# Patient Record
Sex: Female | Born: 1950 | Race: Black or African American | Hispanic: No | Marital: Married | Smoking: Former smoker
Health system: Southern US, Community
[De-identification: ages and names within clinical notes are randomized; demographics above are authoritative.]

## PROBLEM LIST (undated history)

## (undated) DIAGNOSIS — I10 Essential (primary) hypertension: Secondary | ICD-10-CM

## (undated) DIAGNOSIS — A879 Viral meningitis, unspecified: Secondary | ICD-10-CM

## (undated) DIAGNOSIS — I671 Cerebral aneurysm, nonruptured: Secondary | ICD-10-CM

## (undated) DIAGNOSIS — T884XXA Failed or difficult intubation, initial encounter: Secondary | ICD-10-CM

## (undated) DIAGNOSIS — C3491 Malignant neoplasm of unspecified part of right bronchus or lung: Secondary | ICD-10-CM

## (undated) HISTORY — DX: Malignant neoplasm of unspecified part of right bronchus or lung: C34.91

## (undated) HISTORY — PX: WISDOM TOOTH EXTRACTION: SHX21

## (undated) HISTORY — PX: OOPHORECTOMY: SHX86

---
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
## 2008-11-06 ENCOUNTER — Ambulatory Visit: Payer: Self-pay | Admitting: Family Medicine

## 2010-06-30 ENCOUNTER — Ambulatory Visit: Payer: Self-pay | Admitting: Family Medicine

## 2010-11-28 ENCOUNTER — Ambulatory Visit: Payer: Self-pay | Admitting: Family Medicine

## 2012-04-05 ENCOUNTER — Ambulatory Visit: Payer: Self-pay | Admitting: Family Medicine

## 2012-09-15 DIAGNOSIS — L84 Corns and callosities: Secondary | ICD-10-CM | POA: Insufficient documentation

## 2012-09-15 DIAGNOSIS — I10 Essential (primary) hypertension: Secondary | ICD-10-CM | POA: Insufficient documentation

## 2013-02-04 ENCOUNTER — Emergency Department: Payer: Self-pay | Admitting: Emergency Medicine

## 2013-02-04 LAB — CBC
HCT: 35.9 % (ref 35.0–47.0)
HGB: 12.3 g/dL (ref 12.0–16.0)
MCH: 34.4 pg — ABNORMAL HIGH (ref 26.0–34.0)
MCV: 100 fL (ref 80–100)
Platelet: 276 10*3/uL (ref 150–440)
RDW: 12.6 % (ref 11.5–14.5)

## 2013-02-04 LAB — COMPREHENSIVE METABOLIC PANEL
Albumin: 3.8 g/dL (ref 3.4–5.0)
BUN: 12 mg/dL (ref 7–18)
Bilirubin,Total: 0.3 mg/dL (ref 0.2–1.0)
Calcium, Total: 8.9 mg/dL (ref 8.5–10.1)
Chloride: 107 mmol/L (ref 98–107)
Creatinine: 0.82 mg/dL (ref 0.60–1.30)
EGFR (African American): >60
EGFR (Non-African Amer.): >60
Osmolality: 276 (ref 275–301)
SGOT(AST): 28 U/L (ref 15–37)
SGPT (ALT): 36 U/L (ref 12–78)
Total Protein: 7.3 g/dL (ref 6.4–8.2)

## 2013-04-24 DIAGNOSIS — E7800 Pure hypercholesterolemia, unspecified: Secondary | ICD-10-CM | POA: Insufficient documentation

## 2013-08-01 DIAGNOSIS — E785 Hyperlipidemia, unspecified: Secondary | ICD-10-CM | POA: Insufficient documentation

## 2016-05-05 ENCOUNTER — Other Ambulatory Visit: Payer: Self-pay | Admitting: Family Medicine

## 2016-05-05 DIAGNOSIS — Z780 Asymptomatic menopausal state: Secondary | ICD-10-CM

## 2016-07-19 ENCOUNTER — Emergency Department: Payer: Medicare HMO

## 2016-07-19 ENCOUNTER — Emergency Department
Admission: EM | Admit: 2016-07-19 | Discharge: 2016-07-19 | Disposition: A | Discharge status: Home / Self Care | Payer: Medicare HMO | Attending: Emergency Medicine | Admitting: Emergency Medicine

## 2016-07-19 ENCOUNTER — Encounter: Payer: Self-pay | Admitting: Emergency Medicine

## 2016-07-19 DIAGNOSIS — Y9301 Activity, walking, marching and hiking: Secondary | ICD-10-CM | POA: Insufficient documentation

## 2016-07-19 DIAGNOSIS — S0083XA Contusion of other part of head, initial encounter: Secondary | ICD-10-CM | POA: Insufficient documentation

## 2016-07-19 DIAGNOSIS — Y998 Other external cause status: Secondary | ICD-10-CM | POA: Diagnosis not present

## 2016-07-19 DIAGNOSIS — W108XXA Fall (on) (from) other stairs and steps, initial encounter: Secondary | ICD-10-CM | POA: Insufficient documentation

## 2016-07-19 DIAGNOSIS — F17200 Nicotine dependence, unspecified, uncomplicated: Secondary | ICD-10-CM | POA: Insufficient documentation

## 2016-07-19 DIAGNOSIS — S0990XA Unspecified injury of head, initial encounter: Secondary | ICD-10-CM | POA: Diagnosis present

## 2016-07-19 DIAGNOSIS — Y9222 Religious institution as the place of occurrence of the external cause: Secondary | ICD-10-CM | POA: Insufficient documentation

## 2016-07-19 HISTORY — DX: Viral meningitis, unspecified: A87.9

## 2016-07-19 HISTORY — DX: Cerebral aneurysm, nonruptured: I67.1

## 2016-07-19 NOTE — ED Provider Notes (Signed)
[facility] Emergency Department Provider Note   ____________________________________________   First MD Initiated Contact with Patient 07/19/16 1801     (approximate)  I have reviewed the triage vital signs and the nursing notes.   HISTORY  Chief Complaint Fall    HPI Connie Powers is a 66 y.o. female here for evaluation of head injury. Patient was walking up steps holding to food trays, she could not see the steps and tripped on the last one. She fell forward and struck the wall leaving a small indentation in the wall, with swelling and tenderness over her right forehead  Did not lose consciousness. No neck pain. Take aspirin daily. No other blood thinners. No numbness tingling or weakness. No nausea or vomiting. Denies having any headache at this time, reports she just feels sore over the frontal forehead were swollen. No bleeding  No preceding seeding symptoms, no chest pain or palpitations. No trouble breathing  Headache previously diagnosed aneurysm, which was repaired surgically   Past Medical History:  Diagnosis Date  . Brain aneurysm   . Viral meningitis     There are no active problems to display for this patient.   Past Surgical History:  Procedure Laterality Date  . ABDOMINAL HYSTERECTOMY      Prior to Admission medications   Not on File    Allergies Patient has no known allergies.  No family history on file.  Social History Social History  Substance Use Topics  . Smoking status: Current Every Day Smoker  . Smokeless tobacco: Never Used  . Alcohol use Yes     Comment: social    Review of Systems Constitutional: No fever/chills Eyes: No visual changes. ENT: No sore throat. Cardiovascular: Denies chest pain. Respiratory: Denies shortness of breath. Gastrointestinal: No abdominal pain.  No nausea, no vomiting.  No diarrhea.  No constipation. Musculoskeletal: Negative for back pain. No neck pain or other injury to  the arms or legs. Skin: Negative for rash. Neurological: Negative for focal weakness or numbness.  10-point ROS otherwise negative.  ____________________________________________   PHYSICAL EXAM:  VITAL SIGNS: ED Triage Vitals  Enc Vitals Group     BP 07/19/16 1614 (!) 193/70     Pulse Rate 07/19/16 1614 80     Resp 07/19/16 1614 18     Temp 07/19/16 1614 98.6 F (37 C)     Temp Source 07/19/16 1614 Oral     SpO2 07/19/16 1614 96 %     Weight 07/19/16 1615 170 lb (77.1 kg)     Height 07/19/16 1615 '5\' 4"'$  (1.626 m)     Head Circumference --      Peak Flow --      Pain Score 07/19/16 1620 4     Pain Loc --      Pain Edu? --      Excl. in [place]? --    Systolic blood pressure 778E, on recheck prior her to discharge  Constitutional: Alert and oriented. Well appearing and in no acute distress. Eyes: Conjunctivae are normal. PERRL. EOMI. Head: Atraumatic except for a mild edema over the right forehead. Nose: No congestion/rhinnorhea. Mouth/Throat: Mucous membranes are moist.  Oropharynx non-erythematous. Neck: No stridor.  No cervical tenderness Cardiovascular: Normal rate, regular rhythm. Grossly normal heart sounds.  Good peripheral circulation. Respiratory: Normal respiratory effort.  No retractions. Lungs CTAB. Gastrointestinal: Soft and nontender. No distention.  Moves all extremities with 5 over 5 strength without pain. Musculoskeletal: No lower extremity tenderness  nor edema.  No joint effusions. Neurologic:  Normal speech and language. No gross focal neurologic deficits are appreciated. No gait instability. Skin:  Skin is warm, dry and intact. No rash noted. Psychiatric: Mood and affect are normal. Speech and behavior are normal.  ____________________________________________   LABS (all labs ordered are listed, but only abnormal results are displayed)  Labs Reviewed - No data to  display ____________________________________________  EKG   ____________________________________________  RADIOLOGY  Ct Head Wo Contrast  Result Date: 07/19/2016 CLINICAL DATA:  66 year old female status post fall on stairs, struck right forehead. Initial encounter. Personal history of intracranial aneurysm clipping. EXAM: CT HEAD WITHOUT CONTRAST TECHNIQUE: Contiguous axial images were obtained from the base of the skull through the vertex without intravenous contrast. COMPARISON:  Neck CT 02/04/2013. FINDINGS: Brain: No midline shift, mass effect, or evidence of intracranial mass lesion. No acute intracranial hemorrhage identified. No ventriculomegaly. Unusual appearing but stable surgical clip type device at the left ICA terminus. There is a surgical clip or metallic object also just inside the inner table of the skull at the posterior craniotomy site. No cortically based acute infarct identified. Left anterior temporal lobe chronic encephalomalacia. Vascular: Mild Calcified atherosclerosis at the skull base. Skull: Previous left frontotemporal craniotomy. No acute osseous abnormality identified. Sinuses/Orbits: Mild paranasal sinus mucosal thickening. Tympanic cavities and mastoids are clear. Other: Broad-based right forehead scalp hematoma measuring up to 7 mm in thickness. Underlying right frontal bone intact. Negative visible orbits soft tissues. Dense surgical clip or retained metallic foreign body along the anterior left external auditory canal. No other acute scalp soft tissue finding. IMPRESSION: 1. No acute traumatic injury to the brain identified. 2. Sequelae of left ICA terminus region aneurysm surgery with left anterior temporal lobe encephalomalacia. 3. Right scalp hematoma without underlying fracture. Electronically Signed   By: Genevie Ann M.D.   On: 07/19/2016 17:00    ____________________________________________   PROCEDURES  Procedure(s) performed: None  Procedures  Critical  Care performed: No  ____________________________________________   INITIAL IMPRESSION / ASSESSMENT AND PLAN / ED COURSE  Pertinent labs & imaging results that were available during my care of the patient were reviewed by me and considered in my medical decision making (see chart for details).  Fall, mechanical. Struck right forehead. CT scan negative for acute injury, other than small scalp hematoma. Patient awake alert, no signs or symptoms of concussion or major injury.  Return precautions and treatment recommendations and follow-up discussed with the patient who is agreeable with the plan.       ____________________________________________   FINAL CLINICAL IMPRESSION(S) / ED DIAGNOSES  Final diagnoses:  Forehead contusion, initial encounter      NEW MEDICATIONS STARTED DURING THIS VISIT:  New Prescriptions   No medications on file     Note:  This document was prepared using Dragon voice recognition software and may include unintentional dictation errors.     Delman Kitten, MD 07/19/16 [phone]

## 2016-07-19 NOTE — Discharge Instructions (Signed)
You were seen in the Emergency Department (ED) today for a head injury.  Based on your evaluation, you may have sustained a concussion (or bruise) to your brain.  If you had a CT scan done, it did not show any evidence of serious injury or bleeding.    Return to the emergency department or follow-up with your primary care doctor if your symptoms are not improving over this time.  Signs of a more serious head injury include vomiting, severe headache, excessive sleepiness or confusion, fever, and weakness or numbness in your face, arms or legs.  Return immediately to the Emergency Department if you experience any of these more concerning symptoms.

## 2016-07-19 NOTE — ED Triage Notes (Addendum)
Pt states that she was walking [place] the steps today at [employer] and fell  hitting her head on the wall. Patient denies LOC, Nausea or vomiting. Pt has hematoma on the right side of her forehead. Pt denies headache. Pt takes 81 mg ASA.

## 2016-07-28 ENCOUNTER — Other Ambulatory Visit: Payer: Self-pay | Admitting: Family Medicine

## 2016-07-28 DIAGNOSIS — Z1231 Encounter for screening mammogram for malignant neoplasm of breast: Secondary | ICD-10-CM

## 2016-08-11 ENCOUNTER — Ambulatory Visit
Admission: RE | Admit: 2016-08-11 | Discharge: 2016-08-11 | Disposition: A | Discharge status: Home / Self Care | Payer: Medicare HMO | Source: Ambulatory Visit | Attending: Family Medicine | Admitting: Family Medicine

## 2016-08-11 DIAGNOSIS — Z780 Asymptomatic menopausal state: Secondary | ICD-10-CM | POA: Diagnosis present

## 2016-08-11 DIAGNOSIS — Z1231 Encounter for screening mammogram for malignant neoplasm of breast: Secondary | ICD-10-CM | POA: Diagnosis present

## 2017-04-30 ENCOUNTER — Encounter: Payer: Self-pay | Admitting: *Deleted

## 2017-05-03 ENCOUNTER — Ambulatory Visit
Admission: RE | Admit: 2017-05-03 | Discharge: 2017-05-03 | Disposition: A | Discharge status: Home / Self Care | Payer: Medicare HMO | Source: Ambulatory Visit | Attending: Unknown Physician Specialty | Admitting: Unknown Physician Specialty

## 2017-05-03 ENCOUNTER — Encounter
Admission: RE | Disposition: A | Discharge status: Home / Self Care | Payer: Self-pay | Source: Ambulatory Visit | Attending: Unknown Physician Specialty

## 2017-05-03 DIAGNOSIS — Z1211 Encounter for screening for malignant neoplasm of colon: Secondary | ICD-10-CM | POA: Diagnosis not present

## 2017-05-03 DIAGNOSIS — Z538 Procedure and treatment not carried out for other reasons: Secondary | ICD-10-CM | POA: Diagnosis not present

## 2017-05-03 LAB — URINE DRUG SCREEN, QUALITATIVE (ARMC ONLY)
Amphetamines, Ur Screen: NOT DETECTED
BARBITURATES, UR SCREEN: NOT DETECTED
BENZODIAZEPINE, UR SCRN: NOT DETECTED
CANNABINOID 50 NG, UR ~~LOC~~: NOT DETECTED
Cocaine Metabolite,Ur ~~LOC~~: POSITIVE — AB
MDMA (Ecstasy)Ur Screen: NOT DETECTED
METHADONE SCREEN, URINE: NOT DETECTED
Opiate, Ur Screen: NOT DETECTED
Phencyclidine (PCP) Ur S: NOT DETECTED
TRICYCLIC, UR SCREEN: NOT DETECTED

## 2017-05-03 SURGERY — COLONOSCOPY WITH PROPOFOL
Anesthesia: General

## 2017-05-03 MED ORDER — SODIUM CHLORIDE 0.9 % IV SOLN: INTRAVENOUS | Status: DC

## 2017-07-28 ENCOUNTER — Other Ambulatory Visit: Payer: Self-pay | Admitting: Specialist

## 2017-07-28 DIAGNOSIS — R918 Other nonspecific abnormal finding of lung field: Secondary | ICD-10-CM

## 2017-08-09 ENCOUNTER — Encounter
Admission: RE | Admit: 2017-08-09 | Discharge: 2017-08-09 | Disposition: A | Discharge status: Home / Self Care | Payer: Medicare HMO | Source: Ambulatory Visit | Attending: Specialist | Admitting: Specialist

## 2017-08-09 DIAGNOSIS — R918 Other nonspecific abnormal finding of lung field: Secondary | ICD-10-CM | POA: Diagnosis present

## 2017-08-09 LAB — GLUCOSE, CAPILLARY: Glucose-Capillary: 107 mg/dL — ABNORMAL HIGH (ref 65–99)

## 2017-08-09 MED ORDER — FLUDEOXYGLUCOSE F - 18 (FDG) INJECTION
9.1000 | Freq: Once | INTRAVENOUS | Status: AC | PRN
  Administered 2017-08-09: 9.06 via INTRAVENOUS

## 2017-09-02 ENCOUNTER — Other Ambulatory Visit
Admission: RE | Admit: 2017-09-02 | Discharge: 2017-09-02 | Disposition: A | Discharge status: Home / Self Care | Payer: Medicare HMO | Source: Ambulatory Visit | Attending: Internal Medicine | Admitting: Internal Medicine

## 2017-09-02 ENCOUNTER — Other Ambulatory Visit: Payer: Self-pay | Admitting: Internal Medicine

## 2017-09-02 ENCOUNTER — Ambulatory Visit: Payer: Medicare HMO | Admitting: Internal Medicine

## 2017-09-02 ENCOUNTER — Encounter: Payer: Self-pay | Admitting: Internal Medicine

## 2017-09-02 VITALS — BP 126/70 | HR 93 | Resp 16 | Ht 64.0 in | Wt 180.0 lb

## 2017-09-02 DIAGNOSIS — R590 Localized enlarged lymph nodes: Secondary | ICD-10-CM

## 2017-09-02 DIAGNOSIS — F17210 Nicotine dependence, cigarettes, uncomplicated: Secondary | ICD-10-CM

## 2017-09-02 NOTE — Patient Instructions (Addendum)
Will set up a bronchoscopy to perform a lung biopsy.  Your risks with the procedure will be lower if you stop smoking.   --Quitting smoking is the most important thing that you can do for your health.  --Quitting smoking will have greater affect on your health than any medicine that we can give you.

## 2017-09-02 NOTE — Progress Notes (Signed)
[name]      Assessment and Plan:  Mediastinal lymphadenopathy with lung nodules. -Right paratracheal lymphadenopathy, will plan for EBUS bronchoscopy, and lower lobe washings. -We will send for ACE level, Anka, rheumatoid factor. -Follow-up 1 week after procedure for results.  Nicotine abuse. - Discussed importance of smoke cessation, spent greater than 3 minutes in discussion. -Discussed that continued smoking will increase her risk of complications with bronchoscopy.   Date: 09/02/2017  MRN# [id number] Connie Powers November 03, 1950    Connie Powers is a 67 y.o. old female seen in consultation for chief complaint of:    Chief Complaint  Patient presents with  . Consult    Referred by Dr. Raul Del for eval bronchoscopy.  . Cough    white mucus  . Shortness of Breath    with exertion  . Wheezing    HPI:   The patient is a 67 year old female she follows with Dr. Raul Del she has a history of smoking with moderate COPD.  She was found to have a few small nodules seen on CT, as well as a suspicious area of 2 cm mediastinal lymphadenopathy. Her primary doctor noted that she was wheezing and thus referred to pulmonary. She denies weight loss, she has occasional cough, some mucus, no hemoptysis.  She has occasional heartburn, controlled by diet. Denies sinus drainage. Denies rash. No family history of sarcoid.  She has occasional dyspnea, but not often. She can push her husband in a wheelchair at the hospital and be ok. Other times she has dyspnea walking in her home, she does not use inhalers.  She is not diabetic no cardiac problems.  She has had a colonoscopy, and mammograms.   She is smoking less than half a ppd. She has never quit before, but she is now down to 4 cigs per day, and is trying to quit by cutting down. She used to smoke a ppd.   Imaging personally reviewed CT/PET scan 08/09/17; there is a area of right paratracheal  lymphadenopathy elevated SUV of 7.1 lungs show mildly reduced volume, mild bibasilar atelectasis.   PMHX:   Past Medical History:  Diagnosis Date  . Brain aneurysm   . Viral meningitis    Surgical Hx:  Past Surgical History:  Procedure Laterality Date  . OOPHORECTOMY Left   . surgical repair for brain tumor     Family Hx:  Family History  Problem Relation Age of Onset  . Breast cancer Neg Hx    Social Hx:   Social History   Tobacco Use  . Smoking status: Current Every Day Smoker  . Smokeless tobacco: Never Used  Substance Use Topics  . Alcohol use: Yes    Comment: social  . Drug use: Not on file   Medication:    Current Outpatient Medications:  .  aspirin EC 81 MG tablet, Take 81 mg by mouth daily., Disp: , Rfl:  .  umeclidinium-vilanterol (ANORO ELLIPTA) 62.5-25 MCG/INH AEPB, Inhale 1 puff into the lungs daily., Disp: , Rfl:    Allergies:  Patient has no known allergies.  Review of Systems: Gen:  Denies  fever, sweats, chills HEENT: Denies blurred vision, double vision. bleeds, sore throat Cvc:  No dizziness, chest pain. Resp:   Denies cough or sputum production, shortness of breath Gi: Denies swallowing difficulty, stomach pain. Gu:  Denies bladder incontinence, burning urine Ext:   No Joint pain, stiffness. Skin: No skin rash,  hives  Endoc:  No polyuria, polydipsia. Psych:  No depression, insomnia. Other:  All other systems were reviewed with the patient and were negative other that what is mentioned in the HPI.   Physical Examination:   VS: BP 126/70 (BP Location: Left Arm, Cuff Size: Normal)   Pulse 93   Resp 16   Ht 5\' 4"  (1.626 m)   Wt 180 lb (81.6 kg)   SpO2 99%   BMI 30.90 kg/m   General Appearance: No distress  Neuro:without focal findings,  speech normal,  HEENT: PERRLA, EOM intact.   Pulmonary: normal breath sounds, No wheezing.  CardiovascularNormal S1,S2.  No m/r/g.   Abdomen: Benign, Soft, non-tender. Renal:  No costovertebral  tenderness  GU:  No performed at this time. Endoc: No evident thyromegaly, no signs of acromegaly. Skin:   warm, no rashes, no ecchymosis  Extremities: normal, no cyanosis, clubbing.  Other findings:    LABORATORY PANEL:   CBC No results for input(s): WBC, HGB, HCT, PLT in the last 168 hours. ------------------------------------------------------------------------------------------------------------------  Chemistries  No results for input(s): NA, K, CL, CO2, GLUCOSE, BUN, CREATININE, CALCIUM, MG, AST, ALT, ALKPHOS, BILITOT in the last 168 hours.  Invalid input(s): GFRCGP ------------------------------------------------------------------------------------------------------------------  Cardiac Enzymes No results for input(s): TROPONINI in the last 168 hours. ------------------------------------------------------------  RADIOLOGY:  No results found.     Thank  you for the consultation and for allowing [facility] to assist in the care of your patient. Our recommendations are noted above.  Please contact us if we can be of further service.   Marda Stalker, MD.  Board Certified in Internal Medicine, Pulmonary Medicine, [place], and Sleep Medicine.  [facility] Office Number: [phone]  Patricia Pesa, M.D.  Merton Border, M.D  09/02/2017

## 2017-09-02 NOTE — H&P (View-Only) (Signed)
[facility] Consultation      Assessment and Plan:  Mediastinal lymphadenopathy with lung nodules. -Right paratracheal lymphadenopathy, will plan for EBUS bronchoscopy, and lower lobe washings. -We will send for ACE level, Anka, rheumatoid factor. -Follow-up 1 week after procedure for results.  Nicotine abuse. - Discussed importance of smoke cessation, spent greater than 3 minutes in discussion. -Discussed that continued smoking will increase her risk of complications with bronchoscopy.   Date: 09/02/2017  MRN# [id number] Connie Powers [birth year]    Connie Powers is a 67 y.o. old female seen in consultation for chief complaint of:    Chief Complaint  Patient presents with  . Consult    Referred by Dr. Raul Del for eval bronchoscopy.  . Cough    white mucus  . Shortness of Breath    with exertion  . Wheezing    HPI:   The patient is a 67 year old female she follows with Dr. Raul Del she has a history of smoking with moderate COPD.  She was found to have a few small nodules seen on CT, as well as a suspicious area of 2 cm mediastinal lymphadenopathy. Her primary doctor noted that she was wheezing and thus referred to pulmonary. She denies weight loss, she has occasional cough, some mucus, no hemoptysis.  She has occasional heartburn, controlled by diet. Denies sinus drainage. Denies rash. No family history of sarcoid.  She has occasional dyspnea, but not often. She can push her husband in a wheelchair at the hospital and be ok. Other times she has dyspnea walking in her home, she does not use inhalers.  She is not diabetic no cardiac problems.  She has had a colonoscopy, and mammograms.   She is smoking less than half a ppd. She has never quit before, but she is now down to 4 cigs per day, and is trying to quit by cutting down. She used to smoke a ppd.   Imaging personally reviewed CT/PET scan 08/09/17; there is a area of right paratracheal  lymphadenopathy elevated SUV of 7.1 lungs show mildly reduced volume, mild bibasilar atelectasis.   PMHX:   Past Medical History:  Diagnosis Date  . Brain aneurysm   . Viral meningitis    Surgical Hx:  Past Surgical History:  Procedure Laterality Date  . OOPHORECTOMY Left   . surgical repair for brain tumor     Family Hx:  Family History  Problem Relation Age of Onset  . Breast cancer Neg Hx    Social Hx:   Social History   Tobacco Use  . Smoking status: Current Every Day Smoker  . Smokeless tobacco: Never Used  Substance Use Topics  . Alcohol use: Yes    Comment: social  . Drug use: Not on file   Medication:    Current Outpatient Medications:  .  aspirin EC 81 MG tablet, Take 81 mg by mouth daily., Disp: , Rfl:  .  umeclidinium-vilanterol (ANORO ELLIPTA) 62.5-25 MCG/INH AEPB, Inhale 1 puff into the lungs daily., Disp: , Rfl:    Allergies:  Patient has no known allergies.  Review of Systems: Gen:  Denies  fever, sweats, chills HEENT: Denies blurred vision, double vision. bleeds, sore throat Cvc:  No dizziness, chest pain. Resp:   Denies cough or sputum production, shortness of breath Gi: Denies swallowing difficulty, stomach pain. Gu:  Denies bladder incontinence, burning urine Ext:   No Joint pain, stiffness. Skin: No skin rash,  hives  Endoc:  No polyuria, polydipsia. Psych:  No depression, insomnia. Other:  All other systems were reviewed with the patient and were negative other that what is mentioned in the HPI.   Physical Examination:   VS: BP 126/70 (BP Location: Left Arm, Cuff Size: Normal)   Pulse 93   Resp 16   Ht 5\' 4"  (1.626 m)   Wt 180 lb (81.6 kg)   SpO2 99%   BMI 30.90 kg/m   General Appearance: No distress  Neuro:without focal findings,  speech normal,  HEENT: PERRLA, EOM intact.   Pulmonary: normal breath sounds, No wheezing.  CardiovascularNormal S1,S2.  No m/r/g.   Abdomen: Benign, Soft, non-tender. Renal:  No costovertebral  tenderness  GU:  No performed at this time. Endoc: No evident thyromegaly, no signs of acromegaly. Skin:   warm, no rashes, no ecchymosis  Extremities: normal, no cyanosis, clubbing.  Other findings:    LABORATORY PANEL:   CBC No results for input(s): WBC, HGB, HCT, PLT in the last 168 hours. ------------------------------------------------------------------------------------------------------------------  Chemistries  No results for input(s): NA, K, CL, CO2, GLUCOSE, BUN, CREATININE, CALCIUM, MG, AST, ALT, ALKPHOS, BILITOT in the last 168 hours.  Invalid input(s): GFRCGP ------------------------------------------------------------------------------------------------------------------  Cardiac Enzymes No results for input(s): TROPONINI in the last 168 hours. ------------------------------------------------------------  RADIOLOGY:  No results found.     Thank  you for the consultation and for allowing [facility] to assist in the care of your patient. Our recommendations are noted above.  Please contact us if we can be of further service.   Marda Stalker, MD.  Board Certified in Internal Medicine, Pulmonary Medicine, [place], and Sleep Medicine.  [facility] Office Number: [phone]  Patricia Pesa, M.D.  Merton Border, M.D  09/02/2017

## 2017-09-03 ENCOUNTER — Telehealth: Payer: Self-pay | Admitting: *Deleted

## 2017-09-03 LAB — ANGIOTENSIN CONVERTING ENZYME: Angiotensin-Converting Enzyme: 32 U/L (ref 14–82)

## 2017-09-03 NOTE — Telephone Encounter (Signed)
EBUS scheduled for 09/09/2017 DX: mediastinal lymphadenopathy Provider:Ramachandran

## 2017-09-03 NOTE — Telephone Encounter (Signed)
Called and spoke with Quillian Quince at [facility]. Procedure code [phone] and [phone] is a valid and billable code which does not require PA.  Call Ref # [id number]. Rhonda J Cobb

## 2017-09-06 NOTE — Telephone Encounter (Signed)
LMOM for pt to return call to let her know that her PAT appt is 09/07/17 @1pm .

## 2017-09-07 ENCOUNTER — Other Ambulatory Visit: Payer: Self-pay

## 2017-09-07 ENCOUNTER — Encounter
Admission: RE | Admit: 2017-09-07 | Discharge: 2017-09-07 | Disposition: A | Discharge status: Home / Self Care | Payer: Medicare HMO | Source: Ambulatory Visit | Attending: Internal Medicine | Admitting: Internal Medicine

## 2017-09-07 DIAGNOSIS — Z79899 Other long term (current) drug therapy: Secondary | ICD-10-CM | POA: Diagnosis not present

## 2017-09-07 DIAGNOSIS — F17210 Nicotine dependence, cigarettes, uncomplicated: Secondary | ICD-10-CM | POA: Diagnosis not present

## 2017-09-07 DIAGNOSIS — Z8679 Personal history of other diseases of the circulatory system: Secondary | ICD-10-CM | POA: Diagnosis not present

## 2017-09-07 DIAGNOSIS — R590 Localized enlarged lymph nodes: Secondary | ICD-10-CM | POA: Diagnosis present

## 2017-09-07 DIAGNOSIS — C771 Secondary and unspecified malignant neoplasm of intrathoracic lymph nodes: Secondary | ICD-10-CM | POA: Diagnosis not present

## 2017-09-07 DIAGNOSIS — Z7982 Long term (current) use of aspirin: Secondary | ICD-10-CM | POA: Diagnosis not present

## 2017-09-07 DIAGNOSIS — I739 Peripheral vascular disease, unspecified: Secondary | ICD-10-CM | POA: Diagnosis not present

## 2017-09-07 DIAGNOSIS — J449 Chronic obstructive pulmonary disease, unspecified: Secondary | ICD-10-CM | POA: Diagnosis not present

## 2017-09-07 LAB — BASIC METABOLIC PANEL
ANION GAP: 7 (ref 5–15)
BUN: 6 mg/dL (ref 6–20)
CALCIUM: 8.8 mg/dL — AB (ref 8.9–10.3)
CO2: 26 mmol/L (ref 22–32)
Chloride: 107 mmol/L (ref 101–111)
Creatinine, Ser: 0.49 mg/dL (ref 0.44–1.00)
GFR calc Af Amer: >60 mL/min (ref >60)
Glucose, Bld: 123 mg/dL — ABNORMAL HIGH (ref 65–99)
Potassium: 3.5 mmol/L (ref 3.5–5.1)
SODIUM: 140 mmol/L (ref 135–145)

## 2017-09-07 LAB — CBC
HCT: 38.4 % (ref 35.0–47.0)
Hemoglobin: 13 g/dL (ref 12.0–16.0)
MCH: 35.7 pg — ABNORMAL HIGH (ref 26.0–34.0)
MCHC: 34 g/dL (ref 32.0–36.0)
MCV: 105.1 fL — AB (ref 80.0–100.0)
PLATELETS: 247 10*3/uL (ref 150–440)
RBC: 3.65 MIL/uL — AB (ref 3.80–5.20)
RDW: 13.3 % (ref 11.5–14.5)
WBC: 4.6 10*3/uL (ref 3.6–11.0)

## 2017-09-07 NOTE — Patient Instructions (Signed)
Your procedure is scheduled on: Thursday, April 11TH  Report to [facility]  To find out your arrival time please call [phone] between               1PM - 3PM on Wednesday, April 10TH  Remember: Instructions that are not followed completely may result in serious  medical risk, up to and including death, or upon the discretion of your surgeon  and anesthesiologist your surgery may need to be rescheduled.     _X__ 1. Do not eat food after midnight the night before your procedure.                 No gum chewing or hard candies.                  You may drink clear liquids up to 2 hours                 before you are scheduled to arrive for your surgery-                   DO not drink clear liquids within 2 hours of the start of your surgery.                  Clear Liquids include:  water, apple juice without pulp, clear carbohydrate                 drink such as Clearfast of Gartorade, Black Coffee or Tea (Do not add                 anything to coffee or tea).  __X__2.  On the morning of surgery brush your teeth with toothpaste and water,                       yoU may rinse your mouth with mouthwash if you wish.                            Do not swallow any  toothpaste of mouthwash.     _X__ 3.  No Alcohol for 24 hours before or after surgery.   _X__ 4.  Do Not Smoke or use e-cigarettes For 24 Hours Prior to Your Surgery.                 Do not use any chewable tobacco products for at least 6 hours prior to                 surgery.  ____  5.  Bring all medications with you on the day of surgery if instructed.   ____  6.  Notify your doctor if there is any change in your medical condition      (cold, fever, infections).     Do not wear jewelry, make-up, hairpins, clips or nail polish. Do not wear lotions, powders, or perfumes. You may wear deodorant. Do not shave 48 hours prior to surgery. Men may shave face and neck. Do not  bring valuables to the hospital.    [facility] is not responsible for any belongings or valuables.  Contacts, dentures or bridgework may not be worn into surgery. Leave your suitcase in the car. After surgery it may be brought to your room. For patients admitted to the hospital, discharge time is determined by your treatment team.   Patients discharged the day  of surgery will not be allowed to drive home.   Please read over the following fact sheets that you were given:   Preparing for surgery     ____ Take these medicines the morning of surgery with A SIP OF WATER:    1. Anoro inhaler.  Use in the morning and bring with you to the hospital  2.   3.   4.  5.  6.  ____ Fleet Enema (as directed)   _x___ TAKE A SHOWER IN THE MORNING BEFORE COMING TO [place]  _x___ Use inhalers on the day of surgery  ____ Stop ASPIRIN NOW!!  ____ Stop Anti-inflammatories NOW!!   ____ Stop supplements until after surgery.    ____ Bring C-Pap to the hospital.   [place]

## 2017-09-07 NOTE — Telephone Encounter (Signed)
Patient returning call  Confirmed appt for PAT 09/07/17 at 1pm

## 2017-09-07 NOTE — Telephone Encounter (Signed)
Called pt back to make sure she was informed where she needed to go for PAT and to take her meds with her.

## 2017-09-07 NOTE — Telephone Encounter (Signed)
LMTCB in regards to pre-assessment today at 1pm.

## 2017-09-08 NOTE — Care Management (Signed)
EKG reviewed and ok. No further consults needed.

## 2017-09-09 ENCOUNTER — Ambulatory Visit: Payer: Medicare HMO | Admitting: Anesthesiology

## 2017-09-09 ENCOUNTER — Encounter
Admission: RE | Disposition: A | Discharge status: Home / Self Care | Payer: Self-pay | Source: Ambulatory Visit | Attending: Internal Medicine

## 2017-09-09 ENCOUNTER — Other Ambulatory Visit: Payer: Self-pay

## 2017-09-09 ENCOUNTER — Ambulatory Visit
Admission: RE | Admit: 2017-09-09 | Discharge: 2017-09-09 | Disposition: A | Discharge status: Home / Self Care | Payer: Medicare HMO | Source: Ambulatory Visit | Attending: Internal Medicine | Admitting: Internal Medicine

## 2017-09-09 DIAGNOSIS — C771 Secondary and unspecified malignant neoplasm of intrathoracic lymph nodes: Secondary | ICD-10-CM | POA: Diagnosis not present

## 2017-09-09 DIAGNOSIS — F17210 Nicotine dependence, cigarettes, uncomplicated: Secondary | ICD-10-CM | POA: Insufficient documentation

## 2017-09-09 DIAGNOSIS — Z8679 Personal history of other diseases of the circulatory system: Secondary | ICD-10-CM | POA: Insufficient documentation

## 2017-09-09 DIAGNOSIS — R590 Localized enlarged lymph nodes: Secondary | ICD-10-CM

## 2017-09-09 DIAGNOSIS — J449 Chronic obstructive pulmonary disease, unspecified: Secondary | ICD-10-CM | POA: Insufficient documentation

## 2017-09-09 DIAGNOSIS — Z7982 Long term (current) use of aspirin: Secondary | ICD-10-CM | POA: Insufficient documentation

## 2017-09-09 DIAGNOSIS — Z79899 Other long term (current) drug therapy: Secondary | ICD-10-CM | POA: Insufficient documentation

## 2017-09-09 DIAGNOSIS — I739 Peripheral vascular disease, unspecified: Secondary | ICD-10-CM | POA: Insufficient documentation

## 2017-09-09 HISTORY — PX: ENDOBRONCHIAL ULTRASOUND: SHX5096

## 2017-09-09 HISTORY — DX: Failed or difficult intubation, initial encounter: T88.4XXA

## 2017-09-09 LAB — URINE DRUG SCREEN, QUALITATIVE (ARMC ONLY)
Amphetamines, Ur Screen: NOT DETECTED
Barbiturates, Ur Screen: NOT DETECTED
Benzodiazepine, Ur Scrn: NOT DETECTED
CANNABINOID 50 NG, UR ~~LOC~~: NOT DETECTED
COCAINE METABOLITE, UR ~~LOC~~: NOT DETECTED
MDMA (Ecstasy)Ur Screen: NOT DETECTED
Methadone Scn, Ur: NOT DETECTED
OPIATE, UR SCREEN: NOT DETECTED
Phencyclidine (PCP) Ur S: NOT DETECTED
TRICYCLIC, UR SCREEN: NOT DETECTED

## 2017-09-09 SURGERY — ENDOBRONCHIAL ULTRASOUND (EBUS)
Anesthesia: General

## 2017-09-09 MED ORDER — HYDRALAZINE HCL 20 MG/ML IJ SOLN: 10.0000 mg | Freq: Once | INTRAMUSCULAR | Status: DC

## 2017-09-09 MED ORDER — FENTANYL CITRATE (PF) 100 MCG/2ML IJ SOLN
INTRAMUSCULAR | Status: AC
Start: 2017-09-09 — End: 2017-09-09
  Filled 2017-09-09: qty 2

## 2017-09-09 MED ORDER — LACTATED RINGERS IV SOLN
INTRAVENOUS | Status: DC
  Administered 2017-09-09 (×2): via INTRAVENOUS

## 2017-09-09 MED ORDER — ESMOLOL HCL 100 MG/10ML IV SOLN
INTRAVENOUS | Status: AC
  Filled 2017-09-09: qty 10

## 2017-09-09 MED ORDER — MIDAZOLAM HCL 2 MG/2ML IJ SOLN
INTRAMUSCULAR | Status: AC
  Filled 2017-09-09: qty 2

## 2017-09-09 MED ORDER — LIDOCAINE HCL 2 % EX GEL
1.0000 "application " | Freq: Once | CUTANEOUS | Status: DC
  Filled 2017-09-09: qty 5

## 2017-09-09 MED ORDER — PROPOFOL 10 MG/ML IV BOLUS
INTRAVENOUS | Status: DC | PRN
  Administered 2017-09-09: 150 mg via INTRAVENOUS
  Administered 2017-09-09: 50 mg via INTRAVENOUS

## 2017-09-09 MED ORDER — PROPOFOL 10 MG/ML IV BOLUS
INTRAVENOUS | Status: AC
Start: 2017-09-09 — End: 2017-09-09
  Filled 2017-09-09: qty 20

## 2017-09-09 MED ORDER — LIDOCAINE HCL (CARDIAC) 20 MG/ML IV SOLN
INTRAVENOUS | Status: DC | PRN
  Administered 2017-09-09: 100 mg via INTRAVENOUS

## 2017-09-09 MED ORDER — FAMOTIDINE 20 MG PO TABS
20.0000 mg | ORAL_TABLET | Freq: Once | ORAL | Status: AC
  Administered 2017-09-09: 20 mg via ORAL

## 2017-09-09 MED ORDER — FAMOTIDINE 20 MG PO TABS
ORAL_TABLET | ORAL | Status: AC
  Administered 2017-09-09: 20 mg via ORAL
  Filled 2017-09-09: qty 1

## 2017-09-09 MED ORDER — BUTAMBEN-TETRACAINE-BENZOCAINE 2-2-14 % EX AERO
1.0000 | INHALATION_SPRAY | Freq: Once | CUTANEOUS | Status: DC
  Filled 2017-09-09: qty 20

## 2017-09-09 MED ORDER — SEVOFLURANE IN SOLN
RESPIRATORY_TRACT | Status: AC
  Filled 2017-09-09: qty 250

## 2017-09-09 MED ORDER — ROCURONIUM BROMIDE 100 MG/10ML IV SOLN
INTRAVENOUS | Status: DC | PRN
  Administered 2017-09-09: 30 mg via INTRAVENOUS

## 2017-09-09 MED ORDER — DEXAMETHASONE SODIUM PHOSPHATE 10 MG/ML IJ SOLN
INTRAMUSCULAR | Status: DC | PRN
  Administered 2017-09-09: 10 mg via INTRAVENOUS

## 2017-09-09 MED ORDER — SUGAMMADEX SODIUM 200 MG/2ML IV SOLN
INTRAVENOUS | Status: AC
  Filled 2017-09-09: qty 2

## 2017-09-09 MED ORDER — SUCCINYLCHOLINE CHLORIDE 20 MG/ML IJ SOLN
INTRAMUSCULAR | Status: DC | PRN
  Administered 2017-09-09: 120 mg via INTRAVENOUS

## 2017-09-09 MED ORDER — PHENYLEPHRINE HCL 0.25 % NA SOLN
1.0000 | Freq: Four times a day (QID) | NASAL | Status: DC | PRN
  Filled 2017-09-09: qty 15

## 2017-09-09 MED ORDER — LABETALOL HCL 5 MG/ML IV SOLN: 10.0000 mg | Freq: Once | INTRAVENOUS | Status: DC

## 2017-09-09 MED ORDER — FENTANYL CITRATE (PF) 100 MCG/2ML IJ SOLN: 25.0000 ug | INTRAMUSCULAR | Status: DC | PRN

## 2017-09-09 MED ORDER — OXYCODONE HCL 5 MG PO TABS: 5.0000 mg | ORAL_TABLET | Freq: Once | ORAL | Status: DC | PRN

## 2017-09-09 MED ORDER — ONDANSETRON HCL 4 MG/2ML IJ SOLN
INTRAMUSCULAR | Status: DC | PRN
  Administered 2017-09-09: 4 mg via INTRAVENOUS

## 2017-09-09 MED ORDER — FENTANYL CITRATE (PF) 100 MCG/2ML IJ SOLN
INTRAMUSCULAR | Status: DC | PRN
  Administered 2017-09-09 (×2): 50 ug via INTRAVENOUS

## 2017-09-09 MED ORDER — ROCURONIUM BROMIDE 50 MG/5ML IV SOLN
INTRAVENOUS | Status: AC
  Filled 2017-09-09: qty 1

## 2017-09-09 MED ORDER — SUCCINYLCHOLINE CHLORIDE 20 MG/ML IJ SOLN
INTRAMUSCULAR | Status: AC
  Filled 2017-09-09: qty 1

## 2017-09-09 MED ORDER — HYDRALAZINE HCL 20 MG/ML IJ SOLN
INTRAMUSCULAR | Status: AC
  Filled 2017-09-09: qty 1

## 2017-09-09 MED ORDER — SUGAMMADEX SODIUM 200 MG/2ML IV SOLN
INTRAVENOUS | Status: DC | PRN
  Administered 2017-09-09: 170 mg via INTRAVENOUS

## 2017-09-09 MED ORDER — ONDANSETRON HCL 4 MG/2ML IJ SOLN
INTRAMUSCULAR | Status: AC
  Filled 2017-09-09: qty 2

## 2017-09-09 MED ORDER — HYDRALAZINE HCL 20 MG/ML IJ SOLN
10.0000 mg | Freq: Once | INTRAMUSCULAR | Status: AC
  Administered 2017-09-09: 10 mg via INTRAVENOUS

## 2017-09-09 MED ORDER — LIDOCAINE HCL (PF) 1 % IJ SOLN
INTRAMUSCULAR | Status: AC
  Filled 2017-09-09: qty 5

## 2017-09-09 MED ORDER — OXYCODONE HCL 5 MG/5ML PO SOLN: 5.0000 mg | Freq: Once | ORAL | Status: DC | PRN

## 2017-09-09 MED ORDER — DEXAMETHASONE SODIUM PHOSPHATE 10 MG/ML IJ SOLN
INTRAMUSCULAR | Status: AC
  Filled 2017-09-09: qty 1

## 2017-09-09 MED ORDER — BUTAMBEN-TETRACAINE-BENZOCAINE 2-2-14 % EX AERO
INHALATION_SPRAY | CUTANEOUS | Status: AC
  Filled 2017-09-09: qty 5

## 2017-09-09 MED ORDER — ESMOLOL HCL 100 MG/10ML IV SOLN
INTRAVENOUS | Status: DC | PRN
  Administered 2017-09-09 (×3): 10 mg via INTRAVENOUS

## 2017-09-09 MED ORDER — MIDAZOLAM HCL 2 MG/2ML IJ SOLN
INTRAMUSCULAR | Status: DC | PRN
  Administered 2017-09-09: 2 mg via INTRAVENOUS

## 2017-09-09 NOTE — Anesthesia Procedure Notes (Signed)
Procedure Name: Intubation Date/Time: 09/09/2017 1:51 PM Performed by: Aline Brochure, CRNA Pre-anesthesia Checklist: Patient identified, Emergency Drugs available, Suction available and Patient being monitored Patient Re-evaluated:Patient Re-evaluated prior to induction Oxygen Delivery Method: Circle system utilized Preoxygenation: Pre-oxygenation with 100% oxygen Induction Type: IV induction Ventilation: Mask ventilation without difficulty Laryngoscope Size: McGraph and 3 Grade View: Grade I Tube type: Oral Tube size: 8.0 mm Number of attempts: 2 Airway Equipment and Method: Bougie stylet and Video-laryngoscopy Placement Confirmation: ETT inserted through vocal cords under direct vision,  positive ETCO2 and breath sounds checked- equal and bilateral Secured at: 22 cm Tube secured with: Tape Dental Injury: Teeth and Oropharynx as per pre-operative assessment  Difficulty Due To: Difficult Airway- due to large tongue and Difficult Airway- due to anterior larynx

## 2017-09-09 NOTE — Anesthesia Post-op Follow-up Note (Signed)
Anesthesia QCDR form completed.        

## 2017-09-09 NOTE — Procedures (Signed)
  [facility]            Bronchoscopy Note   FINDINGS/SUMMARY:   -Copious mucosal secretions throughout both airways which were suctioned. -EBUS guided needle sampling of right paratracheal lymphadenopathy. -Mild erythema throughout both airways, no endobronchial lesions were noted.  Random forceps biopsies taken of the right mainstem endobronchial mucosa. -Right middle lobe transbronchial brushings performed, followed by right middle lobe bronchoalveolar lavage.  Indication: Right paratracheal lymphadenopathy, suspected malignancy versus granulomatous disease. The patient (or their representative) was informed of the risks (including but not limited to bleeding, infection, respiratory failure, lung injury, tooth/oral injury) and benefits of the procedure and gave consent, see chart.   Pre-op diagnosis: Right paratracheal lymphadenopathy Post-op diagnosis: Same Estimated blood loss: 10 mL  Medications for procedure: Please see anesthesia note  Procedure description:  After obtaining informed consent, timeout was called to confirm the patient the procedure.  Patient was intubated and sedated by anesthesia, please see their notes for further details.  The EBUS bronchoscope was passed by the endotracheal tube to the carina, there was a moderate lymph node seen on the right paratracheal area.  Several passes were obtained with good returns. The EBUS scope was then removed, the regular white light bronchoscope was passed, an anatomical tumor was undertaken.  All airways were visualized, all segments were visualized, there was no obvious abnormalities other than moderate mucosal erythema, and copious mucosal secretions were suctioned. Random endobronchial forceps biopsies were taken x2 at the right mainstem proximally and distally.  Subsequently the bronchoscope was taken to the right middle lobe and wedged.  Transbronchial cytology brushings were taken from the right and left  subsegments of the right middle lobe.  Bronchoalveolar lavage was then performed in the right middle lobe.  The scope was then removed.   Condition post procedure: Stable   Complications: None evident  Deep Ashby Dawes, MD.  Board Certified in Internal Medicine, Pulmonary Medicine, [employer], and Sleep Medicine.  [facility] Office Number: [phone]  Patricia Pesa, M.D.  Cheral Marker, M.D  09/09/2017

## 2017-09-09 NOTE — Anesthesia Preprocedure Evaluation (Signed)
Anesthesia Evaluation  Patient identified by MRN, date of birth, ID band Patient awake    Reviewed: Allergy & Precautions, H&P , NPO status , Patient's Chart, lab work & pertinent test results  History of Anesthesia Complications Negative for: history of anesthetic complications  Airway Mallampati: III  TM Distance: >3 FB Neck ROM: full    Dental  (+) Chipped, Poor Dentition, Missing   Pulmonary neg shortness of breath, COPD, Current Smoker,           Cardiovascular Exercise Tolerance: Good (-) angina+ Peripheral Vascular Disease  (-) Past MI and (-) PND      Neuro/Psych negative neurological ROS  negative psych ROS   GI/Hepatic negative GI ROS, Neg liver ROS,   Endo/Other  negative endocrine ROS  Renal/GU      Musculoskeletal   Abdominal   Peds  Hematology negative hematology ROS (+)   Anesthesia Other Findings Past Medical History: No date: Brain aneurysm No date: Viral meningitis  Past Surgical History: No date: OOPHORECTOMY; Left 1969: surgical repair for brain tumor     Comment:  clips in head No date: WISDOM TOOTH EXTRACTION  BMI    Body Mass Index:  30.73 kg/m      Reproductive/Obstetrics negative OB ROS                             Anesthesia Physical Anesthesia Plan  ASA: III  Anesthesia Plan: General ETT   Post-op Pain Management:    Induction: Intravenous  PONV Risk Score and Plan: Ondansetron, Dexamethasone and Midazolam  Airway Management Planned: Oral ETT  Additional Equipment:   Intra-op Plan:   Post-operative Plan: Extubation in OR  Informed Consent: I have reviewed the patients History and Physical, chart, labs and discussed the procedure including the risks, benefits and alternatives for the proposed anesthesia with the patient or authorized representative who has indicated his/her understanding and acceptance.   Dental Advisory Given  Plan  Discussed with: Anesthesiologist, CRNA and Surgeon  Anesthesia Plan Comments: (Patient consented for risks of anesthesia including but not limited to:  - adverse reactions to medications - damage to teeth, lips or other oral mucosa - sore throat or hoarseness - Damage to heart, brain, lungs or loss of life  Patient voiced understanding.)        Anesthesia Quick Evaluation

## 2017-09-09 NOTE — Transfer of Care (Signed)
Immediate Anesthesia Transfer of Care Note  Patient: Connie Powers  Procedure(s) Performed: ENDOBRONCHIAL ULTRASOUND (N/A )  Patient Location: PACU  Anesthesia Type:General  Level of Consciousness: awake, alert  and oriented  Airway & Oxygen Therapy: Patient connected to face mask oxygen  Post-op Assessment: Post -op Vital signs reviewed and stable  Post vital signs: stable  Last Vitals:  Vitals Value Taken Time  BP 149/82 09/09/2017  2:48 PM  Temp    Pulse 95 09/09/2017  2:48 PM  Resp 24 09/09/2017  2:48 PM  SpO2 100 % 09/09/2017  2:48 PM  Vitals shown include unvalidated device data.  Last Pain:  Vitals:   09/09/17 1205  TempSrc: Temporal  PainSc: 0-No pain         Complications: No apparent anesthesia complications

## 2017-09-09 NOTE — Anesthesia Postprocedure Evaluation (Signed)
Anesthesia Post Note  Patient: Connie Powers  Procedure(s) Performed: ENDOBRONCHIAL ULTRASOUND (N/A )  Patient location during evaluation: PACU Anesthesia Type: General Level of consciousness: awake and alert Pain management: pain level controlled Vital Signs Assessment: post-procedure vital signs reviewed and stable Respiratory status: spontaneous breathing, nonlabored ventilation, respiratory function stable and patient connected to nasal cannula oxygen Cardiovascular status: blood pressure returned to baseline and stable Postop Assessment: no apparent nausea or vomiting Anesthetic complications: no     Last Vitals:  Vitals:   09/09/17 1547 09/09/17 1558  BP: (!) 176/73 (!) 171/77  Pulse: 78 82  Resp: 17 16  Temp:  36.7 C  SpO2: 92% 97%    Last Pain:  Vitals:   09/09/17 1558  TempSrc: Temporal  PainSc: 0-No pain                 Precious Haws Piscitello

## 2017-09-09 NOTE — Progress Notes (Signed)
Blood pressure 174/72 and oxygen sat on room air 93  Good with dr piscitello

## 2017-09-09 NOTE — Discharge Instructions (Signed)

## 2017-09-09 NOTE — Progress Notes (Signed)
Hydralazine 10mg  given for high blood pressure

## 2017-09-09 NOTE — Interval H&P Note (Signed)
History and Physical Interval Note:  09/09/2017 12:32 PM  Connie Powers  has presented today for surgery, with the diagnosis of MEDIASTINAL LYMPHADENOPATHY  The various methods of treatment have been discussed with the patient and family. After consideration of risks, benefits and other options for treatment, the patient has consented to  Procedure(s): ENDOBRONCHIAL ULTRASOUND (N/A) as a surgical intervention .  The patient's history has been reviewed, patient examined, no change in status, stable for surgery.  I have reviewed the patient's chart and labs.  Questions were answered to the patient's satisfaction.     Laverle Hobby

## 2017-09-10 ENCOUNTER — Encounter: Payer: Self-pay | Admitting: Internal Medicine

## 2017-09-10 LAB — ACID FAST SMEAR (AFB, MYCOBACTERIA)

## 2017-09-10 LAB — ACID FAST SMEAR (AFB): ACID FAST SMEAR - AFSCU2: NEGATIVE

## 2017-09-12 LAB — CULTURE, BAL-QUANTITATIVE: CULTURE: NORMAL — AB

## 2017-09-12 LAB — CULTURE, BAL-QUANTITATIVE W GRAM STAIN

## 2017-09-14 ENCOUNTER — Other Ambulatory Visit: Payer: Self-pay | Admitting: Pathology

## 2017-09-14 LAB — CYTOLOGY - NON PAP

## 2017-09-14 LAB — SURGICAL PATHOLOGY

## 2017-09-14 NOTE — Progress Notes (Unsigned)
Dt

## 2017-09-15 NOTE — Progress Notes (Addendum)
[facility] Consultation      Assessment and Plan:  Non-small cell lung cancer. -Status post EBUS bronchoscopy 09/09/17 with right paratracheal lymph node biopsy positive for lung cancer. -We will refer to oncology for further workup and treatment.  Nicotine abuse. - Discussed importance of smoke cessation, spent greater than 3 minutes in discussion.  COPD. -Patient will continue to follow-up with Dr. Raul Del.  ADDENDUM 09/16/17. Pt discussed at lung cancer conference, metastatic spread of primary lung cancer to right paratracheal node, primary site in the lung unknown. Plan for chemo/rads.    Date: 09/15/2017  MRN# [id number] Connie Powers December 26, 1950    Connie Powers is a 67 y.o. old female seen in consultation for chief complaint of:    Chief Complaint  Patient presents with  . Follow-up    pt here 1 week f/u bronchoscopy    HPI:   The patient is a 67 year old female she follows with Dr. Raul Del she has a history of smoking with moderate COPD.  She was noted to an isolated enlarged right paratracheal node which was positive on PET scan (initially found on CT lung screening at [facility])  She underwent EBUS bronchoscopy with needle sampling of the right paratracheal lymph node which was positive for METASTATIC NON-SMALL CELL CARCINOMA, FAVOR ADENOCARCINOMA.   Since her bronchoscopy she has felt well, she coughed for 1 day, had some mild chest pain, both resolved in about 24 hours. She did not have any fevers. She now feels that she recovered and is back to normal.   Imaging personally reviewed CT/PET scan 08/09/17; there is a area of right paratracheal lymphadenopathy elevated SUV of 7.1 lungs show mildly reduced volume, mild bibasilar atelectasis.  Outside CT chest report (images not available) [facility] cancer screening 05/27/17 Impression:   1. A few bilateral pulmonary nodules measuring up to 5 mm are indeterminate. Considering the  suspicious 2.0 cm mediastinal adenopathy, follow up with PET/CT is recommended.  2. Mild bronchial wall thickening could represent airway disease including bronchitis.      Social Hx:   Social History   Tobacco Use  . Smoking status: Current Every Day Smoker    Packs/day: 0.25  . Smokeless tobacco: Never Used  . Tobacco comment: currently at 4 cigarettes a day  Substance Use Topics  . Alcohol use: Yes    Comment: social  . Drug use: Yes    Types: Cocaine   Medication:    Current Outpatient Medications:  .  aspirin EC 81 MG tablet, Take 81 mg by mouth daily., Disp: , Rfl:  .  umeclidinium-vilanterol (ANORO ELLIPTA) 62.5-25 MCG/INH AEPB, Inhale 1 puff into the lungs daily as needed (shortness of breath). , Disp: , Rfl:    Allergies:  Patient has no known allergies.  Review of Systems: Gen:  Denies  fever, sweats, chills HEENT: Denies blurred vision, double vision. bleeds, sore throat Cvc:  No dizziness, chest pain. Resp:   Denies cough or sputum production, shortness of breath Gi: Denies swallowing difficulty, stomach pain. Gu:  Denies bladder incontinence, burning urine Ext:   No Joint pain, stiffness. Skin: No skin rash,  hives  Endoc:  No polyuria, polydipsia. Psych: No depression, insomnia. Other:  All other systems were reviewed with the patient and were negative other that what is mentioned in the HPI.   Physical Examination:   VS: BP (!) 172/76 (BP Location: Left Arm, Cuff Size: Normal)   Pulse 82   Resp 16   Ht  5\' 4"  (1.626 m)   Wt 175 lb (79.4 kg)   SpO2 97%   BMI 30.04 kg/m   General Appearance: No distress  Neuro:without focal findings,  speech normal,  HEENT: PERRLA, EOM intact.   Pulmonary: normal breath sounds, No wheezing.  CardiovascularNormal S1,S2.  No m/r/g.   Abdomen: Benign, Soft, non-tender. Renal:  No costovertebral tenderness  GU:  No performed at this time. Endoc: No evident thyromegaly, no signs of acromegaly. Skin:   warm, no  rashes, no ecchymosis  Extremities: normal, no cyanosis, clubbing.  Other findings:    LABORATORY PANEL:   CBC No results for input(s): WBC, HGB, HCT, PLT in the last 168 hours. ------------------------------------------------------------------------------------------------------------------  Chemistries  No results for input(s): NA, K, CL, CO2, GLUCOSE, BUN, CREATININE, CALCIUM, MG, AST, ALT, ALKPHOS, BILITOT in the last 168 hours.  Invalid input(s): GFRCGP ------------------------------------------------------------------------------------------------------------------  Cardiac Enzymes No results for input(s): TROPONINI in the last 168 hours. ------------------------------------------------------------  RADIOLOGY:  No results found.     Thank  you for the consultation and for allowing [facility] to assist in the care of your patient. Our recommendations are noted above.  Please contact us if we can be of further service.   Marda Stalker, MD.  Board Certified in Internal Medicine, Pulmonary Medicine, [place], and Sleep Medicine.  [facility] Office Number: [phone]  Patricia Pesa, M.D.  Merton Border, M.D  09/15/2017

## 2017-09-16 ENCOUNTER — Encounter: Payer: Self-pay | Admitting: Internal Medicine

## 2017-09-16 ENCOUNTER — Encounter: Payer: Self-pay | Admitting: *Deleted

## 2017-09-16 ENCOUNTER — Ambulatory Visit (INDEPENDENT_AMBULATORY_CARE_PROVIDER_SITE_OTHER): Payer: Medicare HMO | Admitting: Internal Medicine

## 2017-09-16 VITALS — BP 172/76 | HR 82 | Resp 16 | Ht 64.0 in | Wt 175.0 lb

## 2017-09-16 DIAGNOSIS — F17210 Nicotine dependence, cigarettes, uncomplicated: Secondary | ICD-10-CM

## 2017-09-16 DIAGNOSIS — R590 Localized enlarged lymph nodes: Secondary | ICD-10-CM

## 2017-09-16 NOTE — Progress Notes (Signed)
  Oncology Nurse Navigator Documentation  Navigator Location: [facility] (09/16/17 1500) Referral date to RadOnc/MedOnc: 09/16/17 (09/16/17 1500) )Navigator Encounter Type: Introductory phone call;Telephone (09/16/17 1500) Telephone: Lahoma Crocker Call;Appt Confirmation/Clarification (09/16/17 1500) Abnormal Finding Date: 05/27/17 (09/16/17 1500) Confirmed Diagnosis Date: 09/14/17 (09/16/17 1500)                   Barriers/Navigation Needs: Coordination of Care (09/16/17 1500)   Interventions: Coordination of Care (09/16/17 1500)   Coordination of Care: Appts (09/16/17 1500)        Acuity: Level 1 (09/16/17 1500) Acuity Level 1: Initial guidance, education and coordination as needed (09/16/17 1500)  phone call made to patient to introduce to navigator services and to give new appt information. Pt given appt for rad-onc consultation with Dr. Baruch Gouty on 4/24 at 10am followed by med-onc consultation with Dr. Tasia Catchings on same day at 11:30am. Contact info given to patient and instructed to call with any further questions or needs. Pt verbalized understanding. Nothing further needed at this time.     Time Spent with Patient: 30 (09/16/17 1500)

## 2017-09-16 NOTE — Patient Instructions (Addendum)
Will refer you to an oncologist (cancer doctor) to determine further treatment.  Follow up with Dr. Raul Del.

## 2017-09-16 NOTE — Addendum Note (Signed)
Addended by: Stephanie Coup on: 09/16/2017 03:17 PM   Modules accepted: Orders

## 2017-09-21 ENCOUNTER — Ambulatory Visit
Admission: RE | Admit: 2017-09-21 | Discharge: 2017-09-21 | Disposition: A | Discharge status: Home / Self Care | Payer: Self-pay | Source: Ambulatory Visit | Attending: Internal Medicine | Admitting: Internal Medicine

## 2017-09-21 ENCOUNTER — Other Ambulatory Visit: Payer: Self-pay | Admitting: Internal Medicine

## 2017-09-21 DIAGNOSIS — R918 Other nonspecific abnormal finding of lung field: Secondary | ICD-10-CM

## 2017-09-22 ENCOUNTER — Encounter: Payer: Self-pay | Admitting: *Deleted

## 2017-09-22 ENCOUNTER — Ambulatory Visit
Admission: RE | Admit: 2017-09-22 | Discharge: 2017-09-22 | Disposition: A | Discharge status: Home / Self Care | Payer: Medicare HMO | Source: Ambulatory Visit | Attending: Radiation Oncology | Admitting: Radiation Oncology

## 2017-09-22 ENCOUNTER — Other Ambulatory Visit: Payer: Self-pay

## 2017-09-22 ENCOUNTER — Encounter: Payer: Self-pay | Admitting: Radiation Oncology

## 2017-09-22 ENCOUNTER — Inpatient Hospital Stay: Payer: Medicare HMO | Attending: Oncology | Admitting: Oncology

## 2017-09-22 ENCOUNTER — Telehealth (INDEPENDENT_AMBULATORY_CARE_PROVIDER_SITE_OTHER): Payer: Self-pay

## 2017-09-22 ENCOUNTER — Encounter: Payer: Self-pay | Admitting: Oncology

## 2017-09-22 VITALS — BP 176/92 | HR 81 | Temp 97.0°F | Resp 20 | Wt 181.1 lb

## 2017-09-22 VITALS — BP 162/81 | HR 81 | Temp 98.6°F | Resp 16 | Ht 62.0 in | Wt 182.0 lb

## 2017-09-22 DIAGNOSIS — Z720 Tobacco use: Secondary | ICD-10-CM | POA: Diagnosis not present

## 2017-09-22 DIAGNOSIS — C3491 Malignant neoplasm of unspecified part of right bronchus or lung: Secondary | ICD-10-CM | POA: Diagnosis present

## 2017-09-22 DIAGNOSIS — D7589 Other specified diseases of blood and blood-forming organs: Secondary | ICD-10-CM | POA: Insufficient documentation

## 2017-09-22 DIAGNOSIS — Z8679 Personal history of other diseases of the circulatory system: Secondary | ICD-10-CM

## 2017-09-22 DIAGNOSIS — Z8661 Personal history of infections of the central nervous system: Secondary | ICD-10-CM | POA: Insufficient documentation

## 2017-09-22 DIAGNOSIS — R05 Cough: Secondary | ICD-10-CM | POA: Insufficient documentation

## 2017-09-22 DIAGNOSIS — C771 Secondary and unspecified malignant neoplasm of intrathoracic lymph nodes: Secondary | ICD-10-CM | POA: Diagnosis not present

## 2017-09-22 DIAGNOSIS — F17210 Nicotine dependence, cigarettes, uncomplicated: Secondary | ICD-10-CM | POA: Diagnosis not present

## 2017-09-22 DIAGNOSIS — Z7982 Long term (current) use of aspirin: Secondary | ICD-10-CM | POA: Diagnosis not present

## 2017-09-22 DIAGNOSIS — Z79899 Other long term (current) drug therapy: Secondary | ICD-10-CM | POA: Insufficient documentation

## 2017-09-22 DIAGNOSIS — C3490 Malignant neoplasm of unspecified part of unspecified bronchus or lung: Secondary | ICD-10-CM | POA: Insufficient documentation

## 2017-09-22 HISTORY — DX: Malignant neoplasm of unspecified part of right bronchus or lung: C34.91

## 2017-09-22 NOTE — Telephone Encounter (Signed)
Attempted to contact the patient to schedule a port placement and had to leave a message for a return call.

## 2017-09-22 NOTE — Progress Notes (Addendum)
Hematology/Oncology Consult note [facility] [phone] [phone]   Patient Care Team: Sharyne Peach, MD as PCP - General (Family Medicine) Telford Nab, RN as Registered Nurse  REFERRING PROVIDER: Ileene Musa. CHIEF COMPLAINTS/PURPOSE OF CONSULTATION:  Evaluation of lung cancer  HISTORY OF PRESENTING ILLNESS:  Connie Powers is a  67 y.o.  female with PMH listed below who was referred to me for evaluation of newly diagnosed lung cancer.  CT lung cancer screen 05/27/2017   1. A few bilateral pulmonary nodules measuring up to 5 mm are indeterminate. Considering the suspicious 2.0 cm mediastinal adenopathy, follow up with PET/CT is recommended. 2. Mild bronchial wall thickening could represent airway disease including bronchitis.ACR Lung-RADS Category and Recommendation*: ACR Lung-RADS Category 2S (S modifier for mediastinal adenopathy)  PET scan 3/112019 1. No hypermetabolic pulmonary nodules.2. Hypermetabolic enlarged right paratracheal lymph node, of uncertain etiology in isolation. Lymphoproliferative disorder cannot be excluded. 3.  Aortic atherosclerosis (ICD10-170.0).  EBUS biopsy showed non small cell lung cancer favoring adenocarcinoma. Case was discussed on tumor board on 09/16/2017 and consensus recommendation is to treat as Stage IIIA disease given the invasion of mediastinum.   Patient reports non productive cough, otherwise feels normal. Denies weight loss, hemoptysis, SOB, chest pain.  She has been working on smoke cessation, only smoke 3-4 cigarettes a day.    Review of Systems  Constitutional: Negative for chills, fever, malaise/fatigue and weight loss.  HENT: Negative for hearing loss and nosebleeds.   Eyes: Negative for double vision and photophobia.  Respiratory: Negative for cough, hemoptysis, sputum production and shortness of breath.   Cardiovascular: Negative for chest pain, palpitations, claudication  and leg swelling.  Gastrointestinal: Negative for constipation, diarrhea, heartburn, nausea and vomiting.  Genitourinary: Negative for dysuria.  Musculoskeletal: Negative for myalgias and neck pain.  Skin: Negative for rash.  Neurological: Negative for dizziness and sensory change.  Endo/Heme/Allergies: Does not bruise/bleed easily.  Psychiatric/Behavioral: Negative for depression, hallucinations and substance abuse.    MEDICAL HISTORY:  Past Medical History:  Diagnosis Date  . Brain aneurysm   . Difficult intubation   . Non-small cell cancer of right lung ([place]) 09/22/2017  . Viral meningitis     SURGICAL HISTORY: Past Surgical History:  Procedure Laterality Date  . ENDOBRONCHIAL ULTRASOUND N/A 09/09/2017   Procedure: ENDOBRONCHIAL ULTRASOUND;  Surgeon: Laverle Hobby, MD;  Location: [facility] ORS;  Service: Pulmonary;  Laterality: N/A;  . OOPHORECTOMY Left   . surgical repair for brain tumor  1969   clips in head  . WISDOM TOOTH EXTRACTION      SOCIAL HISTORY: Social History   Socioeconomic History  . Marital status: Married    Spouse name: Not on file  . Number of children: Not on file  . Years of education: Not on file  . Highest education level: Not on file  Occupational History  . Not on file  Social Needs  . Financial resource strain: Not on file  . Food insecurity:    Worry: Not on file    Inability: Not on file  . Transportation needs:    Medical: Not on file    Non-medical: Not on file  Tobacco Use  . Smoking status: Current Every Day Smoker    Packs/day: 0.25  . Smokeless tobacco: Never Used  . Tobacco comment: currently at 4 cigarettes a day  Substance and Sexual Activity  . Alcohol use: Yes    Comment: social  . Drug use: Yes    Types: Cocaine  .  Sexual activity: Not on file  Lifestyle  . Physical activity:    Days per week: Not on file    Minutes per session: Not on file  . Stress: Not on file  Relationships  . Social connections:     Talks on phone: Not on file    Gets together: Not on file    Attends religious service: Not on file    Active member of club or organization: Not on file    Attends meetings of clubs or organizations: Not on file    Relationship status: Not on file  . Intimate partner violence:    Fear of current or ex partner: Not on file    Emotionally abused: Not on file    Physically abused: Not on file    Forced sexual activity: Not on file  Other Topics Concern  . Not on file  Social History Narrative  . Not on file    FAMILY HISTORY: Family History  Problem Relation Age of Onset  . Diabetes Mother   . Hypertension Mother   . Hyperlipidemia Mother   . Arthritis Mother   . Hypertension Father   . Hyperlipidemia Father   . Heart attack Father        x2  . Breast cancer Neg Hx     ALLERGIES:  has No Known Allergies.  MEDICATIONS:  Current Outpatient Medications  Medication Sig Dispense Refill  . aspirin EC 81 MG tablet Take 81 mg by mouth daily.    Marland Kitchen umeclidinium-vilanterol (ANORO ELLIPTA) 62.5-25 MCG/INH AEPB Inhale 1 puff into the lungs daily as needed (shortness of breath).      No current facility-administered medications for this visit.      PHYSICAL EXAMINATION: ECOG PERFORMANCE STATUS: 0 - Asymptomatic Vitals:   09/22/17 1130 09/22/17 1132  BP:  (!) 162/81  Pulse:  81  Resp: 16   Temp:  98.6 F (37 C)   Filed Weights   09/22/17 1130  Weight: 182 lb (82.6 kg)    Physical Exam  Constitutional: She is oriented to person, place, and time. She appears well-developed and well-nourished. No distress.  HENT:  Head: Atraumatic.  Eyes: Pupils are equal, round, and reactive to light. EOM are normal. Left eye exhibits no discharge.  Neck: Normal range of motion. Neck supple.  Cardiovascular: Normal rate, regular rhythm and normal heart sounds.  No murmur heard. Pulmonary/Chest: Effort normal and breath sounds normal.  Abdominal: Soft. Bowel sounds are normal. There is  no tenderness. There is no guarding.  Musculoskeletal: Normal range of motion. She exhibits no edema.  Lymphadenopathy:    She has no cervical adenopathy.  Neurological: She is alert and oriented to person, place, and time. No cranial nerve deficit. Coordination normal.  Skin: Skin is warm and dry.  Psychiatric: She has a normal mood and affect. Her behavior is normal.     LABORATORY DATA:  I have reviewed the data as listed Lab Results  Component Value Date   WBC 4.6 09/07/2017   HGB 13.0 09/07/2017   HCT 38.4 09/07/2017   MCV 105.1 (H) 09/07/2017   PLT 247 09/07/2017   Recent Labs    09/07/17 1334  NA 140  K 3.5  CL 107  CO2 26  GLUCOSE 123*  BUN 6  CREATININE 0.49  CALCIUM 8.8*  GFRNONAA >60  GFRAA >60       ASSESSMENT & PLAN:  Cancer Staging Non-small cell cancer of right lung (HCC) Staging form: Lung, AJCC  8th Edition - Clinical stage from 09/22/2017: Stage Unknown (cTX, cN1, cM0) - Signed by Earlie Server, MD on 09/22/2017 - Pathologic: No stage assigned - Unsigned  1. Non-small cell cancer of right lung ([place])   2. Tobacco abuse   3. Macrocytosis without anemia    # I had a lengthy discussion with patient about her diagnosis, disease extent, prognosis. Tumor board recommendation was discussed with her. Recommend concurrent chemotherapy with weekly Carboplatin and Taxol with Radiation, if stable disease,  followed by maintenance immunotherapy  I will also obtain CT brain to rule out CNS involvement.  She has had brain aneurysm and has a clip.   #  I explained to the patient the risks and benefits of chemotherapy including all but not limited to infusion reaction,  hair loss, mouth sore, nausea, vomiting, low blood counts, bleeding, and risk of life threatening infection and even death, secondary malignancy etc.  Risk of neuropathy is associated with taxol.  . # Chemotherapy education; port placement.  Antiemetics-Zofran and Compazine; EMLA cream sent to  pharmacy  # Smoke cessation discussed.  # Macrocytosis, will check B12, and Folate.   All questions were answered. The patient knows to call the clinic with any problems questions or concerns.  Return of visit: tp be determined. Day 1 of chemotherapy.  Thank you for this kind referral and the opportunity to participate in the care of this patient. A copy of today's note is routed to referring provider   Total face to face encounter time for this patient visit was 60 min. >50% of the time was  spent in counseling and coordination of care.   Earlie Server, MD, PhD Hematology Oncology [facility] at [facility] Pager- [phone] 09/22/2017

## 2017-09-22 NOTE — Consult Note (Signed)
NEW PATIENT EVALUATION  Name: Connie Powers  MRN: [id number]  Date:   09/22/2017     DOB: Jul 14, 1950   This 67 y.o. 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 Lung Cancer    Initial Evaluation    DIAGNOSIS: There were no encounter diagnoses.   PREVIOUS INVESTIGATIONS:  PET CT and CT scans reviewed Pathology report reviewed Clinical notes reviewed  HPI: patient is a 67 year old female who presented with new onset of wheezing to pulmonology. CT scan showed a right paratracheal lymph node with consideration for granulomatous disease versus malignancy.PET CT scan demonstrated hypermetabolic enlarged right paratracheal lymph node. There were no hypermetabolic pulmonary nodules noted. Patient underwent EBUS with cytology positive for non-small cell lung cancer favoring adenocarcinoma. Her case was presented at tumor conference and recommendation based on the invasion of the mediastinum to treat this as IIIa disease with concurrent chemoradiation. She is seen today for radiation oncology opinion she is doing well she specifically denies cough hemoptysis or chest tightness.  PLANNED TREATMENT REGIMEN: concurrent chemoradiation  PAST MEDICAL HISTORY:  has a past medical history of Brain aneurysm, Difficult intubation, Non-small cell cancer of right lung ([place]) (09/22/2017), and Viral meningitis.    PAST SURGICAL HISTORY:  Past Surgical History:  Procedure Laterality Date  . ENDOBRONCHIAL ULTRASOUND N/A 09/09/2017   Procedure: ENDOBRONCHIAL ULTRASOUND;  Surgeon: Laverle Hobby, MD;  Location: [facility] ORS;  Service: Pulmonary;  Laterality: N/A;  . OOPHORECTOMY Left   . surgical repair for brain tumor  1969   clips in head  . WISDOM TOOTH EXTRACTION      FAMILY HISTORY: family history includes Arthritis  in her mother; Diabetes in her mother; Heart attack in her father; Hyperlipidemia in her father and mother; Hypertension in her father and mother.  SOCIAL HISTORY:  reports that she has been smoking.  She has been smoking about 0.25 packs per day. She has never used smokeless tobacco. She reports that she drinks alcohol. She reports that she has current or past drug history. Drug: Cocaine.  ALLERGIES: Patient has no known allergies.  MEDICATIONS:  Current Outpatient Medications  Medication Sig Dispense Refill  . aspirin EC 81 MG tablet Take 81 mg by mouth daily.    Marland Kitchen umeclidinium-vilanterol (ANORO ELLIPTA) 62.5-25 MCG/INH AEPB Inhale 1 puff into the lungs daily as needed (shortness of breath).      No current facility-administered medications for this encounter.     ECOG PERFORMANCE STATUS:  0 - Asymptomatic  REVIEW OF SYSTEMS:  Patient denies any weight loss, fatigue, weakness, fever, chills or night sweats. Patient denies any loss of vision, blurred vision. Patient denies any ringing  of the ears or hearing loss. No irregular heartbeat. Patient denies heart murmur or history of fainting. Patient denies any chest pain or pain radiating to her upper extremities. Patient denies any shortness of breath, difficulty breathing at night, cough or hemoptysis. Patient denies any swelling in the lower legs. Patient denies any nausea vomiting, vomiting of blood, or coffee ground material in the vomitus. Patient denies any stomach pain. Patient states has had normal bowel movements no significant constipation or diarrhea. Patient denies any dysuria, hematuria or significant nocturia. Patient denies any problems walking, swelling in the joints or loss of balance. Patient denies any skin changes, loss of hair or loss of weight. Patient  denies any excessive worrying or anxiety or significant depression. Patient denies any problems with insomnia. Patient denies excessive thirst, polyuria, polydipsia. Patient  denies any swollen glands, patient denies easy bruising or easy bleeding. Patient denies any recent infections, allergies or URI. Patient "s visual fields have not changed significantly in recent time.    PHYSICAL EXAM: BP (!) 176/92   Pulse 81   Temp (!) 97 F (36.1 C)   Resp 20   Wt 181 lb 1.7 oz (82.2 kg)   BMI 31.09 kg/m  Well-developed well-nourished patient in NAD. HEENT reveals PERLA, EOMI, discs not visualized.  Oral cavity is clear. No oral mucosal lesions are identified. Neck is clear without evidence of cervical or supraclavicular adenopathy. Lungs are clear to A&P. Cardiac examination is essentially unremarkable with regular rate and rhythm without murmur rub or thrill. Abdomen is benign with no organomegaly or masses noted. Motor sensory and DTR levels are equal and symmetric in the upper and lower extremities. Cranial nerves II through XII are grossly intact. Proprioception is intact. No peripheral adenopathy or edema is identified. No motor or sensory levels are noted. Crude visual fields are within normal range.  LABORATORY DATA: cytology reports reviewed    RADIOLOGY RESULTS:CT scan PET CT scans reviewed and compatible above-stated findings   IMPRESSION: stage IIIa non-small cell lung cancer of the right lung in 67 year old female  PLAN: at this time I to go ahead with radiation therapy with concurrent chemotherapy for her stage IIIa disease. Would plan on delivering up to 7000 cGy to the right paratracheal lymph node area. I believe I MRT would be best suited to avoid critical structures such as her esophagus heart spinal cord and normal lung volume. I have personally set up and scheduled CT simulation for early next week. Will use for D study and motion restriction protocol for her treatment planning. I will see use a PET CT fusion study. Risks and benefits of treatment including possible development of cough fatigue radiation esophagitis alteration of blood counts skin  reaction all were discussed in detail with the patient.There will be extra effort by both professional staff as well as technical staff to coordinate and manage concurrent chemoradiation and ensuing side effects during her treatments.patient seems to comprehend my treatment plan well. Patient will be seeing medical oncology after my appointment today and we will coordinate her care.  I would like to take this opportunity to thank you for allowing me to participate in the care of your patient.Noreene Filbert, MD

## 2017-09-22 NOTE — Progress Notes (Signed)
  Oncology Nurse Navigator Documentation  Navigator Location: [facility] (09/22/17 1300)   )Navigator Encounter Type: Initial MedOnc;Initial RadOnc (09/22/17 1300)                       Treatment Phase: Pre-Tx/Tx Discussion (09/22/17 1300) Barriers/Navigation Needs: Coordination of Care;Education (09/22/17 1300) Education: Understanding Cancer/ Treatment Options;Newly Diagnosed Cancer Education (09/22/17 1300) Interventions: Coordination of Care (09/22/17 1300)   Coordination of Care: Appts;Radiology (09/22/17 1300)       met with patient during initial consult today with Dr. Baruch Gouty and Dr. Tasia Catchings to discuss treatment options. All questions answered at the time of visit. Pt given resources regarding diagnosis and supportive services. Reviewed upcoming appts with patient. Informed that vascular surgeon's office will contact her with her port placement appt. Contact info given and instructed to call with any further questions or needs. Pt verbalized understanding.            Time Spent with Patient: 90 (09/22/17 1300)

## 2017-09-22 NOTE — Progress Notes (Signed)
Patient here for initial visit. °

## 2017-09-22 NOTE — Progress Notes (Signed)
START ON PATHWAY REGIMEN - Non-Small Cell Lung     Administer weekly:     Paclitaxel      Carboplatin   **Always confirm dose/schedule in your pharmacy ordering system**    Patient Characteristics: Stage III - Unresectable, PS = 0, 1 AJCC T Category: TX Current Disease Status: No Distant Mets or Local Recurrence AJCC N Category: N1 AJCC M Category: M0 AJCC 8 Stage Grouping: Unknown Performance Status: PS = 0, 1 Intent of Therapy: Curative Intent, Discussed with Patient

## 2017-09-24 ENCOUNTER — Encounter (INDEPENDENT_AMBULATORY_CARE_PROVIDER_SITE_OTHER): Payer: Self-pay

## 2017-09-27 ENCOUNTER — Other Ambulatory Visit (INDEPENDENT_AMBULATORY_CARE_PROVIDER_SITE_OTHER): Payer: Self-pay | Admitting: Vascular Surgery

## 2017-09-28 ENCOUNTER — Ambulatory Visit
Admission: RE | Admit: 2017-09-28 | Discharge: 2017-09-28 | Disposition: A | Discharge status: Home / Self Care | Payer: Medicare HMO | Source: Ambulatory Visit | Attending: Radiation Oncology | Admitting: Radiation Oncology

## 2017-09-28 DIAGNOSIS — F17210 Nicotine dependence, cigarettes, uncomplicated: Secondary | ICD-10-CM | POA: Diagnosis not present

## 2017-09-28 DIAGNOSIS — C3491 Malignant neoplasm of unspecified part of right bronchus or lung: Secondary | ICD-10-CM | POA: Insufficient documentation

## 2017-09-28 DIAGNOSIS — Z510 Encounter for antineoplastic radiation therapy: Secondary | ICD-10-CM | POA: Insufficient documentation

## 2017-09-28 MED ORDER — CEFAZOLIN SODIUM-DEXTROSE 2-4 GM/100ML-% IV SOLN
2.0000 g | Freq: Once | INTRAVENOUS | Status: AC
  Administered 2017-09-29: 2 g via INTRAVENOUS

## 2017-09-29 ENCOUNTER — Ambulatory Visit
Admission: RE | Admit: 2017-09-29 | Discharge: 2017-09-29 | Disposition: A | Discharge status: Home / Self Care | Payer: Medicare HMO | Source: Ambulatory Visit | Attending: Vascular Surgery | Admitting: Vascular Surgery

## 2017-09-29 ENCOUNTER — Encounter
Admission: RE | Disposition: A | Discharge status: Home / Self Care | Payer: Self-pay | Source: Ambulatory Visit | Attending: Vascular Surgery

## 2017-09-29 DIAGNOSIS — D7589 Other specified diseases of blood and blood-forming organs: Secondary | ICD-10-CM | POA: Insufficient documentation

## 2017-09-29 DIAGNOSIS — C3490 Malignant neoplasm of unspecified part of unspecified bronchus or lung: Secondary | ICD-10-CM

## 2017-09-29 DIAGNOSIS — Z7982 Long term (current) use of aspirin: Secondary | ICD-10-CM | POA: Diagnosis not present

## 2017-09-29 DIAGNOSIS — Z90721 Acquired absence of ovaries, unilateral: Secondary | ICD-10-CM | POA: Insufficient documentation

## 2017-09-29 DIAGNOSIS — Z8249 Family history of ischemic heart disease and other diseases of the circulatory system: Secondary | ICD-10-CM | POA: Diagnosis not present

## 2017-09-29 DIAGNOSIS — Z9889 Other specified postprocedural states: Secondary | ICD-10-CM | POA: Diagnosis not present

## 2017-09-29 DIAGNOSIS — Z833 Family history of diabetes mellitus: Secondary | ICD-10-CM | POA: Insufficient documentation

## 2017-09-29 DIAGNOSIS — F17210 Nicotine dependence, cigarettes, uncomplicated: Secondary | ICD-10-CM | POA: Insufficient documentation

## 2017-09-29 HISTORY — PX: PORTA CATH INSERTION: CATH118285

## 2017-09-29 SURGERY — PORTA CATH INSERTION
Anesthesia: Moderate Sedation

## 2017-09-29 MED ORDER — HYDROMORPHONE HCL 1 MG/ML IJ SOLN: 1.0000 mg | Freq: Once | INTRAMUSCULAR | Status: DC | PRN

## 2017-09-29 MED ORDER — SODIUM CHLORIDE 0.9 % IV SOLN
Freq: Once | INTRAVENOUS | Status: AC
  Administered 2017-09-29: 08:00:00
  Filled 2017-09-29: qty 80

## 2017-09-29 MED ORDER — ONDANSETRON HCL 4 MG/2ML IJ SOLN: 4.0000 mg | Freq: Four times a day (QID) | INTRAMUSCULAR | Status: DC | PRN

## 2017-09-29 MED ORDER — MIDAZOLAM HCL 2 MG/2ML IJ SOLN
INTRAMUSCULAR | Status: DC | PRN
  Administered 2017-09-29: 2 mg via INTRAVENOUS

## 2017-09-29 MED ORDER — HEPARIN (PORCINE) IN NACL 1000-0.9 UT/500ML-% IV SOLN
INTRAVENOUS | Status: AC
  Filled 2017-09-29: qty 500

## 2017-09-29 MED ORDER — SODIUM CHLORIDE 0.9 % IV SOLN
INTRAVENOUS | Status: DC
  Administered 2017-09-29: 08:00:00 via INTRAVENOUS

## 2017-09-29 MED ORDER — LABETALOL HCL 5 MG/ML IV SOLN: 10.0000 mg | Freq: Once | INTRAVENOUS | Status: DC

## 2017-09-29 MED ORDER — CEFAZOLIN SODIUM-DEXTROSE 2-4 GM/100ML-% IV SOLN
INTRAVENOUS | Status: AC
  Filled 2017-09-29: qty 100

## 2017-09-29 MED ORDER — LIDOCAINE-EPINEPHRINE (PF) 1 %-1:200000 IJ SOLN
INTRAMUSCULAR | Status: AC
  Filled 2017-09-29: qty 30

## 2017-09-29 MED ORDER — FENTANYL CITRATE (PF) 100 MCG/2ML IJ SOLN
INTRAMUSCULAR | Status: AC
  Filled 2017-09-29: qty 2

## 2017-09-29 MED ORDER — LABETALOL HCL 5 MG/ML IV SOLN
INTRAVENOUS | Status: AC
  Filled 2017-09-29: qty 4

## 2017-09-29 MED ORDER — FENTANYL CITRATE (PF) 100 MCG/2ML IJ SOLN
INTRAMUSCULAR | Status: DC | PRN
  Administered 2017-09-29: 50 ug via INTRAVENOUS

## 2017-09-29 MED ORDER — MIDAZOLAM HCL 5 MG/5ML IJ SOLN
INTRAMUSCULAR | Status: AC
  Filled 2017-09-29: qty 5

## 2017-09-29 SURGICAL SUPPLY — 7 items
KIT PORT POWER 8FR ISP CVUE (Port) ×3 IMPLANT
PACK ANGIOGRAPHY (CUSTOM PROCEDURE TRAY) ×3 IMPLANT
PAD GROUND ADULT SPLIT (MISCELLANEOUS) ×3 IMPLANT
PENCIL ELECTRO HAND CTR (MISCELLANEOUS) ×3 IMPLANT
SUT MNCRL AB 4-0 PS2 18 (SUTURE) ×3 IMPLANT
SUT PROLENE 0 CT 1 30 (SUTURE) ×3 IMPLANT
SUTURE VIC 3-0 (SUTURE) ×3 IMPLANT

## 2017-09-29 NOTE — Patient Instructions (Signed)

## 2017-09-29 NOTE — Op Note (Signed)
      [facility]       Operative Note  Date: 09/29/2017  Preoperative diagnosis:  1. Lung cancer  Postoperative diagnosis:  Same as above  Procedures: #1. Ultrasound guidance for vascular access to the right internal jugular vein. #2. Fluoroscopic guidance for placement of catheter. #3. Placement of CT compatible Port-A-Cath, right internal jugular vein.  Surgeon: Leotis Pain, MD.   Anesthesia: Local with moderate conscious sedation for approximately 15  minutes using 2 mg of Versed and 50 mcg of Fentanyl  Fluoroscopy time: less than 1 minute  Contrast used: 0  Estimated blood loss: 5 cc  Indication for the procedure:  The patient is a 67 y.o.female with lung cancer.  The patient needs a Port-A-Cath for durable venous access, chemotherapy, lab draws, and CT scans. We are asked to place this. Risks and benefits were discussed and informed consent was obtained.  Description of procedure: The patient was brought to the vascular and interventional radiology suite.  Moderate conscious sedation was administered throughout the procedure during a face to face encounter with the patient with my supervision of the RN administering medicines and monitoring the patient's vital signs, pulse oximetry, telemetry and mental status throughout from the start of the procedure until the patient was taken to the recovery room. The right neck chest and shoulder were sterilely prepped and draped, and a sterile surgical field was created. Ultrasound was used to help visualize a patent right internal jugular vein. This was then accessed under direct ultrasound guidance without difficulty with the Seldinger needle and a permanent image was recorded. A J-wire was placed. After skin nick and dilatation, the peel-away sheath was then placed over the wire. I then anesthetized an area under the clavicle approximately 1-2 fingerbreadths. A transverse incision was created and an inferior pocket was  created with electrocautery and blunt dissection. The port was then brought onto the field, placed into the pocket and secured to the chest wall with 2 Prolene sutures. The catheter was connected to the port and tunneled from the subclavicular incision to the access site. Fluoroscopic guidance was then used to cut the catheter to an appropriate length. The catheter was then placed through the peel-away sheath and the peel-away sheath was removed. The catheter tip was parked in excellent location under fluorocoscopic guidance in the cavoatrial junction. The pocket was then irrigated with antibiotic impregnated saline and the wound was closed with a running 3-0 Vicryl and a 4-0 Monocryl. The access incision was closed with a single 4-0 Monocryl. The Huber needle was used to withdraw blood and flush the port with heparinized saline. Dermabond was then placed as a dressing. The patient tolerated the procedure well and was taken to the recovery room in stable condition.   Leotis Pain 09/29/2017 9:05 AM   This note was created with Dragon Medical transcription system. Any errors in dictation are purely unintentional.

## 2017-09-29 NOTE — H&P (Signed)
[facility] History & Physical Update  The patient was interviewed and re-examined.  The patient's previous History and Physical has been reviewed and is unchanged.  There is no change in the plan of care. We plan to proceed with the scheduled procedure.  Leotis Pain, MD  09/29/2017, 7:44 AM

## 2017-09-29 NOTE — Discharge Instructions (Signed)
Implanted Port Insertion, Care After °This sheet gives you information about how to care for yourself after your procedure. Your health care provider may also give you more specific instructions. If you have problems or questions, contact your health care provider. °What can I expect after the procedure? °After your procedure, it is common to have: °· Discomfort at the port insertion site. °· Bruising on the skin over the port. This should improve over 3-4 days. ° °Follow these instructions at home: °Port care °· After your port is placed, you will get a manufacturer's information card. The card has information about your port. Keep this card with you at all times. °· Take care of the port as told by your health care provider. Ask your health care provider if you or a family member can get training for taking care of the port at home. A home health care nurse may also take care of the port. °· Make sure to remember what type of port you have. °Incision care °· Follow instructions from your health care provider about how to take care of your port insertion site. Make sure you: °? Wash your hands with soap and water before you change your bandage (dressing). If soap and water are not available, use hand sanitizer. °? Change your dressing as told by your health care provider. °? Leave stitches (sutures), skin glue, or adhesive strips in place. These skin closures may need to stay in place for 2 weeks or longer. If adhesive strip edges start to loosen and curl up, you may trim the loose edges. Do not remove adhesive strips completely unless your health care provider tells you to do that. °· Check your port insertion site every day for signs of infection. Check for: °? More redness, swelling, or pain. °? More fluid or blood. °? Warmth. °? Pus or a bad smell. °General instructions °· Do not take baths, swim, or use a hot tub until your health care provider approves. °· Do not lift anything that is heavier than 10 lb (4.5  kg) for a week, or as told by your health care provider. °· Ask your health care provider when it is okay to: °? Return to work or school. °? Resume usual physical activities or sports. °· Do not drive for 24 hours if you were given a medicine to help you relax (sedative). °· Take over-the-counter and prescription medicines only as told by your health care provider. °· Wear a medical alert bracelet in case of an emergency. This will tell any health care providers that you have a port. °· Keep all follow-up visits as told by your health care provider. This is important. °Contact a health care provider if: °· You cannot flush your port with saline as directed, or you cannot draw blood from the port. °· You have a fever or chills. °· You have more redness, swelling, or pain around your port insertion site. °· You have more fluid or blood coming from your port insertion site. °· Your port insertion site feels warm to the touch. °· You have pus or a bad smell coming from the port insertion site. °Get help right away if: °· You have chest pain or shortness of breath. °· You have bleeding from your port that you cannot control. °Summary °· Take care of the port as told by your health care provider. °· Change your dressing as told by your health care provider. °· Keep all follow-up visits as told by your health care provider. °  This information is not intended to replace advice given to you by your health care provider. Make sure you discuss any questions you have with your health care provider. Document Released: 03/08/2013 Document Revised: 04/08/2016 Document Reviewed: 04/08/2016 Elsevier Interactive Patient Education  2017 [place]. Moderate Conscious Sedation, Adult, Care After These instructions provide you with information about caring for yourself after your procedure. Your health care provider may also give you more specific instructions. Your treatment has been planned according to current medical  practices, but problems sometimes occur. Call your health care provider if you have any problems or questions after your procedure. What can I expect after the procedure? After your procedure, it is common:  To feel sleepy for several hours.  To feel clumsy and have poor balance for several hours.  To have poor judgment for several hours.  To vomit if you eat too soon.  Follow these instructions at home: For at least 24 hours after the procedure:   Do not: ? Participate in activities where you could fall or become injured. ? Drive. ? Use heavy machinery. ? Drink alcohol. ? Take sleeping pills or medicines that cause drowsiness. ? Make important decisions or sign legal documents. ? Take care of children on your own.  Rest. Eating and drinking  Follow the diet recommended by your health care provider.  If you vomit: ? Drink water, juice, or soup when you can drink without vomiting. ? Make sure you have little or no nausea before eating solid foods. General instructions  Have a responsible adult stay with you until you are awake and alert.  Take over-the-counter and prescription medicines only as told by your health care provider.  If you smoke, do not smoke without supervision.  Keep all follow-up visits as told by your health care provider. This is important. Contact a health care provider if:  You keep feeling nauseous or you keep vomiting.  You feel light-headed.  You develop a rash.  You have a fever. Get help right away if:  You have trouble breathing. This information is not intended to replace advice given to you by your health care provider. Make sure you discuss any questions you have with your health care provider. Document Released: 03/08/2013 Document Revised: 10/21/2015 Document Reviewed: 09/07/2015 Elsevier Interactive Patient Education  2018 [place] An implanted port is a type of central line that is placed under  the skin. Central lines are used to provide IV access when treatment or nutrition needs to be given through a persons veins. Implanted ports are used for long-term IV access. An implanted port may be placed because:  You need IV medicine that would be irritating to the small veins in your hands or arms.  You need long-term IV medicines, such as antibiotics.  You need IV nutrition for a long period.  You need frequent blood draws for lab tests.  You need dialysis.  Implanted ports are usually placed in the chest area, but they can also be placed in the upper arm, the abdomen, or the leg. An implanted port has two main parts:  Reservoir. The reservoir is round and will appear as a small, raised area under your skin. The reservoir is the part where a needle is inserted to give medicines or draw blood.  Catheter. The catheter is a thin, flexible tube that extends from the reservoir. The catheter is placed into a large vein. Medicine that is inserted into the reservoir goes into the  catheter and then into the vein.  How will I care for my incision site? Do not get the incision site wet. Bathe or shower as directed by your health care provider. How is my port accessed? Special steps must be taken to access the port:  Before the port is accessed, a numbing cream can be placed on the skin. This helps numb the skin over the port site.  Your health care provider uses a sterile technique to access the port. ? Your health care provider must put on a mask and sterile gloves. ? The skin over your port is cleaned carefully with an antiseptic and allowed to dry. ? The port is gently pinched between sterile gloves, and a needle is inserted into the port.  Only "non-coring" port needles should be used to access the port. Once the port is accessed, a blood return should be checked. This helps ensure that the port is in the vein and is not clogged.  If your port needs to remain accessed for a constant  infusion, a clear (transparent) bandage will be placed over the needle site. The bandage and needle will need to be changed every week, or as directed by your health care provider.  Keep the bandage covering the needle clean and dry. Do not get it wet. Follow your health care providers instructions on how to take a shower or bath while the port is accessed.  If your port does not need to stay accessed, no bandage is needed over the port.  What is flushing? Flushing helps keep the port from getting clogged. Follow your health care providers instructions on how and when to flush the port. Ports are usually flushed with saline solution or a medicine called heparin. The need for flushing will depend on how the port is used.  If the port is used for intermittent medicines or blood draws, the port will need to be flushed: ? After medicines have been given. ? After blood has been drawn. ? As part of routine maintenance.  If a constant infusion is running, the port may not need to be flushed.  How long will my port stay implanted? The port can stay in for as long as your health care provider thinks it is needed. When it is time for the port to come out, surgery will be done to remove it. The procedure is similar to the one performed when the port was put in. When should I seek immediate medical care? When you have an implanted port, you should seek immediate medical care if:  You notice a bad smell coming from the incision site.  You have swelling, redness, or drainage at the incision site.  You have more swelling or pain at the port site or the surrounding area.  You have a fever that is not controlled with medicine.  This information is not intended to replace advice given to you by your health care provider. Make sure you discuss any questions you have with your health care provider. Document Released: 05/18/2005 Document Revised: 10/24/2015 Document Reviewed: 01/23/2013 Elsevier  Interactive Patient Education  2017 [employer].

## 2017-09-30 ENCOUNTER — Encounter: Payer: Self-pay | Admitting: Vascular Surgery

## 2017-09-30 LAB — CULTURE, FUNGUS WITHOUT SMEAR: Special Requests: NORMAL

## 2017-10-01 ENCOUNTER — Inpatient Hospital Stay: Payer: Medicare HMO | Attending: Oncology

## 2017-10-01 ENCOUNTER — Other Ambulatory Visit: Payer: Self-pay | Admitting: *Deleted

## 2017-10-01 DIAGNOSIS — C3491 Malignant neoplasm of unspecified part of right bronchus or lung: Secondary | ICD-10-CM | POA: Insufficient documentation

## 2017-10-01 DIAGNOSIS — Z510 Encounter for antineoplastic radiation therapy: Secondary | ICD-10-CM | POA: Insufficient documentation

## 2017-10-01 DIAGNOSIS — F17210 Nicotine dependence, cigarettes, uncomplicated: Secondary | ICD-10-CM | POA: Insufficient documentation

## 2017-10-01 DIAGNOSIS — Z5111 Encounter for antineoplastic chemotherapy: Secondary | ICD-10-CM | POA: Insufficient documentation

## 2017-10-01 DIAGNOSIS — E538 Deficiency of other specified B group vitamins: Secondary | ICD-10-CM | POA: Insufficient documentation

## 2017-10-01 MED ORDER — LIDOCAINE-PRILOCAINE 2.5-2.5 % EX CREA: TOPICAL_CREAM | CUTANEOUS | 3 refills | Status: DC

## 2017-10-01 MED ORDER — ONDANSETRON HCL 8 MG PO TABS: 8.0000 mg | ORAL_TABLET | Freq: Two times a day (BID) | ORAL | 1 refills | Status: DC | PRN

## 2017-10-01 MED ORDER — PROCHLORPERAZINE MALEATE 10 MG PO TABS: 10.0000 mg | ORAL_TABLET | Freq: Four times a day (QID) | ORAL | 1 refills | Status: DC | PRN

## 2017-10-05 ENCOUNTER — Ambulatory Visit
Admission: RE | Admit: 2017-10-05 | Discharge: 2017-10-05 | Disposition: A | Discharge status: Home / Self Care | Payer: Medicare HMO | Source: Ambulatory Visit | Attending: Oncology | Admitting: Oncology

## 2017-10-05 ENCOUNTER — Telehealth: Payer: Self-pay | Admitting: *Deleted

## 2017-10-05 DIAGNOSIS — G9389 Other specified disorders of brain: Secondary | ICD-10-CM | POA: Insufficient documentation

## 2017-10-05 DIAGNOSIS — I671 Cerebral aneurysm, nonruptured: Secondary | ICD-10-CM | POA: Insufficient documentation

## 2017-10-05 DIAGNOSIS — D7589 Other specified diseases of blood and blood-forming organs: Secondary | ICD-10-CM

## 2017-10-05 DIAGNOSIS — C3491 Malignant neoplasm of unspecified part of right bronchus or lung: Secondary | ICD-10-CM | POA: Diagnosis present

## 2017-10-05 MED ORDER — IOPAMIDOL (ISOVUE-370) INJECTION 76%
60.0000 mL | Freq: Once | INTRAVENOUS | Status: AC | PRN
  Administered 2017-10-05: 60 mL via INTRAVENOUS

## 2017-10-05 NOTE — Telephone Encounter (Signed)
Received Prior Authorization request from pharmacy for Ondansetron 8mg .   Prior Authorization completed verbally via phone.   Prior Authorization approved until 05/31/18.

## 2017-10-07 ENCOUNTER — Ambulatory Visit
Admission: RE | Admit: 2017-10-07 | Discharge: 2017-10-07 | Disposition: A | Discharge status: Home / Self Care | Payer: Medicare HMO | Source: Ambulatory Visit | Attending: Radiation Oncology | Admitting: Radiation Oncology

## 2017-10-11 ENCOUNTER — Encounter: Payer: Self-pay | Admitting: Oncology

## 2017-10-11 ENCOUNTER — Inpatient Hospital Stay (HOSPITAL_BASED_OUTPATIENT_CLINIC_OR_DEPARTMENT_OTHER): Payer: Medicare HMO | Admitting: Oncology

## 2017-10-11 ENCOUNTER — Inpatient Hospital Stay: Payer: Medicare HMO

## 2017-10-11 ENCOUNTER — Encounter: Payer: Self-pay | Admitting: *Deleted

## 2017-10-11 ENCOUNTER — Ambulatory Visit
Admission: RE | Admit: 2017-10-11 | Discharge: 2017-10-11 | Disposition: A | Discharge status: Home / Self Care | Payer: Medicare HMO | Source: Ambulatory Visit | Attending: Radiation Oncology | Admitting: Radiation Oncology

## 2017-10-11 VITALS — BP 165/95 | HR 70 | Temp 97.4°F | Resp 18 | Ht 62.0 in | Wt 187.0 lb

## 2017-10-11 VITALS — BP 185/90 | HR 75 | Temp 97.3°F | Resp 16

## 2017-10-11 DIAGNOSIS — Z5111 Encounter for antineoplastic chemotherapy: Secondary | ICD-10-CM | POA: Diagnosis present

## 2017-10-11 DIAGNOSIS — E538 Deficiency of other specified B group vitamins: Secondary | ICD-10-CM | POA: Diagnosis not present

## 2017-10-11 DIAGNOSIS — R05 Cough: Secondary | ICD-10-CM

## 2017-10-11 DIAGNOSIS — D7589 Other specified diseases of blood and blood-forming organs: Secondary | ICD-10-CM

## 2017-10-11 DIAGNOSIS — C3491 Malignant neoplasm of unspecified part of right bronchus or lung: Secondary | ICD-10-CM | POA: Diagnosis not present

## 2017-10-11 DIAGNOSIS — Z720 Tobacco use: Secondary | ICD-10-CM

## 2017-10-11 DIAGNOSIS — Z510 Encounter for antineoplastic radiation therapy: Secondary | ICD-10-CM | POA: Diagnosis not present

## 2017-10-11 LAB — CBC WITH DIFFERENTIAL/PLATELET
BASOS PCT: 1 %
Basophils Absolute: 0.1 10*3/uL (ref 0–0.1)
EOS PCT: 7 %
Eosinophils Absolute: 0.5 10*3/uL (ref 0–0.7)
HEMATOCRIT: 34.9 % — AB (ref 35.0–47.0)
Hemoglobin: 12.4 g/dL (ref 12.0–16.0)
Lymphocytes Relative: 32 %
Lymphs Abs: 2.4 10*3/uL (ref 1.0–3.6)
MCH: 36.5 pg — ABNORMAL HIGH (ref 26.0–34.0)
MCHC: 35.6 g/dL (ref 32.0–36.0)
MCV: 102.6 fL — ABNORMAL HIGH (ref 80.0–100.0)
MONOS PCT: 6 %
Monocytes Absolute: 0.4 10*3/uL (ref 0.2–0.9)
Neutro Abs: 3.9 10*3/uL (ref 1.4–6.5)
Neutrophils Relative %: 54 %
PLATELETS: 257 10*3/uL (ref 150–440)
RBC: 3.4 MIL/uL — ABNORMAL LOW (ref 3.80–5.20)
RDW: 12.2 % (ref 11.5–14.5)
WBC: 7.3 10*3/uL (ref 3.6–11.0)

## 2017-10-11 LAB — COMPREHENSIVE METABOLIC PANEL
ALBUMIN: 4 g/dL (ref 3.5–5.0)
ALK PHOS: 100 U/L (ref 38–126)
ALT: 22 U/L (ref 14–54)
AST: 28 U/L (ref 15–41)
Anion gap: 9 (ref 5–15)
BILIRUBIN TOTAL: 0.6 mg/dL (ref 0.3–1.2)
BUN: 12 mg/dL (ref 6–20)
CALCIUM: 9.1 mg/dL (ref 8.9–10.3)
CO2: 23 mmol/L (ref 22–32)
CREATININE: 0.61 mg/dL (ref 0.44–1.00)
Chloride: 104 mmol/L (ref 101–111)
GFR calc Af Amer: >60 mL/min (ref >60)
GFR calc non Af Amer: >60 mL/min (ref >60)
Glucose, Bld: 120 mg/dL — ABNORMAL HIGH (ref 65–99)
Potassium: 3.6 mmol/L (ref 3.5–5.1)
Sodium: 136 mmol/L (ref 135–145)
Total Protein: 7.3 g/dL (ref 6.5–8.1)

## 2017-10-11 LAB — VITAMIN B12: VITAMIN B 12: 224 pg/mL (ref 180–914)

## 2017-10-11 LAB — FOLATE: Folate: 14.9 ng/mL (ref >5.9)

## 2017-10-11 MED ORDER — PACLITAXEL CHEMO INJECTION 300 MG/50ML
45.0000 mg/m2 | Freq: Once | INTRAVENOUS | Status: AC
  Administered 2017-10-11: 84 mg via INTRAVENOUS
  Filled 2017-10-11: qty 14

## 2017-10-11 MED ORDER — SODIUM CHLORIDE 0.9 % IV SOLN
190.0000 mg | Freq: Once | INTRAVENOUS | Status: AC
  Administered 2017-10-11: 190 mg via INTRAVENOUS
  Filled 2017-10-11: qty 19

## 2017-10-11 MED ORDER — DIPHENHYDRAMINE HCL 50 MG/ML IJ SOLN
50.0000 mg | Freq: Once | INTRAMUSCULAR | Status: AC
  Administered 2017-10-11: 50 mg via INTRAVENOUS
  Filled 2017-10-11: qty 1

## 2017-10-11 MED ORDER — PALONOSETRON HCL INJECTION 0.25 MG/5ML
0.2500 mg | Freq: Once | INTRAVENOUS | Status: AC
  Administered 2017-10-11: 0.25 mg via INTRAVENOUS
  Filled 2017-10-11: qty 5

## 2017-10-11 MED ORDER — HEPARIN SOD (PORK) LOCK FLUSH 100 UNIT/ML IV SOLN
500.0000 [IU] | Freq: Once | INTRAVENOUS | Status: AC
  Administered 2017-10-11: 500 [IU] via INTRAVENOUS
  Filled 2017-10-11: qty 5

## 2017-10-11 MED ORDER — SODIUM CHLORIDE 0.9 % IV SOLN
Freq: Once | INTRAVENOUS | Status: AC
  Administered 2017-10-11: 10:00:00 via INTRAVENOUS
  Filled 2017-10-11: qty 1000

## 2017-10-11 MED ORDER — SODIUM CHLORIDE 0.9% FLUSH
10.0000 mL | INTRAVENOUS | Status: DC | PRN
  Administered 2017-10-11: 10 mL via INTRAVENOUS
  Filled 2017-10-11: qty 10

## 2017-10-11 MED ORDER — FAMOTIDINE IN NACL 20-0.9 MG/50ML-% IV SOLN
20.0000 mg | Freq: Once | INTRAVENOUS | Status: AC
  Administered 2017-10-11: 20 mg via INTRAVENOUS
  Filled 2017-10-11: qty 50

## 2017-10-11 MED ORDER — SODIUM CHLORIDE 0.9 % IV SOLN
20.0000 mg | Freq: Once | INTRAVENOUS | Status: AC
  Administered 2017-10-11: 20 mg via INTRAVENOUS
  Filled 2017-10-11: qty 2

## 2017-10-11 NOTE — Progress Notes (Signed)
No new changes noted today 

## 2017-10-11 NOTE — Progress Notes (Signed)
  Oncology Nurse Navigator Documentation  Navigator Location: [facility] (10/11/17 1000)   )Navigator Encounter Type: Treatment (10/11/17 1000)   Abnormal Finding Date: 05/27/17 (10/11/17 1000) Confirmed Diagnosis Date: 09/14/17 (10/11/17 1000)             Treatment Initiated Date: 10/11/17 (10/11/17 1000) Patient Visit Type: MedOnc (10/11/17 1000) Treatment Phase: First Chemo Tx;First Radiation Tx (10/11/17 1000) Barriers/Navigation Needs: No barriers at this time;No Needs (10/11/17 1000)   Interventions: None required (10/11/17 1000)             met with patient prior to receiving first chemo treatment today. All questions answered at the time of visit. Reviewed with patient what to expect today and reviewed upcoming appts with pt. Pt voiced no further needs at this time. Encouraged pt to call if has any further questions and to call triage nurse if experiences side effects from treatment that are not tolerable. Pt verbalized understanding. Nothing further needed at this time.         Time Spent with Patient: 30 (10/11/17 1000)

## 2017-10-11 NOTE — Progress Notes (Signed)
Hematology/Oncology follow up note [facility] Telephone:(336) [phone] Fax:(336) [phone]   Patient Care Team: Sharyne Peach, MD as PCP - General (Family Medicine) Telford Nab, RN as Registered Nurse  REASON FOR VISIT Follow up for chemotherapy treatment of lung cancer. HISTORY OF PRESENTING ILLNESS:  Connie Powers is a  67 y.o.  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  CT lung cancer screen 05/27/2017   1. A few bilateral pulmonary nodules measuring up to 5 mm are indeterminate. Considering the suspicious 2.0 cm mediastinal adenopathy, follow up with PET/CT is recommended. 2. Mild bronchial wall thickening could represent airway disease including bronchitis.ACR Lung-RADS Category and Recommendation*: ACR Lung-RADS Category 2S (S modifier for mediastinal adenopathy)  PET scan 3/112019 1. No hypermetabolic pulmonary nodules.2. Hypermetabolic enlarged right paratracheal lymph node, of uncertain etiology in isolation. Lymphoproliferative disorder cannot be excluded. 3.  Aortic atherosclerosis (ICD10-170.0).  EBUS biopsy of paratracheal node showed non small cell lung cancer favoring adenocarcinoma. Case was discussed on tumor board on 09/16/2017 and consensus recommendation is to treat as Stage IIIA disease given the invasion of mediastinum.   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 MRI brain is contraindicated. CT brain negative for metastatic disease.  INTERVAL HISTORY Connie Powers is a 67 y.o. 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 She has been evaluated by Radonc and will start concurrent RT today.  She reports feeling well, chronic non productive cough, She has antiemetics, EMLA cream at home.  Denies weight loss, hemoptysis, SOB, chest pain.  She has  been working on smoke cessation, only smoke 3-4 cigarettes a day.    Review of Systems  Constitutional: Negative for chills, fever, malaise/fatigue and weight loss.  HENT: Negative for congestion, ear discharge, ear pain, hearing loss, nosebleeds, sinus pain and sore throat.   Eyes: Negative for double vision, photophobia, pain, discharge and redness.  Respiratory: Negative for cough, hemoptysis, sputum production, shortness of breath and wheezing.   Cardiovascular: Negative for chest pain, palpitations, orthopnea, claudication and leg swelling.  Gastrointestinal: Negative for abdominal pain, blood in stool, constipation, diarrhea, heartburn, melena, nausea and vomiting.  Genitourinary: Negative for dysuria, flank pain, frequency and hematuria.  Musculoskeletal: Negative for back pain, myalgias and neck pain.  Skin: Negative for itching and rash.  Neurological: Negative for dizziness, tingling, tremors, sensory change, focal weakness, weakness and headaches.  Endo/Heme/Allergies: Negative for environmental allergies. Does not bruise/bleed easily.  Psychiatric/Behavioral: Negative for depression, hallucinations and substance abuse. The patient is not nervous/anxious.     MEDICAL HISTORY:  Past Medical History:  Diagnosis Date  . Brain aneurysm   . Difficult intubation   . Non-small cell cancer of right lung ([place]) 09/22/2017  . Viral meningitis     SURGICAL HISTORY: Past Surgical History:  Procedure Laterality Date  . ENDOBRONCHIAL ULTRASOUND N/A 09/09/2017   Procedure: ENDOBRONCHIAL ULTRASOUND;  Surgeon: Laverle Hobby, MD;  Location: [facility] ORS;  Service: Pulmonary;  Laterality: N/A;  . OOPHORECTOMY Left   . PORTA CATH INSERTION N/A 09/29/2017   Procedure: PORTA CATH INSERTION;  Surgeon: Algernon Huxley, MD;  Location: [place] [facility];  Service: Cardiovascular;  Laterality: N/A;  . surgical repair for brain tumor  1969   clips in head  . WISDOM TOOTH EXTRACTION  SOCIAL  HISTORY: Social History   Socioeconomic History  . Marital status: Married    Spouse name: Not on file  . Number of children: Not on file  . Years of education: Not on file  . Highest education level: Not on file  Occupational History  . Not on file  Social Needs  . Financial resource strain: Not on file  . Food insecurity:    Worry: Not on file    Inability: Not on file  . Transportation needs:    Medical: Not on file    Non-medical: Not on file  Tobacco Use  . Smoking status: Current Every Day Smoker    Packs/day: 0.25  . Smokeless tobacco: Never Used  . Tobacco comment: currently at 4 cigarettes a day  Substance and Sexual Activity  . Alcohol use: Yes    Comment: social  . Drug use: Yes    Types: Cocaine  . Sexual activity: Not on file  Lifestyle  . Physical activity:    Days per week: Not on file    Minutes per session: Not on file  . Stress: Not on file  Relationships  . Social connections:    Talks on phone: Not on file    Gets together: Not on file    Attends religious service: Not on file    Active member of club or organization: Not on file    Attends meetings of clubs or organizations: Not on file    Relationship status: Not on file  . Intimate partner violence:    Fear of current or ex partner: Not on file    Emotionally abused: Not on file    Physically abused: Not on file    Forced sexual activity: Not on file  Other Topics Concern  . Not on file  Social History Narrative  . Not on file    FAMILY HISTORY: Family History  Problem Relation Age of Onset  . Diabetes Mother   . Hypertension Mother   . Hyperlipidemia Mother   . Arthritis Mother   . Hypertension Father   . Hyperlipidemia Father   . Heart attack Father        x2  . Breast cancer Neg Hx     ALLERGIES:  has No Known Allergies.  MEDICATIONS:  Current Outpatient Medications  Medication Sig Dispense Refill  . aspirin EC 81 MG tablet Take 81 mg by mouth daily.    Marland Kitchen  lidocaine-prilocaine (EMLA) cream Apply to affected area once 30 g 3  . umeclidinium-vilanterol (ANORO ELLIPTA) 62.5-25 MCG/INH AEPB Inhale 1 puff into the lungs daily as needed (shortness of breath).     . ondansetron (ZOFRAN) 8 MG tablet Take 1 tablet (8 mg total) by mouth 2 (two) times daily as needed for refractory nausea / vomiting. Start on day 3 after chemo. (Patient not taking: Reported on 10/11/2017) 30 tablet 1  . prochlorperazine (COMPAZINE) 10 MG tablet Take 1 tablet (10 mg total) by mouth every 6 (six) hours as needed (Nausea or vomiting). (Patient not taking: Reported on 10/11/2017) 30 tablet 1   No current facility-administered medications for this visit.    Facility-Administered Medications Ordered in Other Visits  Medication Dose Route Frequency Provider Last Rate Last Dose  . CARBOplatin (PARAPLATIN) 190 mg in sodium chloride 0.9 % 250 mL chemo infusion  190 mg Intravenous Once Earlie Server, MD      . heparin lock flush 100 unit/mL  500 Units Intravenous Once Earlie Server, MD      .  sodium chloride flush (NS) 0.9 % injection 10 mL  10 mL Intravenous PRN Earlie Server, MD   10 mL at 10/11/17 0835     PHYSICAL EXAMINATION: ECOG PERFORMANCE STATUS: 0 - Asymptomatic Vitals:   10/11/17 0848  BP: (!) 165/95  Pulse: 70  Resp: 18  Temp: (!) 97.4 F (36.3 C)  SpO2: 99%   Filed Weights   10/11/17 0848  Weight: 187 lb (84.8 kg)    Physical Exam  Constitutional: She is oriented to person, place, and time. She appears well-developed and well-nourished. No distress.  HENT:  Head: Normocephalic and atraumatic.  Right Ear: External ear normal.  Left Ear: External ear normal.  Mouth/Throat: Oropharynx is clear and moist.  Eyes: Pupils are equal, round, and reactive to light. Conjunctivae and EOM are normal. Left eye exhibits no discharge. No scleral icterus.  Neck: Normal range of motion. Neck supple.  Cardiovascular: Normal rate, regular rhythm and normal heart sounds.  No murmur  heard. Pulmonary/Chest: Effort normal and breath sounds normal. No respiratory distress. She has no wheezes. She has no rales. She exhibits no tenderness.  Abdominal: Soft. Bowel sounds are normal. She exhibits no distension and no mass. There is no tenderness. There is no guarding.  Musculoskeletal: Normal range of motion. She exhibits no edema or deformity.  Lymphadenopathy:    She has no cervical adenopathy.  Neurological: She is alert and oriented to person, place, and time. No cranial nerve deficit. Coordination normal.  Skin: Skin is warm and dry. No rash noted.  Psychiatric: She has a normal mood and affect. Her behavior is normal. Thought content normal.     LABORATORY DATA:  I have reviewed the data as listed Lab Results  Component Value Date   WBC 7.3 10/11/2017   HGB 12.4 10/11/2017   HCT 34.9 (L) 10/11/2017   MCV 102.6 (H) 10/11/2017   PLT 257 10/11/2017   Recent Labs    09/07/17 1334 10/11/17 0838  NA 140 136  K 3.5 3.6  CL 107 104  CO2 26 23  GLUCOSE 123* 120*  BUN 6 12  CREATININE 0.49 0.61  CALCIUM 8.8* 9.1  GFRNONAA >60 >60  GFRAA >60 >60  PROT  --  7.3  ALBUMIN  --  4.0  AST  --  28  ALT  --  22  ALKPHOS  --  100  BILITOT  --  0.6       ASSESSMENT & PLAN:  Cancer Staging Non-small cell cancer of right lung (HCC) Staging form: Lung, AJCC 8th Edition - Clinical stage from 09/22/2017: Stage Unknown (cTX, cN1, cM0) - Signed by Earlie Server, MD on 09/22/2017 - Pathologic: No stage assigned - Unsigned  1. Non-small cell cancer of right lung ([place])   2. Tobacco abuse   3. Macrocytosis without anemia    # Proceed with cycle 1 Carbo and Taxol with concurrent Radiation.  Patient has antiemetics-Zofran and Compazine; EMLA cream at home.   # Smoke cessation discussed again.   # Macrocytosis, will check B12, and Folate. Results pending.   All questions were answered. The patient knows to call the clinic with any problems questions or concerns.  Return  of visit: 1 week for assessment prior to cycl 2 chemotherapy.  Earlie Server, MD, PhD Hematology Oncology [facility] at [facility] Pager- [phone] 10/11/2017

## 2017-10-12 ENCOUNTER — Ambulatory Visit
Admission: RE | Admit: 2017-10-12 | Discharge: 2017-10-12 | Disposition: A | Discharge status: Home / Self Care | Payer: Medicare HMO | Source: Ambulatory Visit | Attending: Radiation Oncology | Admitting: Radiation Oncology

## 2017-10-12 DIAGNOSIS — Z510 Encounter for antineoplastic radiation therapy: Secondary | ICD-10-CM | POA: Diagnosis not present

## 2017-10-13 ENCOUNTER — Ambulatory Visit
Admission: RE | Admit: 2017-10-13 | Discharge: 2017-10-13 | Disposition: A | Discharge status: Home / Self Care | Payer: Medicare HMO | Source: Ambulatory Visit | Attending: Radiation Oncology | Admitting: Radiation Oncology

## 2017-10-13 DIAGNOSIS — Z510 Encounter for antineoplastic radiation therapy: Secondary | ICD-10-CM | POA: Diagnosis not present

## 2017-10-14 ENCOUNTER — Ambulatory Visit
Admission: RE | Admit: 2017-10-14 | Discharge: 2017-10-14 | Disposition: A | Discharge status: Home / Self Care | Payer: Medicare HMO | Source: Ambulatory Visit | Attending: Radiation Oncology | Admitting: Radiation Oncology

## 2017-10-14 DIAGNOSIS — Z510 Encounter for antineoplastic radiation therapy: Secondary | ICD-10-CM | POA: Diagnosis not present

## 2017-10-15 ENCOUNTER — Ambulatory Visit
Admission: RE | Admit: 2017-10-15 | Discharge: 2017-10-15 | Disposition: A | Discharge status: Home / Self Care | Payer: Medicare HMO | Source: Ambulatory Visit | Attending: Radiation Oncology | Admitting: Radiation Oncology

## 2017-10-15 DIAGNOSIS — Z510 Encounter for antineoplastic radiation therapy: Secondary | ICD-10-CM | POA: Diagnosis not present

## 2017-10-18 ENCOUNTER — Inpatient Hospital Stay: Payer: Medicare HMO

## 2017-10-18 ENCOUNTER — Encounter: Payer: Self-pay | Admitting: Oncology

## 2017-10-18 ENCOUNTER — Other Ambulatory Visit: Payer: Self-pay

## 2017-10-18 ENCOUNTER — Ambulatory Visit
Admission: RE | Admit: 2017-10-18 | Discharge: 2017-10-18 | Disposition: A | Discharge status: Home / Self Care | Payer: Medicare HMO | Source: Ambulatory Visit | Attending: Radiation Oncology | Admitting: Radiation Oncology

## 2017-10-18 ENCOUNTER — Inpatient Hospital Stay: Payer: Medicare HMO | Admitting: Oncology

## 2017-10-18 VITALS — BP 160/71 | HR 61 | Temp 97.8°F | Wt 188.5 lb

## 2017-10-18 DIAGNOSIS — Z5111 Encounter for antineoplastic chemotherapy: Secondary | ICD-10-CM | POA: Diagnosis not present

## 2017-10-18 DIAGNOSIS — C3491 Malignant neoplasm of unspecified part of right bronchus or lung: Secondary | ICD-10-CM | POA: Diagnosis not present

## 2017-10-18 DIAGNOSIS — D519 Vitamin B12 deficiency anemia, unspecified: Secondary | ICD-10-CM

## 2017-10-18 DIAGNOSIS — D518 Other vitamin B12 deficiency anemias: Secondary | ICD-10-CM

## 2017-10-18 DIAGNOSIS — R740 Nonspecific elevation of levels of transaminase and lactic acid dehydrogenase [LDH]: Secondary | ICD-10-CM

## 2017-10-18 DIAGNOSIS — Z720 Tobacco use: Secondary | ICD-10-CM | POA: Insufficient documentation

## 2017-10-18 DIAGNOSIS — Z510 Encounter for antineoplastic radiation therapy: Secondary | ICD-10-CM | POA: Diagnosis not present

## 2017-10-18 DIAGNOSIS — D7589 Other specified diseases of blood and blood-forming organs: Secondary | ICD-10-CM | POA: Insufficient documentation

## 2017-10-18 DIAGNOSIS — E538 Deficiency of other specified B group vitamins: Secondary | ICD-10-CM | POA: Insufficient documentation

## 2017-10-18 DIAGNOSIS — R7401 Elevation of levels of liver transaminase levels: Secondary | ICD-10-CM

## 2017-10-18 LAB — COMPREHENSIVE METABOLIC PANEL
ALBUMIN: 3.9 g/dL (ref 3.5–5.0)
ALK PHOS: 103 U/L (ref 38–126)
ALT: 83 U/L — AB (ref 14–54)
AST: 76 U/L — AB (ref 15–41)
Anion gap: 9 (ref 5–15)
BILIRUBIN TOTAL: 0.6 mg/dL (ref 0.3–1.2)
BUN: 9 mg/dL (ref 6–20)
CALCIUM: 9.1 mg/dL (ref 8.9–10.3)
CO2: 24 mmol/L (ref 22–32)
CREATININE: 0.56 mg/dL (ref 0.44–1.00)
Chloride: 103 mmol/L (ref 101–111)
GFR calc Af Amer: >60 mL/min (ref >60)
GFR calc non Af Amer: >60 mL/min (ref >60)
GLUCOSE: 158 mg/dL — AB (ref 65–99)
POTASSIUM: 4.3 mmol/L (ref 3.5–5.1)
Sodium: 136 mmol/L (ref 135–145)
TOTAL PROTEIN: 7.1 g/dL (ref 6.5–8.1)

## 2017-10-18 LAB — CBC WITH DIFFERENTIAL/PLATELET
BASOS ABS: 0 10*3/uL (ref 0–0.1)
Basophils Relative: 1 %
EOS PCT: 9 %
Eosinophils Absolute: 0.4 10*3/uL (ref 0–0.7)
HCT: 33.4 % — ABNORMAL LOW (ref 35.0–47.0)
Hemoglobin: 11.9 g/dL — ABNORMAL LOW (ref 12.0–16.0)
LYMPHS PCT: 27 %
Lymphs Abs: 1.1 10*3/uL (ref 1.0–3.6)
MCH: 36.6 pg — ABNORMAL HIGH (ref 26.0–34.0)
MCHC: 35.5 g/dL (ref 32.0–36.0)
MCV: 103.1 fL — AB (ref 80.0–100.0)
MONO ABS: 0.3 10*3/uL (ref 0.2–0.9)
Monocytes Relative: 7 %
Neutro Abs: 2.3 10*3/uL (ref 1.4–6.5)
Neutrophils Relative %: 56 %
PLATELETS: 262 10*3/uL (ref 150–440)
RBC: 3.24 MIL/uL — ABNORMAL LOW (ref 3.80–5.20)
RDW: 12.1 % (ref 11.5–14.5)
WBC: 4.1 10*3/uL (ref 3.6–11.0)

## 2017-10-18 MED ORDER — SODIUM CHLORIDE 0.9 % IV SOLN
190.0000 mg | Freq: Once | INTRAVENOUS | Status: AC
  Administered 2017-10-18: 190 mg via INTRAVENOUS
  Filled 2017-10-18: qty 19

## 2017-10-18 MED ORDER — PALONOSETRON HCL INJECTION 0.25 MG/5ML
0.2500 mg | Freq: Once | INTRAVENOUS | Status: AC
  Administered 2017-10-18: 0.25 mg via INTRAVENOUS
  Filled 2017-10-18: qty 5

## 2017-10-18 MED ORDER — SODIUM CHLORIDE 0.9 % IV SOLN
Freq: Once | INTRAVENOUS | Status: AC
  Administered 2017-10-18: 10:00:00 via INTRAVENOUS
  Filled 2017-10-18: qty 1000

## 2017-10-18 MED ORDER — HEPARIN SOD (PORK) LOCK FLUSH 100 UNIT/ML IV SOLN
500.0000 [IU] | Freq: Once | INTRAVENOUS | Status: AC
  Administered 2017-10-18: 500 [IU] via INTRAVENOUS
  Filled 2017-10-18: qty 5

## 2017-10-18 MED ORDER — FAMOTIDINE IN NACL 20-0.9 MG/50ML-% IV SOLN
20.0000 mg | Freq: Once | INTRAVENOUS | Status: AC
  Administered 2017-10-18: 20 mg via INTRAVENOUS
  Filled 2017-10-18: qty 50

## 2017-10-18 MED ORDER — SODIUM CHLORIDE 0.9 % IV SOLN
45.0000 mg/m2 | Freq: Once | INTRAVENOUS | Status: AC
  Administered 2017-10-18: 84 mg via INTRAVENOUS
  Filled 2017-10-18: qty 14

## 2017-10-18 MED ORDER — SODIUM CHLORIDE 0.9% FLUSH
10.0000 mL | INTRAVENOUS | Status: DC | PRN
  Administered 2017-10-18: 10 mL via INTRAVENOUS
  Filled 2017-10-18: qty 10

## 2017-10-18 MED ORDER — DIPHENHYDRAMINE HCL 50 MG/ML IJ SOLN
50.0000 mg | Freq: Once | INTRAMUSCULAR | Status: AC
  Administered 2017-10-18: 50 mg via INTRAVENOUS
  Filled 2017-10-18: qty 1

## 2017-10-18 MED ORDER — SODIUM CHLORIDE 0.9 % IV SOLN
20.0000 mg | Freq: Once | INTRAVENOUS | Status: AC
  Administered 2017-10-18: 20 mg via INTRAVENOUS
  Filled 2017-10-18: qty 2

## 2017-10-18 NOTE — Progress Notes (Addendum)
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   HISTORY OF PRESENTING ILLNESS:  Connie Powers is a  67 y.o.  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  CT lung cancer screen 05/27/2017   1. A few bilateral pulmonary nodules measuring up to 5 mm are indeterminate. Considering the suspicious 2.0 cm mediastinal adenopathy, follow up with PET/CT is recommended. 2. Mild bronchial wall thickening could represent airway disease including bronchitis.ACR Lung-RADS Category and Recommendation*: ACR Lung-RADS Category 2S (S modifier for mediastinal adenopathy)  PET scan 3/112019 1. No hypermetabolic pulmonary nodules.2. Hypermetabolic enlarged right paratracheal lymph node, of uncertain etiology in isolation. Lymphoproliferative disorder cannot be excluded. 3.  Aortic atherosclerosis (ICD10-170.0).  EBUS biopsy of paratracheal node showed non small cell lung cancer favoring adenocarcinoma. Case was discussed on tumor board on 09/16/2017 and consensus recommendation is to treat as Stage IIIA disease given the invasion of mediastinum.   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 MRI brain is contraindicated. CT brain negative for metastatic disease.  INTERVAL HISTORY Mikael Debell is a 67 y.o. 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  She is status post cycle 1 carboplatin and Taxol concurrent radiation.  Reports doing well.  Denies any nausea vomiting, diarrhea, or abdominal pain. Denies weight loss, hemoptysis, SOB, chest pain.  She  has been working on smoke cessation, only smoke 3-4 cigarettes a day.    Review of Systems  Constitutional: Negative for chills, fever, malaise/fatigue and weight loss.  HENT: Negative for congestion, ear discharge, ear pain, hearing loss, nosebleeds, sinus pain and sore throat.   Eyes: Negative for double vision, photophobia, pain, discharge and redness.  Respiratory: Negative for cough, hemoptysis, sputum production, shortness of breath and wheezing.   Cardiovascular: Negative for chest pain, palpitations, orthopnea, claudication and leg swelling.  Gastrointestinal: Negative for abdominal pain, blood in stool, constipation, diarrhea, heartburn, melena, nausea and vomiting.  Genitourinary: Negative for dysuria, flank pain, frequency and hematuria.  Musculoskeletal: Negative for back pain, myalgias and neck pain.  Skin: Negative for itching and rash.  Neurological: Negative for dizziness, tingling, tremors, sensory change, focal weakness, weakness and headaches.  Endo/Heme/Allergies: Negative for environmental allergies. Does not bruise/bleed easily.  Psychiatric/Behavioral: Negative for depression, hallucinations and substance abuse. The patient is not nervous/anxious.     MEDICAL HISTORY:  Past Medical History:  Diagnosis Date  . Brain aneurysm   . Difficult intubation   . Non-small cell cancer of right lung ([place]) 09/22/2017  . Viral meningitis     SURGICAL HISTORY: Past Surgical History:  Procedure Laterality Date  . ENDOBRONCHIAL ULTRASOUND N/A 09/09/2017   Procedure: ENDOBRONCHIAL ULTRASOUND;  Surgeon: Laverle Hobby, MD;  Location: [facility] ORS;  Service: Pulmonary;  Laterality: N/A;  . OOPHORECTOMY Left   . PORTA CATH INSERTION N/A 09/29/2017   Procedure: PORTA CATH INSERTION;  Surgeon: Algernon Huxley, MD;  Location: [place] [facility];  Service: Cardiovascular;  Laterality: N/A;  . surgical repair for brain tumor  1969   clips in head  . WISDOM TOOTH EXTRACTION  SOCIAL HISTORY: Social History   Socioeconomic History  . Marital status: Married    Spouse name: Not on file  . Number of children: Not on file  . Years of education: Not on file  . Highest education level: Not on file  Occupational History  . Not on file  Social Needs  . Financial resource strain: Not on file  . Food insecurity:    Worry: Not on file    Inability: Not on file  . Transportation needs:    Medical: Not on file    Non-medical: Not on file  Tobacco Use  . Smoking status: Current Every Day Smoker    Packs/day: 0.25  . Smokeless tobacco: Never Used  . Tobacco comment: currently at 4 cigarettes a day  Substance and Sexual Activity  . Alcohol use: Yes    Comment: social  . Drug use: Yes    Types: Cocaine  . Sexual activity: Not on file  Lifestyle  . Physical activity:    Days per week: Not on file    Minutes per session: Not on file  . Stress: Not on file  Relationships  . Social connections:    Talks on phone: Not on file    Gets together: Not on file    Attends religious service: Not on file    Active member of club or organization: Not on file    Attends meetings of clubs or organizations: Not on file    Relationship status: Not on file  . Intimate partner violence:    Fear of current or ex partner: Not on file    Emotionally abused: Not on file    Physically abused: Not on file    Forced sexual activity: Not on file  Other Topics Concern  . Not on file  Social History Narrative  . Not on file    FAMILY HISTORY: Family History  Problem Relation Age of Onset  . Diabetes Mother   . Hypertension Mother   . Hyperlipidemia Mother   . Arthritis Mother   . Hypertension Father   . Hyperlipidemia Father   . Heart attack Father        x2  . Breast cancer Neg Hx     ALLERGIES:  has No Known Allergies.  MEDICATIONS:  Current Outpatient Medications  Medication Sig Dispense Refill  . aspirin EC 81 MG tablet Take 81 mg by mouth daily.    Marland Kitchen  lidocaine-prilocaine (EMLA) cream Apply to affected area once 30 g 3  . ondansetron (ZOFRAN) 8 MG tablet Take 1 tablet (8 mg total) by mouth 2 (two) times daily as needed for refractory nausea / vomiting. Start on day 3 after chemo. 30 tablet 1  . prochlorperazine (COMPAZINE) 10 MG tablet Take 1 tablet (10 mg total) by mouth every 6 (six) hours as needed (Nausea or vomiting). 30 tablet 1  . umeclidinium-vilanterol (ANORO ELLIPTA) 62.5-25 MCG/INH AEPB Inhale 1 puff into the lungs daily as needed (shortness of breath).      No current facility-administered medications for this visit.    Facility-Administered Medications Ordered in Other Visits  Medication Dose Route Frequency Provider Last Rate Last Dose  . CARBOplatin (PARAPLATIN) 190 mg in sodium chloride 0.9 % 250 mL chemo infusion  190 mg Intravenous Once Earlie Server, MD      . heparin lock flush 100 unit/mL  500 Units Intravenous Once Earlie Server, MD      . PACLitaxel (TAXOL) 84 mg in sodium chloride 0.9 %  250 mL chemo infusion (</= 80mg /m2)  45 mg/m2 (Treatment Plan Recorded) Intravenous Once Earlie Server, MD 264 mL/hr at 10/18/17 1116 84 mg at 10/18/17 1116  . sodium chloride flush (NS) 0.9 % injection 10 mL  10 mL Intravenous PRN Earlie Server, MD   10 mL at 10/18/17 0922     PHYSICAL EXAMINATION: ECOG PERFORMANCE STATUS: 0 - Asymptomatic Vitals:   10/18/17 0949  BP: (!) 160/71  Pulse: 61  Temp: 97.8 F (36.6 C)  SpO2: 99%   Filed Weights   10/18/17 0949  Weight: 188 lb 8 oz (85.5 kg)    Physical Exam  Constitutional: She is oriented to person, place, and time. She appears well-developed and well-nourished. No distress.  HENT:  Head: Normocephalic and atraumatic.  Right Ear: External ear normal.  Left Ear: External ear normal.  Mouth/Throat: Oropharynx is clear and moist.  Eyes: Pupils are equal, round, and reactive to light. Conjunctivae and EOM are normal. Left eye exhibits no discharge. No scleral icterus.  Neck: Normal range of  motion. Neck supple.  Cardiovascular: Normal rate, regular rhythm and normal heart sounds.  No murmur heard. Pulmonary/Chest: Effort normal and breath sounds normal. No respiratory distress. She has no wheezes. She has no rales. She exhibits no tenderness.  Abdominal: Soft. Bowel sounds are normal. She exhibits no distension and no mass. There is no tenderness. There is no guarding.  Musculoskeletal: Normal range of motion. She exhibits no edema or deformity.  Lymphadenopathy:    She has no cervical adenopathy.  Neurological: She is alert and oriented to person, place, and time. No cranial nerve deficit. Coordination normal.  Skin: Skin is warm and dry. No rash noted.  Psychiatric: She has a normal mood and affect. Her behavior is normal. Thought content normal.     LABORATORY DATA:  I have reviewed the data as listed Lab Results  Component Value Date   WBC 4.1 10/18/2017   HGB 11.9 (L) 10/18/2017   HCT 33.4 (L) 10/18/2017   MCV 103.1 (H) 10/18/2017   PLT 262 10/18/2017   Recent Labs    09/07/17 1334 10/11/17 0838 10/18/17 0922  NA 140 136 136  K 3.5 3.6 4.3  CL 107 104 103  CO2 26 23 24   GLUCOSE 123* 120* 158*  BUN 6 12 9   CREATININE 0.49 0.61 0.56  CALCIUM 8.8* 9.1 9.1  GFRNONAA >60 >60 >60  GFRAA >60 >60 >60  PROT  --  7.3 7.1  ALBUMIN  --  4.0 3.9  AST  --  28 76*  ALT  --  22 83*  ALKPHOS  --  100 103  BILITOT  --  0.6 0.6       ASSESSMENT & PLAN:  Cancer Staging Non-small cell cancer of right lung (HCC) Staging form: Lung, AJCC 8th Edition - Clinical stage from 09/22/2017: Stage Unknown (cTX, cN1, cM0) - Signed by Earlie Server, MD on 09/22/2017 - Pathologic: No stage assigned - Unsigned  1. Non-small cell cancer of right lung ([place])   2. Macrocytic anemia with vitamin B12 deficiency   3. Tobacco abuse   4. Encounter for antineoplastic chemotherapy   5. Transaminitis   6. Low serum vitamin B12    # Lung cancer, stable, tolerates chemotherapy well.    Proceed with cycle 2 Carbo and Taxol with concurrent Radiation.   # Smoke cessation discussed again.   # Macrocytosis, Normal Folate. Low normal B12. Plan weekly B12 injection x 4.  # Mild transaminitis, due  to chemotherapy, continue to monitor.    All questions were answered. The patient knows to call the clinic with any problems questions or concerns.  Return of visit: 1 week for cycle 3 carbo and taxol, 2 week to see me prior to cycle 4 Carbo and taxol.  Earlie Server, MD, PhD Hematology Oncology [facility] at [facility] Pager- [phone] 10/18/2017

## 2017-10-18 NOTE — Progress Notes (Signed)
Patient here today for follow up.  Patient c/o constipation

## 2017-10-18 NOTE — Addendum Note (Signed)
Addended by: Earlie Server on: 10/18/2017 12:33 PM   Modules accepted: Orders

## 2017-10-19 ENCOUNTER — Ambulatory Visit
Admission: RE | Admit: 2017-10-19 | Discharge: 2017-10-19 | Disposition: A | Discharge status: Home / Self Care | Payer: Medicare HMO | Source: Ambulatory Visit | Attending: Radiation Oncology | Admitting: Radiation Oncology

## 2017-10-19 DIAGNOSIS — Z510 Encounter for antineoplastic radiation therapy: Secondary | ICD-10-CM | POA: Diagnosis not present

## 2017-10-20 ENCOUNTER — Ambulatory Visit
Admission: RE | Admit: 2017-10-20 | Discharge: 2017-10-20 | Disposition: A | Discharge status: Home / Self Care | Payer: Medicare HMO | Source: Ambulatory Visit | Attending: Radiation Oncology | Admitting: Radiation Oncology

## 2017-10-20 DIAGNOSIS — C3491 Malignant neoplasm of unspecified part of right bronchus or lung: Secondary | ICD-10-CM

## 2017-10-20 DIAGNOSIS — Z510 Encounter for antineoplastic radiation therapy: Secondary | ICD-10-CM | POA: Diagnosis not present

## 2017-10-21 ENCOUNTER — Ambulatory Visit
Admission: RE | Admit: 2017-10-21 | Discharge: 2017-10-21 | Disposition: A | Discharge status: Home / Self Care | Payer: Medicare HMO | Source: Ambulatory Visit | Attending: Radiation Oncology | Admitting: Radiation Oncology

## 2017-10-21 DIAGNOSIS — Z510 Encounter for antineoplastic radiation therapy: Secondary | ICD-10-CM | POA: Diagnosis not present

## 2017-10-22 ENCOUNTER — Ambulatory Visit
Admission: RE | Admit: 2017-10-22 | Discharge: 2017-10-22 | Disposition: A | Discharge status: Home / Self Care | Payer: Medicare HMO | Source: Ambulatory Visit | Attending: Radiation Oncology | Admitting: Radiation Oncology

## 2017-10-22 DIAGNOSIS — Z510 Encounter for antineoplastic radiation therapy: Secondary | ICD-10-CM | POA: Diagnosis not present

## 2017-10-23 LAB — ACID FAST CULTURE WITH REFLEXED SENSITIVITIES: ACID FAST CULTURE - AFSCU3: NEGATIVE

## 2017-10-26 ENCOUNTER — Ambulatory Visit
Admission: RE | Admit: 2017-10-26 | Discharge: 2017-10-26 | Disposition: A | Discharge status: Home / Self Care | Payer: Medicare HMO | Source: Ambulatory Visit | Attending: Radiation Oncology | Admitting: Radiation Oncology

## 2017-10-26 ENCOUNTER — Inpatient Hospital Stay: Payer: Medicare HMO

## 2017-10-26 VITALS — BP 154/79 | HR 69 | Temp 97.9°F | Resp 18 | Wt 189.1 lb

## 2017-10-26 DIAGNOSIS — Z510 Encounter for antineoplastic radiation therapy: Secondary | ICD-10-CM | POA: Diagnosis not present

## 2017-10-26 DIAGNOSIS — C3491 Malignant neoplasm of unspecified part of right bronchus or lung: Secondary | ICD-10-CM

## 2017-10-26 DIAGNOSIS — Z5111 Encounter for antineoplastic chemotherapy: Secondary | ICD-10-CM | POA: Diagnosis not present

## 2017-10-26 DIAGNOSIS — D7589 Other specified diseases of blood and blood-forming organs: Secondary | ICD-10-CM

## 2017-10-26 DIAGNOSIS — Z95828 Presence of other vascular implants and grafts: Secondary | ICD-10-CM

## 2017-10-26 DIAGNOSIS — E538 Deficiency of other specified B group vitamins: Secondary | ICD-10-CM

## 2017-10-26 LAB — COMPREHENSIVE METABOLIC PANEL
ALBUMIN: 4 g/dL (ref 3.5–5.0)
ALK PHOS: 112 U/L (ref 38–126)
ALT: 70 U/L — ABNORMAL HIGH (ref 14–54)
AST: 54 U/L — AB (ref 15–41)
Anion gap: 7 (ref 5–15)
BILIRUBIN TOTAL: 0.4 mg/dL (ref 0.3–1.2)
BUN: 7 mg/dL (ref 6–20)
CALCIUM: 9.4 mg/dL (ref 8.9–10.3)
CO2: 24 mmol/L (ref 22–32)
Chloride: 106 mmol/L (ref 101–111)
Creatinine, Ser: 0.51 mg/dL (ref 0.44–1.00)
GFR calc Af Amer: >60 mL/min (ref >60)
GFR calc non Af Amer: >60 mL/min (ref >60)
GLUCOSE: 150 mg/dL — AB (ref 65–99)
Potassium: 4 mmol/L (ref 3.5–5.1)
Sodium: 137 mmol/L (ref 135–145)
TOTAL PROTEIN: 7.2 g/dL (ref 6.5–8.1)

## 2017-10-26 LAB — CBC WITH DIFFERENTIAL/PLATELET
BASOS ABS: 0 10*3/uL (ref 0–0.1)
BASOS PCT: 0 %
EOS PCT: 5 %
Eosinophils Absolute: 0.2 10*3/uL (ref 0–0.7)
HCT: 32 % — ABNORMAL LOW (ref 35.0–47.0)
Hemoglobin: 11.4 g/dL — ABNORMAL LOW (ref 12.0–16.0)
LYMPHS PCT: 22 %
Lymphs Abs: 0.9 10*3/uL — ABNORMAL LOW (ref 1.0–3.6)
MCH: 36.4 pg — ABNORMAL HIGH (ref 26.0–34.0)
MCHC: 35.6 g/dL (ref 32.0–36.0)
MCV: 102.2 fL — AB (ref 80.0–100.0)
MONO ABS: 0.3 10*3/uL (ref 0.2–0.9)
MONOS PCT: 9 %
Neutro Abs: 2.4 10*3/uL (ref 1.4–6.5)
Neutrophils Relative %: 64 %
PLATELETS: 297 10*3/uL (ref 150–440)
RBC: 3.13 MIL/uL — ABNORMAL LOW (ref 3.80–5.20)
RDW: 12.6 % (ref 11.5–14.5)
WBC: 3.8 10*3/uL (ref 3.6–11.0)

## 2017-10-26 MED ORDER — FAMOTIDINE IN NACL 20-0.9 MG/50ML-% IV SOLN
20.0000 mg | Freq: Once | INTRAVENOUS | Status: AC
  Administered 2017-10-26: 20 mg via INTRAVENOUS
  Filled 2017-10-26: qty 50

## 2017-10-26 MED ORDER — SODIUM CHLORIDE 0.9 % IV SOLN
190.0000 mg | Freq: Once | INTRAVENOUS | Status: AC
  Administered 2017-10-26: 190 mg via INTRAVENOUS
  Filled 2017-10-26: qty 19

## 2017-10-26 MED ORDER — SODIUM CHLORIDE 0.9 % IV SOLN
Freq: Once | INTRAVENOUS | Status: AC
Start: 2017-10-26 — End: 2017-10-26
  Administered 2017-10-26: 12:00:00 via INTRAVENOUS
  Filled 2017-10-26: qty 1000

## 2017-10-26 MED ORDER — SODIUM CHLORIDE 0.9 % IV SOLN
20.0000 mg | Freq: Once | INTRAVENOUS | Status: AC
  Administered 2017-10-26: 20 mg via INTRAVENOUS
  Filled 2017-10-26: qty 2

## 2017-10-26 MED ORDER — CYANOCOBALAMIN 1000 MCG/ML IJ SOLN
1000.0000 ug | Freq: Once | INTRAMUSCULAR | Status: AC
  Administered 2017-10-26: 1000 ug via INTRAMUSCULAR
  Filled 2017-10-26: qty 1

## 2017-10-26 MED ORDER — SODIUM CHLORIDE 0.9% FLUSH
10.0000 mL | Freq: Once | INTRAVENOUS | Status: AC
  Administered 2017-10-26: 10 mL via INTRAVENOUS
  Filled 2017-10-26: qty 10

## 2017-10-26 MED ORDER — DIPHENHYDRAMINE HCL 50 MG/ML IJ SOLN
50.0000 mg | Freq: Once | INTRAMUSCULAR | Status: AC
  Administered 2017-10-26: 50 mg via INTRAVENOUS
  Filled 2017-10-26: qty 1

## 2017-10-26 MED ORDER — HEPARIN SOD (PORK) LOCK FLUSH 100 UNIT/ML IV SOLN
500.0000 [IU] | Freq: Once | INTRAVENOUS | Status: AC
  Administered 2017-10-26: 500 [IU] via INTRAVENOUS
  Filled 2017-10-26: qty 5

## 2017-10-26 MED ORDER — SODIUM CHLORIDE 0.9 % IV SOLN
45.0000 mg/m2 | Freq: Once | INTRAVENOUS | Status: AC
  Administered 2017-10-26: 84 mg via INTRAVENOUS
  Filled 2017-10-26: qty 14

## 2017-10-26 MED ORDER — PALONOSETRON HCL INJECTION 0.25 MG/5ML
0.2500 mg | Freq: Once | INTRAVENOUS | Status: AC
  Administered 2017-10-26: 0.25 mg via INTRAVENOUS
  Filled 2017-10-26: qty 5

## 2017-10-27 ENCOUNTER — Ambulatory Visit
Admission: RE | Admit: 2017-10-27 | Discharge: 2017-10-27 | Disposition: A | Discharge status: Home / Self Care | Payer: Medicare HMO | Source: Ambulatory Visit | Attending: Radiation Oncology | Admitting: Radiation Oncology

## 2017-10-27 DIAGNOSIS — Z510 Encounter for antineoplastic radiation therapy: Secondary | ICD-10-CM | POA: Diagnosis not present

## 2017-10-28 ENCOUNTER — Ambulatory Visit
Admission: RE | Admit: 2017-10-28 | Discharge: 2017-10-28 | Disposition: A | Discharge status: Home / Self Care | Payer: Medicare HMO | Source: Ambulatory Visit | Attending: Radiation Oncology | Admitting: Radiation Oncology

## 2017-10-28 DIAGNOSIS — Z510 Encounter for antineoplastic radiation therapy: Secondary | ICD-10-CM | POA: Diagnosis not present

## 2017-10-29 ENCOUNTER — Ambulatory Visit
Admission: RE | Admit: 2017-10-29 | Discharge: 2017-10-29 | Disposition: A | Discharge status: Home / Self Care | Payer: Medicare HMO | Source: Ambulatory Visit | Attending: Radiation Oncology | Admitting: Radiation Oncology

## 2017-10-29 DIAGNOSIS — Z510 Encounter for antineoplastic radiation therapy: Secondary | ICD-10-CM | POA: Diagnosis not present

## 2017-11-01 ENCOUNTER — Ambulatory Visit
Admission: RE | Admit: 2017-11-01 | Discharge: 2017-11-01 | Disposition: A | Discharge status: Home / Self Care | Payer: Medicare HMO | Source: Ambulatory Visit | Attending: Radiation Oncology | Admitting: Radiation Oncology

## 2017-11-01 DIAGNOSIS — Z510 Encounter for antineoplastic radiation therapy: Secondary | ICD-10-CM | POA: Insufficient documentation

## 2017-11-01 DIAGNOSIS — I671 Cerebral aneurysm, nonruptured: Secondary | ICD-10-CM | POA: Insufficient documentation

## 2017-11-01 DIAGNOSIS — C3491 Malignant neoplasm of unspecified part of right bronchus or lung: Secondary | ICD-10-CM | POA: Insufficient documentation

## 2017-11-01 DIAGNOSIS — Z5111 Encounter for antineoplastic chemotherapy: Secondary | ICD-10-CM | POA: Diagnosis not present

## 2017-11-02 ENCOUNTER — Other Ambulatory Visit: Payer: Self-pay

## 2017-11-02 ENCOUNTER — Inpatient Hospital Stay: Payer: Medicare HMO

## 2017-11-02 ENCOUNTER — Inpatient Hospital Stay: Payer: Medicare HMO | Attending: Oncology

## 2017-11-02 ENCOUNTER — Ambulatory Visit
Admission: RE | Admit: 2017-11-02 | Discharge: 2017-11-02 | Disposition: A | Discharge status: Home / Self Care | Payer: Medicare HMO | Source: Ambulatory Visit | Attending: Radiation Oncology | Admitting: Radiation Oncology

## 2017-11-02 ENCOUNTER — Telehealth: Payer: Self-pay | Admitting: *Deleted

## 2017-11-02 ENCOUNTER — Inpatient Hospital Stay (HOSPITAL_BASED_OUTPATIENT_CLINIC_OR_DEPARTMENT_OTHER): Payer: Medicare HMO | Admitting: Oncology

## 2017-11-02 ENCOUNTER — Encounter: Payer: Self-pay | Admitting: Oncology

## 2017-11-02 VITALS — BP 126/78 | HR 79 | Temp 99.4°F

## 2017-11-02 VITALS — BP 145/79 | HR 97 | Temp 98.2°F | Resp 18 | Wt 184.4 lb

## 2017-11-02 DIAGNOSIS — R5383 Other fatigue: Secondary | ICD-10-CM | POA: Diagnosis not present

## 2017-11-02 DIAGNOSIS — D7589 Other specified diseases of blood and blood-forming organs: Secondary | ICD-10-CM

## 2017-11-02 DIAGNOSIS — C3491 Malignant neoplasm of unspecified part of right bronchus or lung: Secondary | ICD-10-CM | POA: Insufficient documentation

## 2017-11-02 DIAGNOSIS — Z79899 Other long term (current) drug therapy: Secondary | ICD-10-CM | POA: Insufficient documentation

## 2017-11-02 DIAGNOSIS — M1710 Unilateral primary osteoarthritis, unspecified knee: Secondary | ICD-10-CM | POA: Insufficient documentation

## 2017-11-02 DIAGNOSIS — R7401 Elevation of levels of liver transaminase levels: Secondary | ICD-10-CM

## 2017-11-02 DIAGNOSIS — Z5111 Encounter for antineoplastic chemotherapy: Secondary | ICD-10-CM

## 2017-11-02 DIAGNOSIS — R740 Nonspecific elevation of levels of transaminase and lactic acid dehydrogenase [LDH]: Secondary | ICD-10-CM

## 2017-11-02 DIAGNOSIS — M179 Osteoarthritis of knee, unspecified: Secondary | ICD-10-CM | POA: Insufficient documentation

## 2017-11-02 DIAGNOSIS — E538 Deficiency of other specified B group vitamins: Secondary | ICD-10-CM

## 2017-11-02 DIAGNOSIS — R110 Nausea: Secondary | ICD-10-CM | POA: Diagnosis not present

## 2017-11-02 DIAGNOSIS — Z720 Tobacco use: Secondary | ICD-10-CM | POA: Diagnosis not present

## 2017-11-02 LAB — CBC WITH DIFFERENTIAL/PLATELET
Basophils Absolute: 0 10*3/uL (ref 0–0.1)
Basophils Relative: 1 %
EOS ABS: 0.1 10*3/uL (ref 0–0.7)
EOS PCT: 2 %
HCT: 33.1 % — ABNORMAL LOW (ref 35.0–47.0)
Hemoglobin: 11.5 g/dL — ABNORMAL LOW (ref 12.0–16.0)
LYMPHS PCT: 11 %
Lymphs Abs: 0.7 10*3/uL — ABNORMAL LOW (ref 1.0–3.6)
MCH: 36.1 pg — AB (ref 26.0–34.0)
MCHC: 34.9 g/dL (ref 32.0–36.0)
MCV: 103.3 fL — AB (ref 80.0–100.0)
MONO ABS: 0.4 10*3/uL (ref 0.2–0.9)
MONOS PCT: 7 %
Neutro Abs: 4.8 10*3/uL (ref 1.4–6.5)
Neutrophils Relative %: 79 %
PLATELETS: 318 10*3/uL (ref 150–440)
RBC: 3.2 MIL/uL — ABNORMAL LOW (ref 3.80–5.20)
RDW: 12.9 % (ref 11.5–14.5)
WBC: 6 10*3/uL (ref 3.6–11.0)

## 2017-11-02 LAB — COMPREHENSIVE METABOLIC PANEL
ALBUMIN: 3.8 g/dL (ref 3.5–5.0)
ALT: 21 U/L (ref 14–54)
AST: 27 U/L (ref 15–41)
Alkaline Phosphatase: 86 U/L (ref 38–126)
Anion gap: 10 (ref 5–15)
BUN: 9 mg/dL (ref 6–20)
CHLORIDE: 101 mmol/L (ref 101–111)
CO2: 23 mmol/L (ref 22–32)
CREATININE: 0.64 mg/dL (ref 0.44–1.00)
Calcium: 9 mg/dL (ref 8.9–10.3)
GFR calc Af Amer: >60 mL/min (ref >60)
GFR calc non Af Amer: >60 mL/min (ref >60)
GLUCOSE: 212 mg/dL — AB (ref 65–99)
POTASSIUM: 3.7 mmol/L (ref 3.5–5.1)
Sodium: 134 mmol/L — ABNORMAL LOW (ref 135–145)
Total Bilirubin: 0.5 mg/dL (ref 0.3–1.2)
Total Protein: 7 g/dL (ref 6.5–8.1)

## 2017-11-02 MED ORDER — FAMOTIDINE IN NACL 20-0.9 MG/50ML-% IV SOLN
20.0000 mg | Freq: Once | INTRAVENOUS | Status: AC
  Administered 2017-11-02: 20 mg via INTRAVENOUS
  Filled 2017-11-02: qty 50

## 2017-11-02 MED ORDER — B-12 1000 MCG PO CAPS: 1000.0000 ug | ORAL_CAPSULE | Freq: Every day | ORAL | 3 refills | Status: AC

## 2017-11-02 MED ORDER — HEPARIN SOD (PORK) LOCK FLUSH 100 UNIT/ML IV SOLN
500.0000 [IU] | Freq: Once | INTRAVENOUS | Status: AC
  Administered 2017-11-02: 500 [IU] via INTRAVENOUS
  Filled 2017-11-02: qty 5

## 2017-11-02 MED ORDER — CYANOCOBALAMIN 1000 MCG/ML IJ SOLN
1000.0000 ug | Freq: Once | INTRAMUSCULAR | Status: AC
Start: 2017-11-02 — End: 2017-11-02
  Administered 2017-11-02: 1000 ug via INTRAMUSCULAR
  Filled 2017-11-02: qty 1

## 2017-11-02 MED ORDER — PALONOSETRON HCL INJECTION 0.25 MG/5ML
0.2500 mg | Freq: Once | INTRAVENOUS | Status: AC
  Administered 2017-11-02: 0.25 mg via INTRAVENOUS
  Filled 2017-11-02: qty 5

## 2017-11-02 MED ORDER — SODIUM CHLORIDE 0.9 % IV SOLN
Freq: Once | INTRAVENOUS | Status: AC
  Administered 2017-11-02: 13:00:00 via INTRAVENOUS
  Filled 2017-11-02: qty 1000

## 2017-11-02 MED ORDER — DIPHENHYDRAMINE HCL 50 MG/ML IJ SOLN
50.0000 mg | Freq: Once | INTRAMUSCULAR | Status: AC
  Administered 2017-11-02: 50 mg via INTRAVENOUS
  Filled 2017-11-02: qty 1

## 2017-11-02 MED ORDER — SODIUM CHLORIDE 0.9 % IV SOLN
20.0000 mg | Freq: Once | INTRAVENOUS | Status: AC
  Administered 2017-11-02: 20 mg via INTRAVENOUS
  Filled 2017-11-02: qty 2

## 2017-11-02 MED ORDER — SODIUM CHLORIDE 0.9% FLUSH
10.0000 mL | Freq: Once | INTRAVENOUS | Status: AC
  Administered 2017-11-02: 10 mL via INTRAVENOUS
  Filled 2017-11-02: qty 10

## 2017-11-02 MED ORDER — SODIUM CHLORIDE 0.9 % IV SOLN
194.4000 mg | Freq: Once | INTRAVENOUS | Status: AC
  Administered 2017-11-02: 190 mg via INTRAVENOUS
  Filled 2017-11-02: qty 19

## 2017-11-02 MED ORDER — SODIUM CHLORIDE 0.9 % IV SOLN
45.0000 mg/m2 | Freq: Once | INTRAVENOUS | Status: AC
  Administered 2017-11-02: 84 mg via INTRAVENOUS
  Filled 2017-11-02: qty 14

## 2017-11-02 NOTE — Progress Notes (Signed)
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   HISTORY OF PRESENTING ILLNESS:  Connie Powers is a  67 y.o.  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  CT lung cancer screen 05/27/2017   1. A few bilateral pulmonary nodules measuring up to 5 mm are indeterminate. Considering the suspicious 2.0 cm mediastinal adenopathy, follow up with PET/CT is recommended. 2. Mild bronchial wall thickening could represent airway disease including bronchitis.ACR Lung-RADS Category and Recommendation*: ACR Lung-RADS Category 2S (S modifier for mediastinal adenopathy)  PET scan 3/112019 1. No hypermetabolic pulmonary nodules.2. Hypermetabolic enlarged right paratracheal lymph node, of uncertain etiology in isolation. Lymphoproliferative disorder cannot be excluded. 3.  Aortic atherosclerosis (ICD10-170.0).  EBUS biopsy of paratracheal node showed non small cell lung cancer favoring adenocarcinoma. Case was discussed on tumor board on 09/16/2017 and consensus recommendation is to treat as Stage IIIA disease given the invasion of mediastinum.   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 MRI brain is contraindicated. CT brain negative for metastatic disease.  INTERVAL HISTORY Connie Powers is a 67 y.o. 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  She is status post 3 cycles of  carboplatin and Taxol concurrent radiation.   She reports feel well at baseline. Nausea is mild, manageble, improved with antiemetics.  She has mild throat discomfort which  gets better with clearing throat.  Reports feeling fatigue, get worse at the end of the day, get better after resting.  She has been working on smoke cessation, only smoke 3-4 cigarettes a day.    Review of Systems  Constitutional: Negative for chills, fever, malaise/fatigue and weight loss.  HENT: Negative for congestion, ear discharge, ear pain, hearing loss, nosebleeds, sinus pain and sore throat.   Eyes: Negative for double vision, photophobia, pain, discharge and redness.  Respiratory: Negative for cough, hemoptysis, sputum production, shortness of breath and wheezing.   Cardiovascular: Negative for chest pain, palpitations, orthopnea, claudication and leg swelling.  Gastrointestinal: Negative for abdominal pain, blood in stool, constipation, diarrhea, heartburn, melena, nausea and vomiting.  Genitourinary: Negative for dysuria, flank pain, frequency and hematuria.  Musculoskeletal: Negative for back pain, myalgias and neck pain.  Skin: Negative for itching and rash.  Neurological: Negative for dizziness, tingling, tremors, sensory change, focal weakness, weakness and headaches.  Endo/Heme/Allergies: Negative for environmental allergies. Does not bruise/bleed easily.  Psychiatric/Behavioral: Negative for depression, hallucinations and substance abuse. The patient is not nervous/anxious.     MEDICAL HISTORY:  Past Medical History:  Diagnosis Date  . Brain aneurysm   . Difficult intubation   . Non-small cell cancer of right lung ([place]) 09/22/2017  . Viral meningitis     SURGICAL HISTORY: Past Surgical History:  Procedure Laterality Date  . ENDOBRONCHIAL ULTRASOUND N/A 09/09/2017   Procedure: ENDOBRONCHIAL ULTRASOUND;  Surgeon: Laverle Hobby, MD;  Location: [facility] ORS;  Service: Pulmonary;  Laterality: N/A;  . OOPHORECTOMY Left   . PORTA CATH INSERTION N/A 09/29/2017   Procedure: PORTA CATH INSERTION;  Surgeon: Algernon Huxley, MD;  Location: [place] [facility];  Service:  Cardiovascular;  Laterality: N/A;  . surgical repair for brain tumor  1969   clips in head  . WISDOM TOOTH EXTRACTION      SOCIAL HISTORY: Social History   Socioeconomic History  . Marital status: Married    Spouse name: Not on file  . Number of children: Not on file  . Years of education: Not on file  . Highest education level: Not on file  Occupational History  . Not on file  Social Needs  . Financial resource strain: Not on file  . Food insecurity:    Worry: Not on file    Inability: Not on file  . Transportation needs:    Medical: Not on file    Non-medical: Not on file  Tobacco Use  . Smoking status: Current Every Day Smoker    Packs/day: 0.25  . Smokeless tobacco: Never Used  . Tobacco comment: currently at 4 cigarettes a day  Substance and Sexual Activity  . Alcohol use: Yes    Comment: social  . Drug use: Yes    Types: Cocaine  . Sexual activity: Not on file  Lifestyle  . Physical activity:    Days per week: Not on file    Minutes per session: Not on file  . Stress: Not on file  Relationships  . Social connections:    Talks on phone: Not on file    Gets together: Not on file    Attends religious service: Not on file    Active member of club or organization: Not on file    Attends meetings of clubs or organizations: Not on file    Relationship status: Not on file  . Intimate partner violence:    Fear of current or ex partner: Not on file    Emotionally abused: Not on file    Physically abused: Not on file    Forced sexual activity: Not on file  Other Topics Concern  . Not on file  Social History Narrative  . Not on file    FAMILY HISTORY: Family History  Problem Relation Age of Onset  . Diabetes Mother   . Hypertension Mother   . Hyperlipidemia Mother   . Arthritis Mother   . Hypertension Father   . Hyperlipidemia Father   . Heart attack Father        x2  . Breast cancer Neg Hx     ALLERGIES:  has No Known Allergies.  MEDICATIONS:    Current Outpatient Medications  Medication Sig Dispense Refill  . aspirin EC 81 MG tablet Take 81 mg by mouth daily.    . Cyanocobalamin (B-12) 1000 MCG CAPS Take 1,000 mcg by mouth daily. 30 capsule 3  . lidocaine-prilocaine (EMLA) cream Apply to affected area once 30 g 3  . ondansetron (ZOFRAN) 8 MG tablet Take 1 tablet (8 mg total) by mouth 2 (two) times daily as needed for refractory nausea / vomiting. Start on day 3 after chemo. 30 tablet 1  . prochlorperazine (COMPAZINE) 10 MG tablet Take 1 tablet (10 mg total) by mouth every 6 (six) hours as needed (Nausea or vomiting). 30 tablet 1  . umeclidinium-vilanterol (ANORO ELLIPTA) 62.5-25 MCG/INH AEPB Inhale 1 puff into the lungs daily as needed (shortness of breath).      No current facility-administered medications for this visit.      PHYSICAL EXAMINATION: ECOG PERFORMANCE STATUS: 0 - Asymptomatic Vitals:   11/02/17 1618  BP: (!) 145/79  Pulse: 97  Resp: 18  Temp: 98.2  F (36.8 C)   Filed Weights   11/02/17 1618  Weight: 184 lb 6.6 oz (83.7 kg)    Physical Exam  Constitutional: She is oriented to person, place, and time. She appears well-developed and well-nourished. No distress.  HENT:  Head: Normocephalic and atraumatic.  Right Ear: External ear normal.  Left Ear: External ear normal.  Mouth/Throat: Oropharynx is clear and moist.  Eyes: Pupils are equal, round, and reactive to light. Conjunctivae and EOM are normal. Left eye exhibits no discharge. No scleral icterus.  Neck: Normal range of motion. Neck supple.  Cardiovascular: Normal rate, regular rhythm and normal heart sounds.  No murmur heard. Pulmonary/Chest: Effort normal and breath sounds normal. No respiratory distress. She has no wheezes. She has no rales. She exhibits no tenderness.  Abdominal: Soft. Bowel sounds are normal. She exhibits no distension and no mass. There is no tenderness. There is no guarding.  Musculoskeletal: Normal range of motion. She  exhibits no edema or deformity.  Lymphadenopathy:    She has no cervical adenopathy.  Neurological: She is alert and oriented to person, place, and time. No cranial nerve deficit. Coordination normal.  Skin: Skin is warm and dry. No rash noted.  Psychiatric: She has a normal mood and affect. Her behavior is normal. Thought content normal.     LABORATORY DATA:  I have reviewed the data as listed Lab Results  Component Value Date   WBC 6.0 11/02/2017   HGB 11.5 (L) 11/02/2017   HCT 33.1 (L) 11/02/2017   MCV 103.3 (H) 11/02/2017   PLT 318 11/02/2017   Recent Labs    10/18/17 0922 10/26/17 1050 11/02/17 1039  NA 136 137 134*  K 4.3 4.0 3.7  CL 103 106 101  CO2 24 24 23   GLUCOSE 158* 150* 212*  BUN 9 7 9   CREATININE 0.56 0.51 0.64  CALCIUM 9.1 9.4 9.0  GFRNONAA >60 >60 >60  GFRAA >60 >60 >60  PROT 7.1 7.2 7.0  ALBUMIN 3.9 4.0 3.8  AST 76* 54* 27  ALT 83* 70* 21  ALKPHOS 103 112 86  BILITOT 0.6 0.4 0.5       ASSESSMENT & PLAN:  Cancer Staging Non-small cell cancer of right lung (HCC) Staging form: Lung, AJCC 8th Edition - Clinical stage from 09/22/2017: Stage Unknown (cTX, cN1, cM0) - Signed by Earlie Server, MD on 09/22/2017 - Pathologic: No stage assigned - Unsigned  1. Non-small cell cancer of right lung ([place])   2. Macrocytosis without anemia   3. Low serum vitamin B12   4. Encounter for antineoplastic chemotherapy   5. Transaminitis   6. Nausea without vomiting    # Lung cancer, stabl on concurrent chemotherapy treatments. Tolerates chemotherapy well with mild side effects.  Labs reviewed with patient. Counts are acceptable to continue treatment.  Proceed with cycle 4 Carboplatin and Taxol with concurrent Radiation.   # Macrocytosis with anemi with borderline B12 levels. Start Vitamin B12 oral supplementation. Rx sent to pharmacy.  # Mild transaminitis, due to chemotherapy, resolved.  # Nausea, mild, relieved by antiemetics.   All questions were answered. The  patient knows to call the clinic with any problems questions or concerns.  Return of visit: 1 week for cycle 5 carbo and taxol, 2 week to 25 min. >50% of the time was  spent in counseling and coordination of care.  Earlie Server, MD, PhD Hematology Oncology [facility] Pager- [phone] 11/02/2017

## 2017-11-02 NOTE — Telephone Encounter (Signed)
Prior authorization required for Emla cream.   Prior auth completed via CoverMyMeds   Authorization approved from 05/30/17 to 02/01/18.

## 2017-11-02 NOTE — Progress Notes (Signed)
Patient here today for follow up and chemotherapy.

## 2017-11-03 ENCOUNTER — Ambulatory Visit
Admission: RE | Admit: 2017-11-03 | Discharge: 2017-11-03 | Disposition: A | Discharge status: Home / Self Care | Payer: Medicare HMO | Source: Ambulatory Visit | Attending: Radiation Oncology | Admitting: Radiation Oncology

## 2017-11-03 DIAGNOSIS — Z5111 Encounter for antineoplastic chemotherapy: Secondary | ICD-10-CM | POA: Diagnosis not present

## 2017-11-04 ENCOUNTER — Ambulatory Visit
Admission: RE | Admit: 2017-11-04 | Discharge: 2017-11-04 | Disposition: A | Discharge status: Home / Self Care | Payer: Medicare HMO | Source: Ambulatory Visit | Attending: Radiation Oncology | Admitting: Radiation Oncology

## 2017-11-04 DIAGNOSIS — Z5111 Encounter for antineoplastic chemotherapy: Secondary | ICD-10-CM | POA: Diagnosis not present

## 2017-11-05 ENCOUNTER — Ambulatory Visit
Admission: RE | Admit: 2017-11-05 | Discharge: 2017-11-05 | Disposition: A | Discharge status: Home / Self Care | Payer: Medicare HMO | Source: Ambulatory Visit | Attending: Radiation Oncology | Admitting: Radiation Oncology

## 2017-11-05 DIAGNOSIS — Z5111 Encounter for antineoplastic chemotherapy: Secondary | ICD-10-CM | POA: Diagnosis not present

## 2017-11-08 ENCOUNTER — Ambulatory Visit
Admission: RE | Admit: 2017-11-08 | Discharge: 2017-11-08 | Disposition: A | Discharge status: Home / Self Care | Payer: Medicare HMO | Source: Ambulatory Visit | Attending: Radiation Oncology | Admitting: Radiation Oncology

## 2017-11-08 DIAGNOSIS — Z5111 Encounter for antineoplastic chemotherapy: Secondary | ICD-10-CM | POA: Diagnosis not present

## 2017-11-09 ENCOUNTER — Inpatient Hospital Stay: Payer: Medicare HMO

## 2017-11-09 ENCOUNTER — Ambulatory Visit
Admission: RE | Admit: 2017-11-09 | Discharge: 2017-11-09 | Disposition: A | Discharge status: Home / Self Care | Payer: Medicare HMO | Source: Ambulatory Visit | Attending: Radiation Oncology | Admitting: Radiation Oncology

## 2017-11-09 VITALS — BP 150/77 | HR 98 | Temp 95.2°F | Resp 18 | Wt 184.5 lb

## 2017-11-09 DIAGNOSIS — C3491 Malignant neoplasm of unspecified part of right bronchus or lung: Secondary | ICD-10-CM

## 2017-11-09 DIAGNOSIS — Z5111 Encounter for antineoplastic chemotherapy: Secondary | ICD-10-CM | POA: Diagnosis not present

## 2017-11-09 LAB — COMPREHENSIVE METABOLIC PANEL
ALK PHOS: 98 U/L (ref 38–126)
ALT: 16 U/L (ref 14–54)
AST: 21 U/L (ref 15–41)
Albumin: 3.7 g/dL (ref 3.5–5.0)
Anion gap: 8 (ref 5–15)
BUN: 8 mg/dL (ref 6–20)
CALCIUM: 8.9 mg/dL (ref 8.9–10.3)
CO2: 23 mmol/L (ref 22–32)
CREATININE: 0.59 mg/dL (ref 0.44–1.00)
Chloride: 103 mmol/L (ref 101–111)
Glucose, Bld: 160 mg/dL — ABNORMAL HIGH (ref 65–99)
Potassium: 4 mmol/L (ref 3.5–5.1)
SODIUM: 134 mmol/L — AB (ref 135–145)
TOTAL PROTEIN: 7.2 g/dL (ref 6.5–8.1)
Total Bilirubin: 0.7 mg/dL (ref 0.3–1.2)

## 2017-11-09 LAB — CBC WITH DIFFERENTIAL/PLATELET
BASOS ABS: 0.1 10*3/uL (ref 0–0.1)
BASOS PCT: 1 %
EOS ABS: 0.1 10*3/uL (ref 0–0.7)
Eosinophils Relative: 2 %
HEMATOCRIT: 31.1 % — AB (ref 35.0–47.0)
HEMOGLOBIN: 11 g/dL — AB (ref 12.0–16.0)
Lymphocytes Relative: 11 %
Lymphs Abs: 0.5 10*3/uL — ABNORMAL LOW (ref 1.0–3.6)
MCH: 36.1 pg — ABNORMAL HIGH (ref 26.0–34.0)
MCHC: 35.3 g/dL (ref 32.0–36.0)
MCV: 102.2 fL — ABNORMAL HIGH (ref 80.0–100.0)
Monocytes Absolute: 0.6 10*3/uL (ref 0.2–0.9)
Monocytes Relative: 11 %
NEUTROS ABS: 3.8 10*3/uL (ref 1.4–6.5)
Neutrophils Relative %: 75 %
Platelets: 273 10*3/uL (ref 150–440)
RBC: 3.04 MIL/uL — ABNORMAL LOW (ref 3.80–5.20)
RDW: 12.8 % (ref 11.5–14.5)
WBC: 5.1 10*3/uL (ref 3.6–11.0)

## 2017-11-09 MED ORDER — DIPHENHYDRAMINE HCL 50 MG/ML IJ SOLN
50.0000 mg | Freq: Once | INTRAMUSCULAR | Status: AC
  Administered 2017-11-09: 50 mg via INTRAVENOUS
  Filled 2017-11-09: qty 1

## 2017-11-09 MED ORDER — SODIUM CHLORIDE 0.9% FLUSH
10.0000 mL | Freq: Once | INTRAVENOUS | Status: AC
  Administered 2017-11-09: 10 mL via INTRAVENOUS
  Filled 2017-11-09: qty 10

## 2017-11-09 MED ORDER — SODIUM CHLORIDE 0.9 % IV SOLN
20.0000 mg | Freq: Once | INTRAVENOUS | Status: AC
  Administered 2017-11-09: 20 mg via INTRAVENOUS
  Filled 2017-11-09: qty 2

## 2017-11-09 MED ORDER — PALONOSETRON HCL INJECTION 0.25 MG/5ML
0.2500 mg | Freq: Once | INTRAVENOUS | Status: AC
  Administered 2017-11-09: 0.25 mg via INTRAVENOUS
  Filled 2017-11-09: qty 5

## 2017-11-09 MED ORDER — SODIUM CHLORIDE 0.9 % IV SOLN
Freq: Once | INTRAVENOUS | Status: AC
  Administered 2017-11-09: 11:00:00 via INTRAVENOUS
  Filled 2017-11-09: qty 1000

## 2017-11-09 MED ORDER — HEPARIN SOD (PORK) LOCK FLUSH 100 UNIT/ML IV SOLN
500.0000 [IU] | Freq: Once | INTRAVENOUS | Status: AC
  Administered 2017-11-09: 500 [IU] via INTRAVENOUS
  Filled 2017-11-09: qty 5

## 2017-11-09 MED ORDER — FAMOTIDINE IN NACL 20-0.9 MG/50ML-% IV SOLN
20.0000 mg | Freq: Once | INTRAVENOUS | Status: AC
  Administered 2017-11-09: 20 mg via INTRAVENOUS
  Filled 2017-11-09: qty 50

## 2017-11-09 MED ORDER — SODIUM CHLORIDE 0.9 % IV SOLN
194.4000 mg | Freq: Once | INTRAVENOUS | Status: AC
  Administered 2017-11-09: 190 mg via INTRAVENOUS
  Filled 2017-11-09: qty 19

## 2017-11-09 MED ORDER — HEPARIN SOD (PORK) LOCK FLUSH 100 UNIT/ML IV SOLN: 500.0000 [IU] | Freq: Once | INTRAVENOUS | Status: DC | PRN

## 2017-11-09 MED ORDER — PACLITAXEL CHEMO INJECTION 300 MG/50ML
45.0000 mg/m2 | Freq: Once | INTRAVENOUS | Status: AC
  Administered 2017-11-09: 84 mg via INTRAVENOUS
  Filled 2017-11-09: qty 14

## 2017-11-09 MED ORDER — SODIUM CHLORIDE 0.9% FLUSH
10.0000 mL | INTRAVENOUS | Status: DC | PRN
  Filled 2017-11-09: qty 10

## 2017-11-09 NOTE — Progress Notes (Signed)
Per Almyra Free CMA per Dr. Tasia Catchings okay to proceed with Sodium of 134 and no B12 injection.

## 2017-11-10 ENCOUNTER — Ambulatory Visit
Admission: RE | Admit: 2017-11-10 | Discharge: 2017-11-10 | Disposition: A | Discharge status: Home / Self Care | Payer: Medicare HMO | Source: Ambulatory Visit | Attending: Radiation Oncology | Admitting: Radiation Oncology

## 2017-11-10 DIAGNOSIS — Z5111 Encounter for antineoplastic chemotherapy: Secondary | ICD-10-CM | POA: Diagnosis not present

## 2017-11-11 ENCOUNTER — Ambulatory Visit
Admission: RE | Admit: 2017-11-11 | Discharge: 2017-11-11 | Disposition: A | Discharge status: Home / Self Care | Payer: Medicare HMO | Source: Ambulatory Visit | Attending: Radiation Oncology | Admitting: Radiation Oncology

## 2017-11-11 DIAGNOSIS — Z5111 Encounter for antineoplastic chemotherapy: Secondary | ICD-10-CM | POA: Diagnosis not present

## 2017-11-12 ENCOUNTER — Ambulatory Visit
Admission: RE | Admit: 2017-11-12 | Discharge: 2017-11-12 | Disposition: A | Discharge status: Home / Self Care | Payer: Medicare HMO | Source: Ambulatory Visit | Attending: Radiation Oncology | Admitting: Radiation Oncology

## 2017-11-12 DIAGNOSIS — Z5111 Encounter for antineoplastic chemotherapy: Secondary | ICD-10-CM | POA: Diagnosis not present

## 2017-11-15 ENCOUNTER — Ambulatory Visit
Admission: RE | Admit: 2017-11-15 | Discharge: 2017-11-15 | Disposition: A | Discharge status: Home / Self Care | Payer: Medicare HMO | Source: Ambulatory Visit | Attending: Radiation Oncology | Admitting: Radiation Oncology

## 2017-11-15 DIAGNOSIS — Z5111 Encounter for antineoplastic chemotherapy: Secondary | ICD-10-CM | POA: Diagnosis not present

## 2017-11-16 ENCOUNTER — Encounter: Payer: Self-pay | Admitting: Oncology

## 2017-11-16 ENCOUNTER — Inpatient Hospital Stay (HOSPITAL_BASED_OUTPATIENT_CLINIC_OR_DEPARTMENT_OTHER): Payer: Medicare HMO | Admitting: Oncology

## 2017-11-16 ENCOUNTER — Other Ambulatory Visit: Payer: Self-pay

## 2017-11-16 ENCOUNTER — Inpatient Hospital Stay: Payer: Medicare HMO

## 2017-11-16 ENCOUNTER — Ambulatory Visit
Admission: RE | Admit: 2017-11-16 | Discharge: 2017-11-16 | Disposition: A | Discharge status: Home / Self Care | Payer: Medicare HMO | Source: Ambulatory Visit | Attending: Radiation Oncology | Admitting: Radiation Oncology

## 2017-11-16 VITALS — BP 143/80 | HR 87 | Temp 97.5°F | Resp 18 | Wt 187.9 lb

## 2017-11-16 DIAGNOSIS — C3491 Malignant neoplasm of unspecified part of right bronchus or lung: Secondary | ICD-10-CM

## 2017-11-16 DIAGNOSIS — E538 Deficiency of other specified B group vitamins: Secondary | ICD-10-CM

## 2017-11-16 DIAGNOSIS — Z5111 Encounter for antineoplastic chemotherapy: Secondary | ICD-10-CM

## 2017-11-16 DIAGNOSIS — D531 Other megaloblastic anemias, not elsewhere classified: Secondary | ICD-10-CM | POA: Diagnosis not present

## 2017-11-16 DIAGNOSIS — D7589 Other specified diseases of blood and blood-forming organs: Secondary | ICD-10-CM

## 2017-11-16 LAB — CBC WITH DIFFERENTIAL/PLATELET
BASOS ABS: 0 10*3/uL (ref 0–0.1)
BASOS PCT: 1 %
Eosinophils Absolute: 0.1 10*3/uL (ref 0–0.7)
Eosinophils Relative: 4 %
HEMATOCRIT: 28.4 % — AB (ref 35.0–47.0)
Hemoglobin: 10.1 g/dL — ABNORMAL LOW (ref 12.0–16.0)
Lymphocytes Relative: 16 %
Lymphs Abs: 0.6 10*3/uL — ABNORMAL LOW (ref 1.0–3.6)
MCH: 36.4 pg — ABNORMAL HIGH (ref 26.0–34.0)
MCHC: 35.6 g/dL (ref 32.0–36.0)
MCV: 102 fL — ABNORMAL HIGH (ref 80.0–100.0)
Monocytes Absolute: 0.4 10*3/uL (ref 0.2–0.9)
Monocytes Relative: 9 %
NEUTROS ABS: 2.8 10*3/uL (ref 1.4–6.5)
Neutrophils Relative %: 70 %
PLATELETS: 275 10*3/uL (ref 150–440)
RBC: 2.78 MIL/uL — ABNORMAL LOW (ref 3.80–5.20)
RDW: 13.2 % (ref 11.5–14.5)
WBC: 3.9 10*3/uL (ref 3.6–11.0)

## 2017-11-16 LAB — COMPREHENSIVE METABOLIC PANEL
ALK PHOS: 98 U/L (ref 38–126)
ALT: 23 U/L (ref 14–54)
ANION GAP: 7 (ref 5–15)
AST: 18 U/L (ref 15–41)
Albumin: 3.4 g/dL — ABNORMAL LOW (ref 3.5–5.0)
BILIRUBIN TOTAL: 0.6 mg/dL (ref 0.3–1.2)
BUN: 5 mg/dL — ABNORMAL LOW (ref 6–20)
CALCIUM: 8.8 mg/dL — AB (ref 8.9–10.3)
CO2: 24 mmol/L (ref 22–32)
Chloride: 104 mmol/L (ref 101–111)
Creatinine, Ser: 0.43 mg/dL — ABNORMAL LOW (ref 0.44–1.00)
GFR calc non Af Amer: >60 mL/min (ref >60)
GLUCOSE: 131 mg/dL — AB (ref 65–99)
Potassium: 3.7 mmol/L (ref 3.5–5.1)
Sodium: 135 mmol/L (ref 135–145)
TOTAL PROTEIN: 6.6 g/dL (ref 6.5–8.1)

## 2017-11-16 MED ORDER — SODIUM CHLORIDE 0.9% FLUSH
10.0000 mL | INTRAVENOUS | Status: DC | PRN
  Administered 2017-11-16: 10 mL via INTRAVENOUS
  Filled 2017-11-16: qty 10

## 2017-11-16 MED ORDER — SODIUM CHLORIDE 0.9 % IV SOLN
Freq: Once | INTRAVENOUS | Status: AC
  Administered 2017-11-16: 11:00:00 via INTRAVENOUS
  Filled 2017-11-16: qty 1000

## 2017-11-16 MED ORDER — PALONOSETRON HCL INJECTION 0.25 MG/5ML
0.2500 mg | Freq: Once | INTRAVENOUS | Status: AC
  Administered 2017-11-16: 0.25 mg via INTRAVENOUS
  Filled 2017-11-16: qty 5

## 2017-11-16 MED ORDER — CYANOCOBALAMIN 1000 MCG/ML IJ SOLN
1000.0000 ug | Freq: Once | INTRAMUSCULAR | Status: AC
  Administered 2017-11-16: 1000 ug via INTRAMUSCULAR
  Filled 2017-11-16: qty 1

## 2017-11-16 MED ORDER — SODIUM CHLORIDE 0.9 % IV SOLN
45.0000 mg/m2 | Freq: Once | INTRAVENOUS | Status: AC
  Administered 2017-11-16: 84 mg via INTRAVENOUS
  Filled 2017-11-16: qty 14

## 2017-11-16 MED ORDER — FAMOTIDINE IN NACL 20-0.9 MG/50ML-% IV SOLN
20.0000 mg | Freq: Once | INTRAVENOUS | Status: AC
  Administered 2017-11-16: 20 mg via INTRAVENOUS

## 2017-11-16 MED ORDER — SODIUM CHLORIDE 0.9 % IV SOLN
190.0000 mg | Freq: Once | INTRAVENOUS | Status: AC
  Administered 2017-11-16: 190 mg via INTRAVENOUS
  Filled 2017-11-16: qty 19

## 2017-11-16 MED ORDER — DIPHENHYDRAMINE HCL 50 MG/ML IJ SOLN
50.0000 mg | Freq: Once | INTRAMUSCULAR | Status: AC
  Administered 2017-11-16: 50 mg via INTRAVENOUS
  Filled 2017-11-16: qty 1

## 2017-11-16 MED ORDER — DEXAMETHASONE SODIUM PHOSPHATE 100 MG/10ML IJ SOLN
20.0000 mg | Freq: Once | INTRAMUSCULAR | Status: AC
  Administered 2017-11-16: 20 mg via INTRAVENOUS
  Filled 2017-11-16: qty 2

## 2017-11-16 MED ORDER — HEPARIN SOD (PORK) LOCK FLUSH 100 UNIT/ML IV SOLN
500.0000 [IU] | Freq: Once | INTRAVENOUS | Status: AC | PRN
  Administered 2017-11-16: 500 [IU]

## 2017-11-16 MED ORDER — HEPARIN SOD (PORK) LOCK FLUSH 100 UNIT/ML IV SOLN
500.0000 [IU] | Freq: Once | INTRAVENOUS | Status: DC
  Filled 2017-11-16: qty 5

## 2017-11-16 NOTE — Progress Notes (Signed)
DISCONTINUE ON PATHWAY REGIMEN - Non-Small Cell Lung     Administer weekly:     Paclitaxel      Carboplatin   **Always confirm dose/schedule in your pharmacy ordering system**  REASON: Continuation Of Treatment PRIOR TREATMENT: [id number]: Carboplatin AUC=2 + Paclitaxel 45 mg/m2 Weekly During Radiation TREATMENT RESPONSE: Unable to Evaluate  START ON PATHWAY REGIMEN - Non-Small Cell Lung     A cycle is every 14 days:     Durvalumab   **Always confirm dose/schedule in your pharmacy ordering system**  Patient Characteristics: Stage III - Unresectable, PS = 0, 1 AJCC T Category: TX Current Disease Status: No Distant Mets or Local Recurrence AJCC N Category: N1 AJCC M Category: M0 AJCC 8 Stage Grouping: Unknown Performance Status: PS = 0, 1 Intent of Therapy: Curative Intent, Discussed with Patient

## 2017-11-16 NOTE — Progress Notes (Signed)
Patient here today for follow up.   

## 2017-11-16 NOTE — Progress Notes (Signed)
Hematology/Oncology follow up note 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   HISTORY OF PRESENTING ILLNESS:  Connie Powers is a  67 y.o.  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  CT lung cancer screen 05/27/2017   1. A few bilateral pulmonary nodules measuring up to 5 mm are indeterminate. Considering the suspicious 2.0 cm mediastinal adenopathy, follow up with PET/CT is recommended. 2. Mild bronchial wall thickening could represent airway disease including bronchitis.ACR Lung-RADS Category and Recommendation*: ACR Lung-RADS Category 2S (S modifier for mediastinal adenopathy)  PET scan 3/112019 1. No hypermetabolic pulmonary nodules.2. Hypermetabolic enlarged right paratracheal lymph node, of uncertain etiology in isolation. Lymphoproliferative disorder cannot be excluded. 3.  Aortic atherosclerosis (ICD10-170.0).  EBUS biopsy of paratracheal node showed non small cell lung cancer favoring adenocarcinoma. Case was discussed on tumor board on 09/16/2017 and consensus recommendation is to treat as Stage IIIA disease given the invasion of mediastinum.   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 MRI brain is contraindicated. CT brain negative for metastatic disease.  INTERVAL HISTORY Connie Powers is a 67 y.o. female who has above history reviewed by me today management.  She is status post 5 cycles of carboplatin and radiation.  #Cough, stable, not associated with any hemoptysis, #Fatigue: reports worsening fatigue. Chronic onset, perisistent, no aggravating or improving factors, no associated symptoms.  #Tobacco use: Reports that she  has quit smoking. #Chemotherapy: Reports doing well.  Mild nausea, manageable with antiemetics.  Otherwise complaints..     Review of Systems  Constitutional: Positive for malaise/fatigue. Negative for chills, fever and weight loss.  HENT: Negative for congestion, ear discharge, ear pain, hearing loss, nosebleeds, sinus pain and sore throat.   Eyes: Negative for double vision, photophobia, pain, discharge and redness.  Respiratory: Negative for cough, hemoptysis, sputum production, shortness of breath and wheezing.   Cardiovascular: Negative for chest pain, palpitations, orthopnea, claudication and leg swelling.  Gastrointestinal: Negative for abdominal pain, blood in stool, constipation, diarrhea, heartburn, melena, nausea and vomiting.  Genitourinary: Negative for dysuria, flank pain, frequency and hematuria.  Musculoskeletal: Negative for back pain, myalgias and neck pain.  Skin: Negative for itching and rash.  Neurological: Negative for dizziness, tingling, tremors, sensory change, focal weakness, weakness and headaches.  Endo/Heme/Allergies: Negative for environmental allergies. Does not bruise/bleed easily.  Psychiatric/Behavioral: Negative for depression, hallucinations and substance abuse. The patient is not nervous/anxious.     MEDICAL HISTORY:  Past Medical History:  Diagnosis Date  . Brain aneurysm   . Difficult intubation   . Non-small cell cancer of right lung ([place]) 09/22/2017  . Viral meningitis     SURGICAL HISTORY: Past Surgical History:  Procedure Laterality Date  . ENDOBRONCHIAL ULTRASOUND N/A 09/09/2017   Procedure: ENDOBRONCHIAL ULTRASOUND;  Surgeon: Laverle Hobby, MD;  Location: [facility] ORS;  Service: Pulmonary;  Laterality: N/A;  . OOPHORECTOMY Left   . PORTA CATH INSERTION N/A 09/29/2017   Procedure: PORTA CATH INSERTION;  Surgeon: Algernon Huxley, MD;  Location: [place] [facility];  Service: Cardiovascular;  Laterality: N/A;  . surgical repair for brain  tumor  1969   clips in head  . WISDOM TOOTH EXTRACTION  SOCIAL HISTORY: Social History   Socioeconomic History  . Marital status: Married    Spouse name: Not on file  . Number of children: Not on file  . Years of education: Not on file  . Highest education level: Not on file  Occupational History  . Not on file  Social Needs  . Financial resource strain: Not on file  . Food insecurity:    Worry: Not on file    Inability: Not on file  . Transportation needs:    Medical: Not on file    Non-medical: Not on file  Tobacco Use  . Smoking status: Current Every Day Smoker    Packs/day: 0.25  . Smokeless tobacco: Never Used  . Tobacco comment: currently at 4 cigarettes a day  Substance and Sexual Activity  . Alcohol use: Yes    Comment: social  . Drug use: Yes    Types: Cocaine  . Sexual activity: Not on file  Lifestyle  . Physical activity:    Days per week: Not on file    Minutes per session: Not on file  . Stress: Not on file  Relationships  . Social connections:    Talks on phone: Not on file    Gets together: Not on file    Attends religious service: Not on file    Active member of club or organization: Not on file    Attends meetings of clubs or organizations: Not on file    Relationship status: Not on file  . Intimate partner violence:    Fear of current or ex partner: Not on file    Emotionally abused: Not on file    Physically abused: Not on file    Forced sexual activity: Not on file  Other Topics Concern  . Not on file  Social History Narrative  . Not on file    FAMILY HISTORY: Family History  Problem Relation Age of Onset  . Diabetes Mother   . Hypertension Mother   . Hyperlipidemia Mother   . Arthritis Mother   . Hypertension Father   . Hyperlipidemia Father   . Heart attack Father        x2  . Breast cancer Neg Hx     ALLERGIES:  has No Known Allergies.  MEDICATIONS:  Current Outpatient Medications  Medication Sig Dispense Refill    . amLODipine (NORVASC) 10 MG tablet Take 10 mg by mouth daily.  11  . aspirin EC 81 MG tablet Take 81 mg by mouth daily.    . Cyanocobalamin (B-12) 1000 MCG CAPS Take 1,000 mcg by mouth daily. 30 capsule 3  . lidocaine-prilocaine (EMLA) cream Apply to affected area once 30 g 3  . ondansetron (ZOFRAN) 8 MG tablet Take 1 tablet (8 mg total) by mouth 2 (two) times daily as needed for refractory nausea / vomiting. Start on day 3 after chemo. 30 tablet 1  . prochlorperazine (COMPAZINE) 10 MG tablet Take 1 tablet (10 mg total) by mouth every 6 (six) hours as needed (Nausea or vomiting). 30 tablet 1  . umeclidinium-vilanterol (ANORO ELLIPTA) 62.5-25 MCG/INH AEPB Inhale 1 puff into the lungs daily as needed (shortness of breath).      No current facility-administered medications for this visit.    Facility-Administered Medications Ordered in Other Visits  Medication Dose Route Frequency Provider Last Rate Last Dose  . heparin lock flush 100 unit/mL  500 Units Intravenous Once Earlie Server, MD      . sodium chloride flush (NS)  0.9 % injection 10 mL  10 mL Intravenous PRN Earlie Server, MD   10 mL at 11/16/17 0907     PHYSICAL EXAMINATION: ECOG PERFORMANCE STATUS: 0 - Asymptomatic Vitals:   11/16/17 1000  BP: (!) 143/80  Pulse: 87  Resp: 18  Temp: (!) 97.5 F (36.4 C)   Filed Weights   11/16/17 1000  Weight: 187 lb 14.4 oz (85.2 kg)    Physical Exam  Constitutional: She is oriented to person, place, and time. She appears well-developed and well-nourished. No distress.  HENT:  Head: Normocephalic and atraumatic.  Right Ear: External ear normal.  Left Ear: External ear normal.  Mouth/Throat: Oropharynx is clear and moist.  Eyes: Pupils are equal, round, and reactive to light. Conjunctivae and EOM are normal. Left eye exhibits no discharge. No scleral icterus.  Neck: Normal range of motion. Neck supple.  Cardiovascular: Normal rate, regular rhythm and normal heart sounds.  No murmur  heard. Pulmonary/Chest: Effort normal and breath sounds normal. No respiratory distress. She has no wheezes. She has no rales. She exhibits no tenderness.  Abdominal: Soft. Bowel sounds are normal. She exhibits no distension and no mass. There is no tenderness. There is no guarding.  Musculoskeletal: Normal range of motion. She exhibits no edema or deformity.  Lymphadenopathy:    She has no cervical adenopathy.  Neurological: She is alert and oriented to person, place, and time. No cranial nerve deficit. Coordination normal.  Skin: Skin is warm and dry. No rash noted.  Psychiatric: She has a normal mood and affect. Her behavior is normal. Thought content normal.     LABORATORY DATA:  I have reviewed the data as listed Lab Results  Component Value Date   WBC 3.9 11/16/2017   HGB 10.1 (L) 11/16/2017   HCT 28.4 (L) 11/16/2017   MCV 102.0 (H) 11/16/2017   PLT 275 11/16/2017   Recent Labs    11/02/17 1039 11/09/17 1038 11/16/17 0908  NA 134* 134* 135  K 3.7 4.0 3.7  CL 101 103 104  CO2 23 23 24   GLUCOSE 212* 160* 131*  BUN 9 8 5*  CREATININE 0.64 0.59 0.43*  CALCIUM 9.0 8.9 8.8*  GFRNONAA >60 >60 >60  GFRAA >60 >60 >60  PROT 7.0 7.2 6.6  ALBUMIN 3.8 3.7 3.4*  AST 27 21 18   ALT 21 16 23   ALKPHOS 86 98 98  BILITOT 0.5 0.7 0.6       ASSESSMENT & PLAN:  Cancer Staging Non-small cell cancer of right lung (HCC) Staging form: Lung, AJCC 8th Edition - Clinical stage from 09/22/2017: Stage Unknown (cTX, cN1, cM0) - Signed by Earlie Server, MD on 09/22/2017 - Pathologic: No stage assigned - Unsigned  1. Non-small cell cancer of right lung ([place])   2. Encounter for antineoplastic chemotherapy   3. Low serum vitamin B12    #Clinical stage IIIA lung adenocarcinoma Stable, currently on chemo therapy with carbo Taxol and radiation treatment. Labs reviewed.  Counts acceptable.  She appears tolerating treatment very well with mild side effects. Proceed with carboplatin and Taxol  today. Repeat CBC CMP in 1 week prior to carboplatin and Taxol, and this will be her last treatment.  Her radiation finishing on July 1  Discussed with patient that after she finished concurrent chemoradiation, she will have about 3 to 4 weeks break.  I will repeat a CT scan during that timeframe to establish new baseline prior to starting immunotherapy.  I discussed the mechanism of action and  rationale of using immunotherapy with Durvalumab.  Discussed the potential side effects of immunotherapy including but not limited to diarrhea; skin rash; respiratory failure, neurotoxicity, elevated LFTs/endocrine abnormalities etc. patient was willing to proceed.  # Macrocytosis with anemi with borderline B12 levels, will give her 1 dose of parenteral B12 injection.  Continue oral B12 supplementation.   repeat B12 level in 5 weeks.    All questions were answered. The patient knows to call the clinic with any problems questions or concerns. Orders Placed This Encounter  Procedures  . CT Chest W Contrast    Standing Status:   Future    Standing Expiration Date:   11/16/2018    Order Specific Question:   If indicated for the ordered procedure, I authorize the administration of contrast media per Radiology protocol    Answer:   Yes    Order Specific Question:   Preferred imaging location?    Answer:   [facility]    Order Specific Question:   Radiology Contrast Protocol - do NOT remove file path    Answer:   [email or web address]    Return of visit: 1 week for last cycle of carbo and taxol, check CT chest in 4 weeks, repeat CBC CMP for assessment prior to starting immunotherapy. Total face to face encounter time for this patient visit was 63min. >50% of the time was  spent in counseling and coordination of care.   Earlie Server, MD, PhD Hematology Oncology [facility] at [facility] Pager- [phone] 11/16/2017

## 2017-11-17 ENCOUNTER — Ambulatory Visit
Admission: RE | Admit: 2017-11-17 | Discharge: 2017-11-17 | Disposition: A | Discharge status: Home / Self Care | Payer: Medicare HMO | Source: Ambulatory Visit | Attending: Radiation Oncology | Admitting: Radiation Oncology

## 2017-11-17 DIAGNOSIS — Z5111 Encounter for antineoplastic chemotherapy: Secondary | ICD-10-CM | POA: Diagnosis not present

## 2017-11-18 ENCOUNTER — Ambulatory Visit
Admission: RE | Admit: 2017-11-18 | Discharge: 2017-11-18 | Disposition: A | Discharge status: Home / Self Care | Payer: Medicare HMO | Source: Ambulatory Visit | Attending: Radiation Oncology | Admitting: Radiation Oncology

## 2017-11-18 DIAGNOSIS — Z5111 Encounter for antineoplastic chemotherapy: Secondary | ICD-10-CM | POA: Diagnosis not present

## 2017-11-19 ENCOUNTER — Ambulatory Visit
Admission: RE | Admit: 2017-11-19 | Discharge: 2017-11-19 | Disposition: A | Discharge status: Home / Self Care | Payer: Medicare HMO | Source: Ambulatory Visit | Attending: Radiation Oncology | Admitting: Radiation Oncology

## 2017-11-19 DIAGNOSIS — Z5111 Encounter for antineoplastic chemotherapy: Secondary | ICD-10-CM | POA: Diagnosis not present

## 2017-11-22 ENCOUNTER — Ambulatory Visit
Admission: RE | Admit: 2017-11-22 | Discharge: 2017-11-22 | Disposition: A | Discharge status: Home / Self Care | Payer: Medicare HMO | Source: Ambulatory Visit | Attending: Radiation Oncology | Admitting: Radiation Oncology

## 2017-11-22 DIAGNOSIS — Z5111 Encounter for antineoplastic chemotherapy: Secondary | ICD-10-CM | POA: Diagnosis not present

## 2017-11-23 ENCOUNTER — Inpatient Hospital Stay: Payer: Medicare HMO

## 2017-11-23 ENCOUNTER — Ambulatory Visit
Admission: RE | Admit: 2017-11-23 | Discharge: 2017-11-23 | Disposition: A | Discharge status: Home / Self Care | Payer: Medicare HMO | Source: Ambulatory Visit | Attending: Radiation Oncology | Admitting: Radiation Oncology

## 2017-11-23 VITALS — BP 145/78 | HR 94 | Temp 97.4°F | Resp 20 | Wt 188.0 lb

## 2017-11-23 DIAGNOSIS — C3491 Malignant neoplasm of unspecified part of right bronchus or lung: Secondary | ICD-10-CM

## 2017-11-23 DIAGNOSIS — Z5111 Encounter for antineoplastic chemotherapy: Secondary | ICD-10-CM | POA: Diagnosis not present

## 2017-11-23 LAB — CBC WITH DIFFERENTIAL/PLATELET
Basophils Absolute: 0 10*3/uL (ref 0–0.1)
Basophils Relative: 1 %
EOS ABS: 0.1 10*3/uL (ref 0–0.7)
Eosinophils Relative: 3 %
HEMATOCRIT: 30.5 % — AB (ref 35.0–47.0)
Hemoglobin: 10.7 g/dL — ABNORMAL LOW (ref 12.0–16.0)
LYMPHS ABS: 0.5 10*3/uL — AB (ref 1.0–3.6)
Lymphocytes Relative: 14 %
MCH: 36.2 pg — AB (ref 26.0–34.0)
MCHC: 35 g/dL (ref 32.0–36.0)
MCV: 103.3 fL — ABNORMAL HIGH (ref 80.0–100.0)
MONOS PCT: 12 %
Monocytes Absolute: 0.4 10*3/uL (ref 0.2–0.9)
NEUTROS ABS: 2.3 10*3/uL (ref 1.4–6.5)
NEUTROS PCT: 70 %
Platelets: 380 10*3/uL (ref 150–440)
RBC: 2.96 MIL/uL — ABNORMAL LOW (ref 3.80–5.20)
RDW: 14 % (ref 11.5–14.5)
WBC: 3.3 10*3/uL — ABNORMAL LOW (ref 3.6–11.0)

## 2017-11-23 LAB — COMPREHENSIVE METABOLIC PANEL
ALBUMIN: 3.7 g/dL (ref 3.5–5.0)
ALT: 32 U/L (ref 0–44)
AST: 24 U/L (ref 15–41)
Alkaline Phosphatase: 115 U/L (ref 38–126)
Anion gap: 7 (ref 5–15)
BUN: 6 mg/dL — ABNORMAL LOW (ref 8–23)
CHLORIDE: 102 mmol/L (ref 98–111)
CO2: 24 mmol/L (ref 22–32)
CREATININE: 0.36 mg/dL — AB (ref 0.44–1.00)
Calcium: 9.1 mg/dL (ref 8.9–10.3)
GFR calc non Af Amer: >60 mL/min (ref >60)
GLUCOSE: 110 mg/dL — AB (ref 70–99)
Potassium: 4 mmol/L (ref 3.5–5.1)
SODIUM: 133 mmol/L — AB (ref 135–145)
Total Bilirubin: 0.5 mg/dL (ref 0.3–1.2)
Total Protein: 7.4 g/dL (ref 6.5–8.1)

## 2017-11-23 LAB — TSH: TSH: 0.682 u[IU]/mL (ref 0.350–4.500)

## 2017-11-23 MED ORDER — FAMOTIDINE IN NACL 20-0.9 MG/50ML-% IV SOLN
20.0000 mg | Freq: Once | INTRAVENOUS | Status: AC
Start: 2017-11-23 — End: 2017-11-23
  Administered 2017-11-23: 20 mg via INTRAVENOUS
  Filled 2017-11-23: qty 50

## 2017-11-23 MED ORDER — SODIUM CHLORIDE 0.9 % IV SOLN
Freq: Once | INTRAVENOUS | Status: AC
  Administered 2017-11-23: 12:00:00 via INTRAVENOUS
  Filled 2017-11-23: qty 1000

## 2017-11-23 MED ORDER — DIPHENHYDRAMINE HCL 50 MG/ML IJ SOLN
50.0000 mg | Freq: Once | INTRAMUSCULAR | Status: AC
  Administered 2017-11-23: 50 mg via INTRAVENOUS
  Filled 2017-11-23: qty 1

## 2017-11-23 MED ORDER — SODIUM CHLORIDE 0.9 % IV SOLN
194.4000 mg | Freq: Once | INTRAVENOUS | Status: AC
  Administered 2017-11-23: 190 mg via INTRAVENOUS
  Filled 2017-11-23: qty 19

## 2017-11-23 MED ORDER — SODIUM CHLORIDE 0.9 % IV SOLN
20.0000 mg | Freq: Once | INTRAVENOUS | Status: AC
  Administered 2017-11-23: 20 mg via INTRAVENOUS
  Filled 2017-11-23: qty 2

## 2017-11-23 MED ORDER — SODIUM CHLORIDE 0.9 % IV SOLN
45.0000 mg/m2 | Freq: Once | INTRAVENOUS | Status: AC
  Administered 2017-11-23: 84 mg via INTRAVENOUS
  Filled 2017-11-23: qty 14

## 2017-11-23 MED ORDER — PALONOSETRON HCL INJECTION 0.25 MG/5ML
0.2500 mg | Freq: Once | INTRAVENOUS | Status: AC
  Administered 2017-11-23: 0.25 mg via INTRAVENOUS
  Filled 2017-11-23: qty 5

## 2017-11-23 MED ORDER — HEPARIN SOD (PORK) LOCK FLUSH 100 UNIT/ML IV SOLN
500.0000 [IU] | Freq: Once | INTRAVENOUS | Status: AC | PRN
  Administered 2017-11-23: 500 [IU]
  Filled 2017-11-23: qty 5

## 2017-11-24 ENCOUNTER — Ambulatory Visit
Admission: RE | Admit: 2017-11-24 | Discharge: 2017-11-24 | Disposition: A | Discharge status: Home / Self Care | Payer: Medicare HMO | Source: Ambulatory Visit | Attending: Radiation Oncology | Admitting: Radiation Oncology

## 2017-11-24 DIAGNOSIS — Z5111 Encounter for antineoplastic chemotherapy: Secondary | ICD-10-CM | POA: Diagnosis not present

## 2017-11-25 ENCOUNTER — Ambulatory Visit
Admission: RE | Admit: 2017-11-25 | Discharge: 2017-11-25 | Disposition: A | Discharge status: Home / Self Care | Payer: Medicare HMO | Source: Ambulatory Visit | Attending: Radiation Oncology | Admitting: Radiation Oncology

## 2017-11-25 DIAGNOSIS — Z5111 Encounter for antineoplastic chemotherapy: Secondary | ICD-10-CM | POA: Diagnosis not present

## 2017-11-26 ENCOUNTER — Ambulatory Visit
Admission: RE | Admit: 2017-11-26 | Discharge: 2017-11-26 | Disposition: A | Discharge status: Home / Self Care | Payer: Medicare HMO | Source: Ambulatory Visit | Attending: Radiation Oncology | Admitting: Radiation Oncology

## 2017-11-26 DIAGNOSIS — Z5111 Encounter for antineoplastic chemotherapy: Secondary | ICD-10-CM | POA: Diagnosis not present

## 2017-11-29 ENCOUNTER — Ambulatory Visit
Admission: RE | Admit: 2017-11-29 | Discharge: 2017-11-29 | Disposition: A | Discharge status: Home / Self Care | Payer: Medicare HMO | Source: Ambulatory Visit | Attending: Radiation Oncology | Admitting: Radiation Oncology

## 2017-11-29 DIAGNOSIS — C3491 Malignant neoplasm of unspecified part of right bronchus or lung: Secondary | ICD-10-CM | POA: Insufficient documentation

## 2017-11-29 DIAGNOSIS — Z510 Encounter for antineoplastic radiation therapy: Secondary | ICD-10-CM | POA: Diagnosis present

## 2017-12-20 ENCOUNTER — Ambulatory Visit
Admission: RE | Admit: 2017-12-20 | Discharge: 2017-12-20 | Disposition: A | Discharge status: Home / Self Care | Payer: Medicare HMO | Source: Ambulatory Visit | Attending: Oncology | Admitting: Oncology

## 2017-12-20 DIAGNOSIS — I82C11 Acute embolism and thrombosis of right internal jugular vein: Secondary | ICD-10-CM | POA: Insufficient documentation

## 2017-12-20 DIAGNOSIS — R911 Solitary pulmonary nodule: Secondary | ICD-10-CM | POA: Diagnosis not present

## 2017-12-20 DIAGNOSIS — C3491 Malignant neoplasm of unspecified part of right bronchus or lung: Secondary | ICD-10-CM | POA: Insufficient documentation

## 2017-12-20 HISTORY — DX: Essential (primary) hypertension: I10

## 2017-12-20 MED ORDER — IOPAMIDOL (ISOVUE-300) INJECTION 61%
75.0000 mL | Freq: Once | INTRAVENOUS | Status: AC | PRN
  Administered 2017-12-20: 75 mL via INTRAVENOUS

## 2017-12-21 ENCOUNTER — Telehealth (INDEPENDENT_AMBULATORY_CARE_PROVIDER_SITE_OTHER): Payer: Self-pay

## 2017-12-21 NOTE — Telephone Encounter (Signed)
Nurse called and stated that the patient's port tip has migrated into her neck and that it needs replacing per Dr. Tasia Catchings at the Cancer center.  What do you advise?

## 2017-12-22 ENCOUNTER — Encounter (INDEPENDENT_AMBULATORY_CARE_PROVIDER_SITE_OTHER): Payer: Self-pay

## 2017-12-22 NOTE — Telephone Encounter (Signed)
Patient has been scheduled for a port insertion on January 03, 2018.

## 2017-12-27 ENCOUNTER — Encounter: Payer: Self-pay | Admitting: Oncology

## 2017-12-27 ENCOUNTER — Inpatient Hospital Stay: Payer: Medicare HMO

## 2017-12-27 ENCOUNTER — Other Ambulatory Visit (INDEPENDENT_AMBULATORY_CARE_PROVIDER_SITE_OTHER): Payer: Self-pay | Admitting: Vascular Surgery

## 2017-12-27 ENCOUNTER — Inpatient Hospital Stay: Payer: Medicare HMO | Attending: Oncology | Admitting: Oncology

## 2017-12-27 ENCOUNTER — Other Ambulatory Visit: Payer: Self-pay

## 2017-12-27 VITALS — BP 141/83 | HR 72 | Temp 96.2°F | Resp 20 | Wt 192.1 lb

## 2017-12-27 DIAGNOSIS — Z79899 Other long term (current) drug therapy: Secondary | ICD-10-CM | POA: Insufficient documentation

## 2017-12-27 DIAGNOSIS — J984 Other disorders of lung: Secondary | ICD-10-CM | POA: Insufficient documentation

## 2017-12-27 DIAGNOSIS — Z95828 Presence of other vascular implants and grafts: Secondary | ICD-10-CM

## 2017-12-27 DIAGNOSIS — Z5112 Encounter for antineoplastic immunotherapy: Secondary | ICD-10-CM | POA: Diagnosis not present

## 2017-12-27 DIAGNOSIS — D7589 Other specified diseases of blood and blood-forming organs: Secondary | ICD-10-CM

## 2017-12-27 DIAGNOSIS — Z720 Tobacco use: Secondary | ICD-10-CM | POA: Diagnosis not present

## 2017-12-27 DIAGNOSIS — C3491 Malignant neoplasm of unspecified part of right bronchus or lung: Secondary | ICD-10-CM

## 2017-12-27 DIAGNOSIS — R05 Cough: Secondary | ICD-10-CM | POA: Insufficient documentation

## 2017-12-27 DIAGNOSIS — R5383 Other fatigue: Secondary | ICD-10-CM | POA: Insufficient documentation

## 2017-12-27 LAB — CBC WITH DIFFERENTIAL/PLATELET
Basophils Absolute: 0 10*3/uL (ref 0–0.1)
Basophils Relative: 1 %
EOS PCT: 8 %
Eosinophils Absolute: 0.4 10*3/uL (ref 0–0.7)
HCT: 32.6 % — ABNORMAL LOW (ref 35.0–47.0)
Hemoglobin: 11.2 g/dL — ABNORMAL LOW (ref 12.0–16.0)
LYMPHS ABS: 0.9 10*3/uL — AB (ref 1.0–3.6)
LYMPHS PCT: 19 %
MCH: 34.6 pg — AB (ref 26.0–34.0)
MCHC: 34.5 g/dL (ref 32.0–36.0)
MCV: 100.5 fL — AB (ref 80.0–100.0)
MONO ABS: 0.4 10*3/uL (ref 0.2–0.9)
Monocytes Relative: 10 %
Neutro Abs: 2.7 10*3/uL (ref 1.4–6.5)
Neutrophils Relative %: 62 %
PLATELETS: 391 10*3/uL (ref 150–440)
RBC: 3.24 MIL/uL — AB (ref 3.80–5.20)
RDW: 13.8 % (ref 11.5–14.5)
WBC: 4.4 10*3/uL (ref 3.6–11.0)

## 2017-12-27 LAB — COMPREHENSIVE METABOLIC PANEL
ALBUMIN: 3.5 g/dL (ref 3.5–5.0)
ALT: 15 U/L (ref 0–44)
AST: 23 U/L (ref 15–41)
Alkaline Phosphatase: 105 U/L (ref 38–126)
Anion gap: 9 (ref 5–15)
BUN: 7 mg/dL — AB (ref 8–23)
CHLORIDE: 103 mmol/L (ref 98–111)
CO2: 24 mmol/L (ref 22–32)
Calcium: 8.7 mg/dL — ABNORMAL LOW (ref 8.9–10.3)
Creatinine, Ser: 0.57 mg/dL (ref 0.44–1.00)
GFR calc Af Amer: >60 mL/min (ref >60)
GFR calc non Af Amer: >60 mL/min (ref >60)
GLUCOSE: 194 mg/dL — AB (ref 70–99)
Potassium: 4.1 mmol/L (ref 3.5–5.1)
Sodium: 136 mmol/L (ref 135–145)
Total Bilirubin: 0.3 mg/dL (ref 0.3–1.2)
Total Protein: 7.3 g/dL (ref 6.5–8.1)

## 2017-12-27 LAB — TSH: TSH: 2.122 u[IU]/mL (ref 0.350–4.500)

## 2017-12-27 MED ORDER — SODIUM CHLORIDE 0.9 % IV SOLN
Freq: Once | INTRAVENOUS | Status: AC
  Administered 2017-12-27: 11:00:00 via INTRAVENOUS
  Filled 2017-12-27: qty 1000

## 2017-12-27 MED ORDER — SODIUM CHLORIDE 0.9 % IV SOLN
10.0000 mg/kg | Freq: Once | INTRAVENOUS | Status: AC
  Administered 2017-12-27: 860 mg via INTRAVENOUS
  Filled 2017-12-27: qty 10

## 2017-12-27 NOTE — Progress Notes (Signed)
Patient here for follow up. She states she has appointment with Dr. Lucky Cowboy next week for possible replacement of port.

## 2017-12-27 NOTE — Progress Notes (Signed)
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   HISTORY OF PRESENTING ILLNESS:  Connie Powers is a  67 y.o.  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  CT lung cancer screen 05/27/2017   1. A few bilateral pulmonary nodules measuring up to 5 mm are indeterminate. Considering the suspicious 2.0 cm mediastinal adenopathy, follow up with PET/CT is recommended. 2. Mild bronchial wall thickening could represent airway disease including bronchitis.ACR Lung-RADS Category and Recommendation*: ACR Lung-RADS Category 2S (S modifier for mediastinal adenopathy)  PET scan 3/112019 1. No hypermetabolic pulmonary nodules.2. Hypermetabolic enlarged right paratracheal lymph node, of uncertain etiology in isolation. Lymphoproliferative disorder cannot be excluded. 3.  Aortic atherosclerosis (ICD10-170.0).  EBUS biopsy of paratracheal node showed non small cell lung cancer favoring adenocarcinoma. Case was discussed on tumor board on 09/16/2017 and consensus recommendation is to treat as Stage IIIA disease given the invasion of mediastinum.   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 MRI brain is contraindicated. CT brain negative for metastatic disease.    INTERVAL HISTORY Connie Powers is a 67 y.o. female who has above history reviewed by me today present for  maintenance immunotherapy for lung cancer treatment.   During the interval, patient also had an interim CT chest  done and present to discuss image report.    # Cough, stable Not associated with any hemoptysis. #Fatigue: Stable.  Not better or worse.    Current  Treatment S/p concurrent Chemotherapy [name] /taxol  Weekly x 6] and RT.   Review of Systems  Constitutional: Positive for malaise/fatigue. Negative for chills, fever and weight loss.  HENT: Negative for congestion, ear discharge, ear pain, hearing loss, nosebleeds, sinus pain and sore throat.   Eyes: Negative for double vision, photophobia, pain, discharge and redness.  Respiratory: Negative for cough, hemoptysis, sputum production, shortness of breath and wheezing.   Cardiovascular: Negative for chest pain, palpitations, orthopnea, claudication and leg swelling.  Gastrointestinal: Negative for abdominal pain, blood in stool, constipation, diarrhea, heartburn, melena, nausea and vomiting.  Genitourinary: Negative for dysuria, flank pain, frequency and hematuria.  Musculoskeletal: Negative for back pain, myalgias and neck pain.  Skin: Negative for itching and rash.  Neurological: Negative for dizziness, tingling, tremors, sensory change, focal weakness, weakness and headaches.  Endo/Heme/Allergies: Negative for environmental allergies. Does not bruise/bleed easily.  Psychiatric/Behavioral: Negative for depression, hallucinations and substance abuse. The patient is not nervous/anxious.     MEDICAL HISTORY:  Past Medical History:  Diagnosis Date  . Brain aneurysm   . Difficult intubation   . Hypertension   . Non-small cell cancer of right lung ([place]) 09/22/2017   Chemo + Rad tx's.   . Viral meningitis     SURGICAL HISTORY: Past Surgical History:  Procedure Laterality Date  . ENDOBRONCHIAL ULTRASOUND N/A 09/09/2017   Procedure: ENDOBRONCHIAL ULTRASOUND;  Surgeon: Laverle Hobby, MD;  Location: [facility] ORS;  Service: Pulmonary;  Laterality: N/A;  . OOPHORECTOMY Left   . PORTA CATH INSERTION N/A 09/29/2017   Procedure: PORTA CATH INSERTION;  Surgeon: Algernon Huxley, MD;  Location: [place] [facility];  Service: Cardiovascular;  Laterality: N/A;  . surgical  repair for brain tumor  1969     clips in head  . WISDOM TOOTH EXTRACTION      SOCIAL HISTORY: Social History   Socioeconomic History  . Marital status: Married    Spouse name: Not on file  . Number of children: Not on file  . Years of education: Not on file  . Highest education level: Not on file  Occupational History  . Not on file  Social Needs  . Financial resource strain: Not on file  . Food insecurity:    Worry: Not on file    Inability: Not on file  . Transportation needs:    Medical: Not on file    Non-medical: Not on file  Tobacco Use  . Smoking status: Current Every Day Smoker    Packs/day: 0.25  . Smokeless tobacco: Never Used  . Tobacco comment: currently at 4 cigarettes a day  Substance and Sexual Activity  . Alcohol use: Yes    Comment: social  . Drug use: Yes    Types: Cocaine  . Sexual activity: Not on file  Lifestyle  . Physical activity:    Days per week: Not on file    Minutes per session: Not on file  . Stress: Not on file  Relationships  . Social connections:    Talks on phone: Not on file    Gets together: Not on file    Attends religious service: Not on file    Active member of club or organization: Not on file    Attends meetings of clubs or organizations: Not on file    Relationship status: Not on file  . Intimate partner violence:    Fear of current or ex partner: Not on file    Emotionally abused: Not on file    Physically abused: Not on file    Forced sexual activity: Not on file  Other Topics Concern  . Not on file  Social History Narrative  . Not on file    FAMILY HISTORY: Family History  Problem Relation Age of Onset  . Diabetes Mother   . Hypertension Mother   . Hyperlipidemia Mother   . Arthritis Mother   . Hypertension Father   . Hyperlipidemia Father   . Heart attack Father        x2  . Breast cancer Neg Hx     ALLERGIES:  has No Known Allergies.  MEDICATIONS:  Current Outpatient Medications  Medication Sig Dispense Refill  .  amLODipine (NORVASC) 10 MG tablet Take 10 mg by mouth daily.  11  . aspirin EC 81 MG tablet Take 81 mg by mouth daily.    . Cyanocobalamin (B-12) 1000 MCG CAPS Take 1,000 mcg by mouth daily. 30 capsule 3  . lidocaine-prilocaine (EMLA) cream Apply to affected area once 30 g 3  . ondansetron (ZOFRAN) 8 MG tablet Take 1 tablet (8 mg total) by mouth 2 (two) times daily as needed for refractory nausea / vomiting. Start on day 3 after chemo. 30 tablet 1  . prochlorperazine (COMPAZINE) 10 MG tablet Take 1 tablet (10 mg total) by mouth every 6 (six) hours as needed (Nausea or vomiting). 30 tablet 1  . umeclidinium-vilanterol (ANORO ELLIPTA) 62.5-25 MCG/INH AEPB Inhale 1 puff into the lungs daily as needed (shortness of breath).      No current facility-administered medications for this visit.      PHYSICAL EXAMINATION: ECOG PERFORMANCE STATUS: 0 - Asymptomatic Vitals:   12/27/17 0913  BP: (!) 141/83  Pulse: 72  Resp: 20  Temp: (!) 96.2 F (35.7 C)   Filed Weights   12/27/17 0913  Weight: 192 lb 1.6 oz (87.1 kg)    Physical Exam  Constitutional: She is oriented to person, place, and time. She appears well-developed and well-nourished. No distress.  HENT:  Head: Normocephalic and atraumatic.  Right Ear: External ear normal.  Left Ear: External ear normal.  Mouth/Throat: Oropharynx is clear and moist.  Eyes: Pupils are equal, round, and reactive to light. Conjunctivae and EOM are normal. Left eye exhibits no discharge. No scleral icterus.  Neck: Normal range of motion. Neck supple.  Cardiovascular: Normal rate, regular rhythm and normal heart sounds.  No murmur heard. Pulmonary/Chest: Effort normal. No respiratory distress. She has no wheezes. She has no rales. She exhibits no tenderness.  Decreased breath sound bilaterally.   Abdominal: Soft. Bowel sounds are normal. She exhibits no distension and no mass. There is no tenderness. There is no guarding.  Musculoskeletal: Normal range of  motion. She exhibits no edema or deformity.  Lymphadenopathy:    She has no cervical adenopathy.  Neurological: She is alert and oriented to person, place, and time. No cranial nerve deficit. Coordination normal.  Skin: Skin is warm and dry. No rash noted.  Psychiatric: She has a normal mood and affect. Her behavior is normal. Thought content normal.     LABORATORY DATA:  I have reviewed the data as listed Lab Results  Component Value Date   WBC 4.4 12/27/2017   HGB 11.2 (L) 12/27/2017   HCT 32.6 (L) 12/27/2017   MCV 100.5 (H) 12/27/2017   PLT 391 12/27/2017   Recent Labs    11/16/17 0908 11/23/17 1054 12/27/17 0848  NA 135 133* 136  K 3.7 4.0 4.1  CL 104 102 103  CO2 24 24 24   GLUCOSE 131* 110* 194*  BUN 5* 6* 7*  CREATININE 0.43* 0.36* 0.57  CALCIUM 8.8* 9.1 8.7*  GFRNONAA >60 >60 >60  GFRAA >60 >60 >60  PROT 6.6 7.4 7.3  ALBUMIN 3.4* 3.7 3.5  AST 18 24 23   ALT 23 32 15  ALKPHOS 98 115 105  BILITOT 0.6 0.5 0.3       ASSESSMENT & PLAN:  Cancer Staging Non-small cell cancer of right lung (HCC) Staging form: Lung, AJCC 8th Edition - Clinical stage from 09/22/2017: Stage Unknown (cTX, cN1, cM0) - Signed by Earlie Server, MD on 09/22/2017 - Pathologic: No stage assigned - Unsigned  1. Non-small cell cancer of right lung ([place])   2. Macrocytosis without anemia   3. Encounter for antineoplastic immunotherapy   4. Port-A-Cath in place    #Clinical stage IIIA lung adenocarcinoma Repeat CT scan showed interval decreased of disease. CT images were Independently reviewed by me and discussed with patient. New 26mm right lung lesion, indeterminate.  Labs reviewed, acceptable to proceed with immunotherapy.    I discussed the mechanism of action and rationale of using immunotherapy Durvaluamab for maintenance treatment x 1 year.   Discussed the potential side effects of immunotherapy including but not limited to diarrhea; skin rash; respiratory failure, neurotoxicity, elevated  LFTs/endocrine abnormalities etc. Patient voices understanding and willing to proceed with immunotherapy.  Baseline TSH normal.   # For the new 8 mm lung lesion, non specific finding. Discuss with RadOnc Dr.Chrystal. Plan close monitor, repeat CT scan in 2 months.   # Macrocytosis with anemia with borderline B12 levels, will give her 1 dose of parenteral B12 injection.  Continue  oral B12 supplementation.  Will repeat B12 levels at next visit.    # Medi port Tip position need to be optimized. She has been referred back to Dr.Dew for further evaluation.  Today her medi port has no blood return. Will give today's treatment using peripheral IV access.    All questions were answered. The patient knows to call the clinic with any problems questions or concerns. Orders Placed This Encounter  Procedures  . Vitamin B12    Standing Status:   Future    Standing Expiration Date:   12/28/2018    Return of visit: 2 weeks for assessment prior to next cycle of immunotherapy.  Total face to face encounter time for this patient visit was 25 min. >50% of the time was  spent in counseling and coordination of care.  Earlie Server, MD, PhD Hematology Oncology [facility] Pager- [phone] 12/27/2017

## 2018-01-02 MED ORDER — CEFAZOLIN SODIUM-DEXTROSE 2-4 GM/100ML-% IV SOLN: 2.0000 g | Freq: Once | INTRAVENOUS | Status: DC

## 2018-01-03 ENCOUNTER — Other Ambulatory Visit: Payer: Self-pay

## 2018-01-03 ENCOUNTER — Encounter: Payer: Self-pay | Admitting: *Deleted

## 2018-01-03 ENCOUNTER — Encounter
Admission: RE | Disposition: A | Discharge status: Home / Self Care | Payer: Self-pay | Source: Ambulatory Visit | Attending: Vascular Surgery

## 2018-01-03 ENCOUNTER — Ambulatory Visit
Admission: RE | Admit: 2018-01-03 | Discharge: 2018-01-03 | Disposition: A | Discharge status: Home / Self Care | Payer: Medicare HMO | Source: Ambulatory Visit | Attending: Vascular Surgery | Admitting: Vascular Surgery

## 2018-01-03 ENCOUNTER — Ambulatory Visit
Admission: RE | Admit: 2018-01-03 | Discharge: 2018-01-03 | Disposition: A | Discharge status: Home / Self Care | Payer: Medicare HMO | Source: Ambulatory Visit | Attending: Radiation Oncology | Admitting: Radiation Oncology

## 2018-01-03 VITALS — BP 133/81 | HR 89 | Temp 97.5°F | Resp 12 | Ht 64.0 in | Wt 190.0 lb

## 2018-01-03 DIAGNOSIS — Z8249 Family history of ischemic heart disease and other diseases of the circulatory system: Secondary | ICD-10-CM | POA: Insufficient documentation

## 2018-01-03 DIAGNOSIS — Z9889 Other specified postprocedural states: Secondary | ICD-10-CM | POA: Insufficient documentation

## 2018-01-03 DIAGNOSIS — Z87891 Personal history of nicotine dependence: Secondary | ICD-10-CM | POA: Insufficient documentation

## 2018-01-03 DIAGNOSIS — I82C19 Acute embolism and thrombosis of unspecified internal jugular vein: Secondary | ICD-10-CM | POA: Insufficient documentation

## 2018-01-03 DIAGNOSIS — Z9221 Personal history of antineoplastic chemotherapy: Secondary | ICD-10-CM | POA: Diagnosis not present

## 2018-01-03 DIAGNOSIS — C3491 Malignant neoplasm of unspecified part of right bronchus or lung: Secondary | ICD-10-CM | POA: Diagnosis not present

## 2018-01-03 DIAGNOSIS — Z08 Encounter for follow-up examination after completed treatment for malignant neoplasm: Secondary | ICD-10-CM | POA: Insufficient documentation

## 2018-01-03 DIAGNOSIS — T82524A Displacement of infusion catheter, initial encounter: Secondary | ICD-10-CM | POA: Diagnosis not present

## 2018-01-03 DIAGNOSIS — Z923 Personal history of irradiation: Secondary | ICD-10-CM | POA: Diagnosis not present

## 2018-01-03 DIAGNOSIS — Z452 Encounter for adjustment and management of vascular access device: Secondary | ICD-10-CM | POA: Diagnosis not present

## 2018-01-03 DIAGNOSIS — I10 Essential (primary) hypertension: Secondary | ICD-10-CM

## 2018-01-03 DIAGNOSIS — C3490 Malignant neoplasm of unspecified part of unspecified bronchus or lung: Secondary | ICD-10-CM

## 2018-01-03 DIAGNOSIS — Z90721 Acquired absence of ovaries, unilateral: Secondary | ICD-10-CM | POA: Insufficient documentation

## 2018-01-03 HISTORY — PX: PORTA CATH INSERTION: CATH118285

## 2018-01-03 SURGERY — PORTA CATH INSERTION
Anesthesia: Moderate Sedation

## 2018-01-03 MED ORDER — FAMOTIDINE 20 MG PO TABS: 40.0000 mg | ORAL_TABLET | ORAL | Status: DC | PRN

## 2018-01-03 MED ORDER — METHYLPREDNISOLONE SODIUM SUCC 125 MG IJ SOLR: 125.0000 mg | INTRAMUSCULAR | Status: DC | PRN

## 2018-01-03 MED ORDER — SODIUM CHLORIDE 0.9 % IV SOLN
Freq: Once | INTRAVENOUS | Status: DC
  Filled 2018-01-03: qty 2

## 2018-01-03 MED ORDER — MIDAZOLAM HCL 5 MG/5ML IJ SOLN
INTRAMUSCULAR | Status: AC
  Filled 2018-01-03: qty 5

## 2018-01-03 MED ORDER — HYDROMORPHONE HCL 1 MG/ML IJ SOLN: 1.0000 mg | Freq: Once | INTRAMUSCULAR | Status: DC | PRN

## 2018-01-03 MED ORDER — CEFAZOLIN SODIUM-DEXTROSE 2-4 GM/100ML-% IV SOLN
INTRAVENOUS | Status: AC
  Administered 2018-01-03: 2 g
  Filled 2018-01-03: qty 100

## 2018-01-03 MED ORDER — FENTANYL CITRATE (PF) 100 MCG/2ML IJ SOLN
INTRAMUSCULAR | Status: DC | PRN
  Administered 2018-01-03 (×2): 25 ug via INTRAVENOUS
  Administered 2018-01-03: 50 ug via INTRAVENOUS

## 2018-01-03 MED ORDER — LIDOCAINE-EPINEPHRINE (PF) 1 %-1:200000 IJ SOLN
INTRAMUSCULAR | Status: AC
  Filled 2018-01-03: qty 30

## 2018-01-03 MED ORDER — MIDAZOLAM HCL 2 MG/2ML IJ SOLN
INTRAMUSCULAR | Status: DC | PRN
  Administered 2018-01-03 (×3): 1 mg via INTRAVENOUS
  Administered 2018-01-03: 2 mg via INTRAVENOUS

## 2018-01-03 MED ORDER — SODIUM CHLORIDE 0.9 % IV SOLN
INTRAVENOUS | Status: DC
  Administered 2018-01-03: 14:00:00 via INTRAVENOUS

## 2018-01-03 MED ORDER — IOPAMIDOL (ISOVUE-300) INJECTION 61%
INTRAVENOUS | Status: DC | PRN
  Administered 2018-01-03: 10 mL via INTRAVENOUS

## 2018-01-03 MED ORDER — HEPARIN SODIUM (PORCINE) 10000 UNIT/ML IJ SOLN
INTRAMUSCULAR | Status: AC
Start: 2018-01-03 — End: ?
  Filled 2018-01-03: qty 1

## 2018-01-03 MED ORDER — FENTANYL CITRATE (PF) 100 MCG/2ML IJ SOLN
INTRAMUSCULAR | Status: AC
  Filled 2018-01-03: qty 2

## 2018-01-03 MED ORDER — ONDANSETRON HCL 4 MG/2ML IJ SOLN: 4.0000 mg | Freq: Four times a day (QID) | INTRAMUSCULAR | Status: DC | PRN

## 2018-01-03 MED ORDER — HEPARIN (PORCINE) IN NACL 1000-0.9 UT/500ML-% IV SOLN
INTRAVENOUS | Status: AC
  Filled 2018-01-03: qty 500

## 2018-01-03 SURGICAL SUPPLY — 13 items
CATH BEACON 5 .035 40 KMP TP (CATHETERS) ×1 IMPLANT
CATH BEACON 5 .038 40 KMP TP (CATHETERS) ×1
DRAPE INCISE IOBAN 66X45 STRL (DRAPES) ×4 IMPLANT
GLIDEWIRE STIFF .35X180X3 HYDR (WIRE) ×2 IMPLANT
KIT PORT POWER 8FR ISP CVUE (Port) ×2 IMPLANT
PACK ANGIOGRAPHY (CUSTOM PROCEDURE TRAY) ×2 IMPLANT
PAD GROUND ADULT SPLIT (MISCELLANEOUS) ×2 IMPLANT
PENCIL ELECTRO HAND CTR (MISCELLANEOUS) ×2 IMPLANT
SHEATH BRITE TIP 6FRX11 (SHEATH) ×2 IMPLANT
SUT MNCRL AB 4-0 PS2 18 (SUTURE) ×2 IMPLANT
SUT PROLENE 0 CT 1 30 (SUTURE) ×2 IMPLANT
SUT VIC AB 3-0 SH 27 (SUTURE) ×1
SUT VIC AB 3-0 SH 27X BRD (SUTURE) ×1 IMPLANT

## 2018-01-03 NOTE — Discharge Instructions (Signed)
Implanted Port Home Guide An implanted port is a type of central line that is placed under the skin. Central lines are used to provide IV access when treatment or nutrition needs to be given through a person's veins. Implanted ports are used for long-term IV access. An implanted port may be placed because:  You need IV medicine that would be irritating to the small veins in your hands or arms.  You need long-term IV medicines, such as antibiotics.  You need IV nutrition for a long period.  You need frequent blood draws for lab tests.  You need dialysis.  Implanted ports are usually placed in the chest area, but they can also be placed in the upper arm, the abdomen, or the leg. An implanted port has two main parts:  Reservoir. The reservoir is round and will appear as a small, raised area under your skin. The reservoir is the part where a needle is inserted to give medicines or draw blood.  Catheter. The catheter is a thin, flexible tube that extends from the reservoir. The catheter is placed into a large vein. Medicine that is inserted into the reservoir goes into the catheter and then into the vein.  How will I care for my incision site? Do not get the incision site wet. Bathe or shower as directed by your health care provider. How is my port accessed? Special steps must be taken to access the port:  Before the port is accessed, a numbing cream can be placed on the skin. This helps numb the skin over the port site.  Your health care provider uses a sterile technique to access the port. ? Your health care provider must put on a mask and sterile gloves. ? The skin over your port is cleaned carefully with an antiseptic and allowed to dry. ? The port is gently pinched between sterile gloves, and a needle is inserted into the port.  Only "non-coring" port needles should be used to access the port. Once the port is accessed, a blood return should be checked. This helps ensure that the port  is in the vein and is not clogged.  If your port needs to remain accessed for a constant infusion, a clear (transparent) bandage will be placed over the needle site. The bandage and needle will need to be changed every week, or as directed by your health care provider.  Keep the bandage covering the needle clean and dry. Do not get it wet. Follow your health care provider's instructions on how to take a shower or bath while the port is accessed.  If your port does not need to stay accessed, no bandage is needed over the port.  What is flushing? Flushing helps keep the port from getting clogged. Follow your health care provider's instructions on how and when to flush the port. Ports are usually flushed with saline solution or a medicine called heparin. The need for flushing will depend on how the port is used.  If the port is used for intermittent medicines or blood draws, the port will need to be flushed: ? After medicines have been given. ? After blood has been drawn. ? As part of routine maintenance.  If a constant infusion is running, the port may not need to be flushed.  How long will my port stay implanted? The port can stay in for as long as your health care provider thinks it is needed. When it is time for the port to come out, surgery will be   done to remove it. The procedure is similar to the one performed when the port was put in. When should I seek immediate medical care? When you have an implanted port, you should seek immediate medical care if:  You notice a bad smell coming from the incision site.  You have swelling, redness, or drainage at the incision site.  You have more swelling or pain at the port site or the surrounding area.  You have a fever that is not controlled with medicine.  This information is not intended to replace advice given to you by your health care provider. Make sure you discuss any questions you have with your health care provider. Document  Released: 05/18/2005 Document Revised: 10/24/2015 Document Reviewed: 01/23/2013 Elsevier Interactive Patient Education  2017 Elsevier Inc.  

## 2018-01-03 NOTE — Progress Notes (Signed)
Radiation Oncology Follow up Note  Name: Connie Powers   Date:   01/03/2018 MRN:  [id number] DOB: [birth year]    This 67 y.o. female presents to the clinic today for one-month follow-up status post concurrent chemoradiation therapy for stage IIIa (T4 N0 M0 adenocarcinoma the right lung.  REFERRING PROVIDER: Sharyne Peach, MD  HPI: patient is a 67 year old female now out 1 month having completed concurrent chemoradiation therapy.for stage IIIa non-small cell lung cancer of the right lung. She received 7000 cGy and is seen today in routine follow-up is doing well. She specifically denies cough hemoptysis chest tightness or dysphagia.she is currently on maintenanceDurvaluamab which she is tolerating well.no follow-up CT scans have been performed.    COMPLICATIONS OF TREATMENT: none  FOLLOW UP COMPLIANCE: keeps appointments   PHYSICAL EXAM:  BP 133/81 (BP Location: Left Arm, Patient Position: Sitting, Cuff Size: Normal)   Pulse 89   Temp (!) 97.5 F (36.4 C)   Resp 12   Ht 5\' 4"  (1.626 m)   Wt 190 lb (86.2 kg)   BMI 32.61 kg/m  Well-developed well-nourished patient in NAD. HEENT reveals PERLA, EOMI, discs not visualized.  Oral cavity is clear. No oral mucosal lesions are identified. Neck is clear without evidence of cervical or supraclavicular adenopathy. Lungs are clear to A&P. Cardiac examination is essentially unremarkable with regular rate and rhythm without murmur rub or thrill. Abdomen is benign with no organomegaly or masses noted. Motor sensory and DTR levels are equal and symmetric in the upper and lower extremities. Cranial nerves II through XII are grossly intact. Proprioception is intact. No peripheral adenopathy or edema is identified. No motor or sensory levels are noted. Crude visual fields are within normal range.  RADIOLOGY RESULTS: o current films for review  PLAN: present time patient is doing well she continues on immunotherapy at this time. I have asked to  see her out in approximately 3-4 months for follow-up. Expect a CT scan of her chest in that time period and will review that when it is available. Patient knows to call with any concerns.  I would like to take this opportunity to thank you for allowing me to participate in the care of your patient.Noreene Filbert, MD

## 2018-01-03 NOTE — Op Note (Signed)
[facility]       Operative Note  Date: 01/03/2018  Preoperative diagnosis:  1. Lung cancer, right jugular thrombosis with malpositioned port  Postoperative diagnosis:  Same as above  Procedures: #1. Removal of right jugular port #2.  Right jugular venogram and superior venacavogram including catheter placement into the superior vena cava #3. Fluoroscopic guidance for placement of catheter. #4. Placement of CT compatible Port-A-Cath, right internal jugular vein.  Surgeon: Leotis Pain, MD.   Anesthesia: Local with moderate conscious sedation for approximately 25 minutes using 5 mg of Versed and 100 Mcg of Fentanyl  Fluoroscopy time: 2.3 minutes  Contrast used: 10 cc  Estimated blood loss: 5 cc  Indication for the procedure:  The patient is a 67 y.o.female with a previous port placement that has removed from the central location and is now cephalad going in the jugular vein.  Associated with this was some local thrombus within the jugular vein.  The patient needs a Port-A-Cath for durable venous access, chemotherapy, lab draws, and CT scans. We are asked to place this. Risks and benefits were discussed and informed consent was obtained.  Description of procedure: The patient was brought to the vascular and interventional radiology suite.  Moderate conscious sedation was administered throughout the procedure during a face to face encounter with the patient with my supervision of the RN administering medicines and monitoring the patient's vital signs, pulse oximetry, telemetry and mental status throughout from the start of the procedure until the patient was taken to the recovery room. The right neck chest and shoulder were sterilely prepped and draped, and a sterile surgical field was created.  The previous subclavicular incision was reopened, and the catheter was dissected out from the attachments.  The port was dissected from the pocket and then removed.  The  catheter was transected and a wire was placed.  Fluoroscopy showed that the catheter was going cephalad in the jugular vein.  The existing catheter and port were removed completely.  I then did an image with a Kumpe catheter in the jugular vein which showed only a small amount of thrombus in the jugular vein at the area of insertion and none more centrally.  Using a Kumpe catheter and Glidewire I was able to advance and flipped the catheter down to the innominate vein and down into the superior vena cava and right atrium.  Imaging showed the superior vena cava to be widely patent.  The Kumpe catheter was then removed and the peel-away sheath was placed over the Glidewire.  The catheter was then parked in the right atrium and the peel-away sheath and wire were removed.  The catheter was then connected to a completely new port which was tucked into the previous pocket. Fluoroscopic guidance was then used to cut the catheter to an appropriate length. The catheter tip was parked in excellent location under fluorocoscopic guidance in the right atrium.  I elected to leave this quite deep to avoid any further dislodgment even though it may not return blood well. The pocket was then irrigated with antibiotic impregnated saline and the wound was closed with a running 3-0 Vicryl and a 4-0 Monocryl. The Huber needle was used to flush the port with heparinized saline. Dermabond was then placed as a dressing. The patient tolerated the procedure well and was taken to the recovery room in stable condition.   Leotis Pain 01/03/2018 3:52 PM   This note was created with [facility]  transcription system. Any errors in dictation are purely unintentional.

## 2018-01-03 NOTE — H&P (Signed)
[facility] Admission History & Physical  MRN : [id number]  Connie Powers is a 67 y.o. ([birth year]) female who presents with chief complaint of No chief complaint on file. Marland Kitchen  History of Present Illness: Patient presents with a malpositioned Port-A-Cath.  Her Port-A-Cath has been used for lung cancer for about 3 months now.  On a recent CT scan it was noted that the catheter is no longer in a central location and appears to be going cephalad.  There is thrombus in the jugular vein below the insertion of the port in the jugular vein.  This thrombus does not appear to progress into the superior vena cava.  She is not really that symptomatic from that and apparently her Port-A-Cath was being used.  Nonetheless, with this finding it will need to be revised or removed.  No other complaints today.  Current Facility-Administered Medications  Medication Dose Route Frequency Provider Last Rate Last Dose  . 0.9 %  sodium chloride infusion   Intravenous Continuous Stegmayer, Kimberly A, PA-C      . ceFAZolin (ANCEF) 2-4 GM/100ML-% IVPB           . ceFAZolin (ANCEF) IVPB 2g/100 mL premix  2 g Intravenous Once Stegmayer, Kimberly A, PA-C      . famotidine (PEPCID) tablet 40 mg  40 mg Oral PRN Stegmayer, Kimberly A, PA-C      . gentamicin (GARAMYCIN) 80 mg in sodium chloride 0.9 % 500 mL irrigation   Irrigation Once Stegmayer, Kimberly A, PA-C      . HYDROmorphone (DILAUDID) injection 1 mg  1 mg Intravenous Once PRN Stegmayer, Kimberly A, PA-C      . methylPREDNISolone sodium succinate (SOLU-MEDROL) 125 mg/2 mL injection 125 mg  125 mg Intravenous PRN Stegmayer, Kimberly A, PA-C      . ondansetron (ZOFRAN) injection 4 mg  4 mg Intravenous Q6H PRN Stegmayer, Janalyn Harder, PA-C        Past Medical History:  Diagnosis Date  . Brain aneurysm   . Difficult intubation   . Hypertension   . Non-small cell cancer of right lung ([place]) 09/22/2017   Chemo + Rad tx's.   . Viral  meningitis     Past Surgical History:  Procedure Laterality Date  . ENDOBRONCHIAL ULTRASOUND N/A 09/09/2017   Procedure: ENDOBRONCHIAL ULTRASOUND;  Surgeon: Laverle Hobby, MD;  Location: [facility];  Service: Pulmonary;  Laterality: N/A;  . OOPHORECTOMY Left   . PORTA CATH INSERTION N/A 09/29/2017   Procedure: PORTA CATH INSERTION;  Surgeon: Algernon Huxley, MD;  Location: [facility];  Service: Cardiovascular;  Laterality: N/A;  . surgical repair for brain tumor  1969   clips in head  . WISDOM TOOTH EXTRACTION      Social History Social History   Tobacco Use  . Smoking status: Former Smoker    Packs/day: 0.25    Last attempt to quit: 10/03/2017    Years since quitting: 0.2  . Smokeless tobacco: Never Used  Substance Use Topics  . Alcohol use: Not Currently    Comment: social  . Drug use: Not Currently    Types: Cocaine    Family History Family History  Problem Relation Age of Onset  . Diabetes Mother   . Hypertension Mother   . Hyperlipidemia Mother   . Arthritis Mother   . Hypertension Father   . Hyperlipidemia Father   . Heart attack Father        x2  .  Breast cancer Neg Hx     No Known Allergies   REVIEW OF SYSTEMS (Negative unless checked)  Constitutional: [] Weight loss  [] Fever  [] Chills Cardiac: [] Chest pain   [] Chest pressure   [] Palpitations   [] Shortness of breath when laying flat   [] Shortness of breath at rest   [] Shortness of breath with exertion. Vascular:  [] Pain in legs with walking   [] Pain in legs at rest   [] Pain in legs when laying flat   [] Claudication   [] Pain in feet when walking  [] Pain in feet at rest  [] Pain in feet when laying flat   [] History of DVT   [] Phlebitis   [] Swelling in legs   [] Varicose veins   [] Non-healing ulcers Pulmonary:   [] Uses home oxygen   [x] Productive cough   [] Hemoptysis   [] Wheeze  [] COPD   [] Asthma Neurologic:  [] Dizziness  [] Blackouts   [] Seizures   [] History of stroke   [] History of TIA  [] Aphasia    [] Temporary blindness   [] Dysphagia   [] Weakness or numbness in arms   [] Weakness or numbness in legs Musculoskeletal:  [] Arthritis   [] Joint swelling   [] Joint pain   [] Low back pain Hematologic:  [] Easy bruising  [] Easy bleeding   [] Hypercoagulable state   [x] Anemic  [] Hepatitis Gastrointestinal:  [] Blood in stool   [] Vomiting blood  [x] Gastroesophageal reflux/heartburn   [] Difficulty swallowing. Genitourinary:  [] Chronic kidney disease   [] Difficult urination  [] Frequent urination  [] Burning with urination   [] Blood in urine Skin:  [] Rashes   [] Ulcers   [] Wounds Psychological:  [] History of anxiety   []  History of major depression.  Physical Examination  There were no vitals filed for this visit. There is no height or weight on file to calculate BMI. Gen: WD/WN, NAD Head: [place]/AT, No temporalis wasting.  Ear/Nose/Throat: Hearing grossly intact, nares w/o erythema or drainage, oropharynx w/o Erythema/Exudate,  Eyes: Conjunctiva clear, sclera non-icteric Neck: Trachea midline.  No JVD.  Pulmonary:  Good air movement, respirations not labored, no use of accessory muscles.  Cardiac: RRR, normal S1, S2. Vascular: port present in right subclavicular location Vessel Right Left  Radial Palpable Palpable                                    Musculoskeletal: M/S 5/5 throughout.  Extremities without ischemic changes.  No deformity or atrophy.  Neurologic: Sensation grossly intact in extremities.  Symmetrical.  Speech is fluent. Motor exam as listed above. Psychiatric: Judgment intact, Mood & affect appropriate for pt's clinical situation. Dermatologic: No rashes or ulcers noted.  No cellulitis or open wounds.      CBC Lab Results  Component Value Date   WBC 4.4 12/27/2017   HGB 11.2 (L) 12/27/2017   HCT 32.6 (L) 12/27/2017   MCV 100.5 (H) 12/27/2017   PLT 391 12/27/2017    BMET    Component Value Date/Time   NA 136 12/27/2017 0848   NA 138 02/04/2013 1657   K 4.1  12/27/2017 0848   K 3.6 02/04/2013 1657   CL 103 12/27/2017 0848   CL 107 02/04/2013 1657   CO2 24 12/27/2017 0848   CO2 27 02/04/2013 1657   GLUCOSE 194 (H) 12/27/2017 0848   GLUCOSE 103 (H) 02/04/2013 1657   BUN 7 (L) 12/27/2017 0848   BUN 12 02/04/2013 1657   CREATININE 0.57 12/27/2017 0848   CREATININE 0.82 02/04/2013 1657   CALCIUM 8.7 (  L) 12/27/2017 0848   CALCIUM 8.9 02/04/2013 1657   GFRNONAA >60 12/27/2017 0848   GFRNONAA >60 02/04/2013 1657   GFRAA >60 12/27/2017 0848   GFRAA >60 02/04/2013 1657   Estimated Creatinine Clearance: 73.5 mL/min (by C-G formula based on SCr of 0.57 mg/dL).  COAG No results found for: INR, PROTIME  Radiology Ct Chest W Contrast  Result Date: 12/20/2017 CLINICAL DATA:  Non-small-cell lung cancer EXAM: CT CHEST WITH CONTRAST TECHNIQUE: Multidetector CT imaging of the chest was performed during intravenous contrast administration. CONTRAST:  88mL ISOVUE-300 IOPAMIDOL (ISOVUE-300) INJECTION 61% COMPARISON:  PET-CT 08/09/2017.  Chest CT 05/27/2017. FINDINGS: Cardiovascular: The heart size is normal. No substantial pericardial effusion. Atherosclerotic calcification is noted in the wall of the thoracic aorta. Right Port-A-Cath tip is non central. The catheter tracks cranially along the posterior wall of the right internal jugular vein. Axial and sagittal imaging raises the question of the catheter being extraluminal although the appearance is almost assuredly projectional given that the operative note from 09/29/2017 confirmed placement of the catheter tip at the "cavoatrial junction". There is a adherent thrombus in the lumen of the right internal jugular vein at the level of the venotomy. Mediastinum/Nodes: Interval decrease in mediastinal lymphadenopathy. 8 mm short axis right paratracheal lymph node on today's study was 16 mm when remeasured in the same dimension on the previous PET-CT. There is no hilar lymphadenopathy. The esophagus has normal imaging  features. There is no axillary lymphadenopathy. Lungs/Pleura: Hyperexpansion is consistent with emphysema. Biapical pleuroparenchymal scarring evident. Apparent volume loss central right upper lobe associated with a new 8 mm right upper lobe pulmonary nodule (3:46). No pulmonary edema or pleural effusion. Upper Abdomen: Unremarkable. Musculoskeletal: No worrisome lytic or sclerotic osseous abnormality. IMPRESSION: 1. Interval decrease in the enlarged hypermetabolic right paratracheal lymph nodes seen on the previous PET-CT. No other lymphadenopathy in the chest. 2. 8 mm ill-defined nodule central right upper lobe, indeterminate. Attention on follow-up recommended. 3. Right Port-A-Cath tip is non central and tracks cranially in the right internal jugular vein although the tip is above the cranial most aspect of the CT scan. Axial and sagittal imaging raises the question of extraluminal catheter placement, but surgical report from 09/29/2017 confirmed tip placement at the "caval atrial junction". As such, appearance today likely reflects interval migration of the catheter tip into the neck. Given that there is some associated adherent thrombus in the inferior right internal jugular vein, vascular surgery or interventional radiology consultation may be warranted. Findings were called by me at the time of interpretation on 12/20/2017 at 1:41 pm to Dr. Earlie Server , who verbally acknowledged these results. Electronically Signed   By: Misty Stanley M.D.   On: 12/20/2017 13:42    Assessment/Plan 1.  Malpositioned right jugular Port-A-Cath with thrombus in the right jugular vein.  This will need to be revised/removed and replaced in a different location.  Venous thrombectomy may be possible and we may be able to salvage this access site.  For revision or replacement today 2.  Lung cancer.  This is the reason the port is in place for treatment of her lung cancer.  Ongoing treatment continues 3.  Hypertension.  Stable on  outpatient medications and blood pressure control important in reducing the progression of atherosclerotic disease. On appropriate oral medications.    Leotis Pain, MD  01/03/2018 1:49 PM

## 2018-01-04 ENCOUNTER — Encounter: Payer: Self-pay | Admitting: Vascular Surgery

## 2018-01-10 ENCOUNTER — Inpatient Hospital Stay: Payer: Medicare HMO | Attending: Hematology and Oncology | Admitting: Oncology

## 2018-01-10 ENCOUNTER — Inpatient Hospital Stay: Payer: Medicare HMO

## 2018-01-10 ENCOUNTER — Other Ambulatory Visit: Payer: Self-pay

## 2018-01-10 ENCOUNTER — Encounter: Payer: Self-pay | Admitting: Oncology

## 2018-01-10 VITALS — BP 129/75 | HR 82 | Wt 192.5 lb

## 2018-01-10 DIAGNOSIS — Z79899 Other long term (current) drug therapy: Secondary | ICD-10-CM | POA: Insufficient documentation

## 2018-01-10 DIAGNOSIS — C3491 Malignant neoplasm of unspecified part of right bronchus or lung: Secondary | ICD-10-CM | POA: Diagnosis not present

## 2018-01-10 DIAGNOSIS — Z87891 Personal history of nicotine dependence: Secondary | ICD-10-CM | POA: Diagnosis not present

## 2018-01-10 DIAGNOSIS — D7589 Other specified diseases of blood and blood-forming organs: Secondary | ICD-10-CM | POA: Diagnosis not present

## 2018-01-10 DIAGNOSIS — Z5112 Encounter for antineoplastic immunotherapy: Secondary | ICD-10-CM

## 2018-01-10 DIAGNOSIS — R05 Cough: Secondary | ICD-10-CM | POA: Diagnosis not present

## 2018-01-10 DIAGNOSIS — R5383 Other fatigue: Secondary | ICD-10-CM | POA: Diagnosis not present

## 2018-01-10 DIAGNOSIS — J984 Other disorders of lung: Secondary | ICD-10-CM | POA: Diagnosis not present

## 2018-01-10 LAB — CBC WITH DIFFERENTIAL/PLATELET
BASOS ABS: 0.1 10*3/uL (ref 0–0.1)
Basophils Relative: 1 %
Eosinophils Absolute: 0.4 10*3/uL (ref 0–0.7)
Eosinophils Relative: 11 %
HEMATOCRIT: 34.5 % — AB (ref 35.0–47.0)
HEMOGLOBIN: 12 g/dL (ref 12.0–16.0)
LYMPHS PCT: 26 %
Lymphs Abs: 1 10*3/uL (ref 1.0–3.6)
MCH: 34.5 pg — ABNORMAL HIGH (ref 26.0–34.0)
MCHC: 34.8 g/dL (ref 32.0–36.0)
MCV: 99.3 fL (ref 80.0–100.0)
Monocytes Absolute: 0.3 10*3/uL (ref 0.2–0.9)
Monocytes Relative: 8 %
NEUTROS ABS: 2.1 10*3/uL (ref 1.4–6.5)
NEUTROS PCT: 54 %
Platelets: 308 10*3/uL (ref 150–440)
RBC: 3.48 MIL/uL — AB (ref 3.80–5.20)
RDW: 13.6 % (ref 11.5–14.5)
WBC: 4 10*3/uL (ref 3.6–11.0)

## 2018-01-10 LAB — COMPREHENSIVE METABOLIC PANEL
ALT: 16 U/L (ref 0–44)
ANION GAP: 9 (ref 5–15)
AST: 25 U/L (ref 15–41)
Albumin: 3.7 g/dL (ref 3.5–5.0)
Alkaline Phosphatase: 107 U/L (ref 38–126)
BUN: 7 mg/dL — ABNORMAL LOW (ref 8–23)
CO2: 24 mmol/L (ref 22–32)
Calcium: 9 mg/dL (ref 8.9–10.3)
Chloride: 104 mmol/L (ref 98–111)
Creatinine, Ser: 0.48 mg/dL (ref 0.44–1.00)
Glucose, Bld: 208 mg/dL — ABNORMAL HIGH (ref 70–99)
Potassium: 3.8 mmol/L (ref 3.5–5.1)
Sodium: 137 mmol/L (ref 135–145)
Total Bilirubin: 0.5 mg/dL (ref 0.3–1.2)
Total Protein: 7.3 g/dL (ref 6.5–8.1)

## 2018-01-10 LAB — VITAMIN B12: Vitamin B-12: 2674 pg/mL — ABNORMAL HIGH (ref 180–914)

## 2018-01-10 LAB — TSH: TSH: 1.638 u[IU]/mL (ref 0.350–4.500)

## 2018-01-10 MED ORDER — HEPARIN SOD (PORK) LOCK FLUSH 100 UNIT/ML IV SOLN
500.0000 [IU] | Freq: Once | INTRAVENOUS | Status: DC
Start: 2018-01-10 — End: 2018-01-10

## 2018-01-10 MED ORDER — DURVALUMAB 500 MG/10ML IV SOLN
10.0000 mg/kg | Freq: Once | INTRAVENOUS | Status: AC
  Administered 2018-01-10: 860 mg via INTRAVENOUS
  Filled 2018-01-10: qty 10

## 2018-01-10 MED ORDER — HEPARIN SOD (PORK) LOCK FLUSH 100 UNIT/ML IV SOLN
500.0000 [IU] | Freq: Once | INTRAVENOUS | Status: AC | PRN
  Administered 2018-01-10: 500 [IU]

## 2018-01-10 MED ORDER — SODIUM CHLORIDE 0.9% FLUSH
10.0000 mL | Freq: Once | INTRAVENOUS | Status: AC
Start: 2018-01-10 — End: 2018-01-10
  Administered 2018-01-10: 10 mL via INTRAVENOUS
  Filled 2018-01-10: qty 10

## 2018-01-10 MED ORDER — SODIUM CHLORIDE 0.9 % IV SOLN
Freq: Once | INTRAVENOUS | Status: AC
  Administered 2018-01-10: 10:00:00 via INTRAVENOUS
  Filled 2018-01-10: qty 1000

## 2018-01-10 NOTE — Progress Notes (Signed)
Patient here today for follow up.   

## 2018-01-10 NOTE — Progress Notes (Signed)
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   HISTORY OF PRESENTING ILLNESS:  Connie Powers is a  67 y.o.  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  CT lung cancer screen 05/27/2017   1. A few bilateral pulmonary nodules measuring up to 5 mm are indeterminate. Considering the suspicious 2.0 cm mediastinal adenopathy, follow up with PET/CT is recommended. 2. Mild bronchial wall thickening could represent airway disease including bronchitis.ACR Lung-RADS Category and Recommendation*: ACR Lung-RADS Category 2S (S modifier for mediastinal adenopathy)  PET scan 3/112019 1. No hypermetabolic pulmonary nodules.2. Hypermetabolic enlarged right paratracheal lymph node, of uncertain etiology in isolation. Lymphoproliferative disorder cannot be excluded. 3.  Aortic atherosclerosis (ICD10-170.0).  EBUS biopsy of paratracheal node showed non small cell lung cancer favoring adenocarcinoma. Case was discussed on tumor board on 09/16/2017 and consensus recommendation is to treat as Stage IIIA disease given the invasion of mediastinum.   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 MRI brain is contraindicated. CT brain negative for metastatic disease.    INTERVAL HISTORY Connie Powers is a 68 y.o. female who has above history reviewed by me today presents for maintenance immunotherapy for lung cancer treatment. During the interval patient also was referred to vascular surgery for evaluation of malposition of Medi port.  Patient's right jugular port was removed and right internal jugular vein Mediport was placed by Dr.Dew on  8/5 2019.  # Cough, chronic cough stable.  No shortness of breath.  Appetite remains good. #Fatigue: Stable not better or worse.  Current Treatment S/p concurrent Chemotherapy [name] /taxol  Weekly x 6] and RT.  Started on maintenance durvalumab on 12/27/2017.  Review of Systems  Constitutional: Positive for malaise/fatigue. Negative for chills, fever and weight loss.  HENT: Negative for congestion, ear discharge, ear pain, hearing loss, nosebleeds, sinus pain and sore throat.   Eyes: Negative for double vision, photophobia, pain, discharge and redness.  Respiratory: Positive for cough. Negative for hemoptysis, sputum production, shortness of breath and wheezing.   Cardiovascular: Negative for chest pain, palpitations, orthopnea, claudication and leg swelling.  Gastrointestinal: Negative for abdominal pain, blood in stool, constipation, diarrhea, heartburn, melena, nausea and vomiting.  Genitourinary: Negative for dysuria, flank pain, frequency and hematuria.  Musculoskeletal: Negative for back pain, myalgias and neck pain.  Skin: Negative for itching and rash.  Neurological: Negative for dizziness, tingling, tremors, sensory change, focal weakness, weakness and headaches.  Endo/Heme/Allergies: Negative for environmental allergies. Does not bruise/bleed easily.  Psychiatric/Behavioral: Negative for depression, hallucinations and substance abuse. The patient is not nervous/anxious.     MEDICAL HISTORY:  Past Medical History:  Diagnosis Date  . Brain aneurysm   . Difficult intubation   . Hypertension   . Non-small cell cancer of right lung ([place]) 09/22/2017   Chemo + Rad tx's.   . Viral meningitis     SURGICAL HISTORY: Past Surgical History:  Procedure Laterality Date  . ENDOBRONCHIAL ULTRASOUND N/A 09/09/2017   Procedure: ENDOBRONCHIAL ULTRASOUND;  Surgeon: Laverle Hobby, MD;  Location: [facility];  Service: Pulmonary;  Laterality: N/A;  . OOPHORECTOMY Left   . PORTA CATH  INSERTION N/A 09/29/2017  Procedure: PORTA CATH INSERTION;  Surgeon: Algernon Huxley, MD;  Location: [facility];  Service: Cardiovascular;  Laterality: N/A;  . PORTA CATH INSERTION N/A 01/03/2018   Procedure: PORTA CATH INSERTION;  Surgeon: Algernon Huxley, MD;  Location: [facility];  Service: Cardiovascular;  Laterality: N/A;  . surgical repair for brain tumor  1969   clips in head  . WISDOM TOOTH EXTRACTION      SOCIAL HISTORY: Social History   Socioeconomic History  . Marital status: Married    Spouse name: Not on file  . Number of children: Not on file  . Years of education: Not on file  . Highest education level: Not on file  Occupational History  . Not on file  Social Needs  . Financial resource strain: Not on file  . Food insecurity:    Worry: Not on file    Inability: Not on file  . Transportation needs:    Medical: Not on file    Non-medical: Not on file  Tobacco Use  . Smoking status: Former Smoker    Packs/day: 0.25    Last attempt to quit: 10/03/2017    Years since quitting: 0.2  . Smokeless tobacco: Never Used  Substance and Sexual Activity  . Alcohol use: Not Currently    Comment: social  . Drug use: Not Currently    Types: Cocaine  . Sexual activity: Not on file  Lifestyle  . Physical activity:    Days per week: Not on file    Minutes per session: Not on file  . Stress: Not on file  Relationships  . Social connections:    Talks on phone: Not on file    Gets together: Not on file    Attends religious service: Not on file    Active member of club or organization: Not on file    Attends meetings of clubs or organizations: Not on file    Relationship status: Not on file  . Intimate partner violence:    Fear of current or ex partner: Not on file    Emotionally abused: Not on file    Physically abused: Not on file    Forced sexual activity: Not on file  Other Topics Concern  . Not on file  Social History Narrative  . Not on file     FAMILY HISTORY: Family History  Problem Relation Age of Onset  . Diabetes Mother   . Hypertension Mother   . Hyperlipidemia Mother   . Arthritis Mother   . Hypertension Father   . Hyperlipidemia Father   . Heart attack Father        x2  . Breast cancer Neg Hx     ALLERGIES:  has No Known Allergies.  MEDICATIONS:  Current Outpatient Medications  Medication Sig Dispense Refill  . amLODipine (NORVASC) 10 MG tablet Take 10 mg by mouth daily.  11  . aspirin EC 81 MG tablet Take 81 mg by mouth daily.    . Cyanocobalamin (B-12) 1000 MCG CAPS Take 1,000 mcg by mouth daily. 30 capsule 3  . lidocaine-prilocaine (EMLA) cream Apply to affected area once 30 g 3  . ondansetron (ZOFRAN) 8 MG tablet Take 1 tablet (8 mg total) by mouth 2 (two) times daily as needed for refractory nausea / vomiting. Start on day 3 after chemo. 30 tablet 1  . prochlorperazine (COMPAZINE) 10 MG tablet Take 1 tablet (10 mg total) by mouth every 6 (six) hours as needed (Nausea  or vomiting). 30 tablet 1  . umeclidinium-vilanterol (ANORO ELLIPTA) 62.5-25 MCG/INH AEPB Inhale 1 puff into the lungs daily as needed (shortness of breath).      No current facility-administered medications for this visit.    Facility-Administered Medications Ordered in Other Visits  Medication Dose Route Frequency Provider Last Rate Last Dose  . 0.9 %  sodium chloride infusion   Intravenous Once Earlie Server, MD      . durvalumab [facility]) 860 mg in sodium chloride 0.9 % 100 mL chemo infusion  10 mg/kg (Treatment Plan Recorded) Intravenous Once Earlie Server, MD      . heparin lock flush 100 unit/mL  500 Units Intravenous Once Earlie Server, MD      . heparin lock flush 100 unit/mL  500 Units Intracatheter Once PRN Earlie Server, MD         PHYSICAL EXAMINATION: ECOG PERFORMANCE STATUS: 0 - Asymptomatic Vitals:   01/10/18 0902  BP: 129/75  Pulse: 82   Filed Weights   01/10/18 0902  Weight: 192 lb 8 oz (87.3 kg)    Physical Exam   Constitutional: She is oriented to person, place, and time. She appears well-developed and well-nourished. No distress.  HENT:  Head: Normocephalic and atraumatic.  Right Ear: External ear normal.  Left Ear: External ear normal.  Mouth/Throat: Oropharynx is clear and moist.  Eyes: Pupils are equal, round, and reactive to light. Conjunctivae and EOM are normal. Left eye exhibits no discharge. No scleral icterus.  Neck: Normal range of motion. Neck supple.  Cardiovascular: Normal rate, regular rhythm and normal heart sounds.  No murmur heard. Pulmonary/Chest: Effort normal. No respiratory distress. She has no wheezes. She has no rales. She exhibits no tenderness.  Decreased breath sound bilaterally.   Abdominal: Soft. Bowel sounds are normal. She exhibits no distension and no mass. There is no tenderness. There is no guarding.  Musculoskeletal: Normal range of motion. She exhibits no edema or deformity.  Lymphadenopathy:    She has no cervical adenopathy.  Neurological: She is alert and oriented to person, place, and time. No cranial nerve deficit. Coordination normal.  Skin: Skin is warm and dry. No rash noted. No erythema.  Psychiatric: She has a normal mood and affect. Her behavior is normal. Thought content normal.     LABORATORY DATA:  I have reviewed the data as listed Lab Results  Component Value Date   WBC 4.0 01/10/2018   HGB 12.0 01/10/2018   HCT 34.5 (L) 01/10/2018   MCV 99.3 01/10/2018   PLT 308 01/10/2018   Recent Labs    11/23/17 1054 12/27/17 0848 01/10/18 0834  NA 133* 136 137  K 4.0 4.1 3.8  CL 102 103 104  CO2 24 24 24   GLUCOSE 110* 194* 208*  BUN 6* 7* 7*  CREATININE 0.36* 0.57 0.48  CALCIUM 9.1 8.7* 9.0  GFRNONAA >60 >60 >60  GFRAA >60 >60 >60  PROT 7.4 7.3 7.3  ALBUMIN 3.7 3.5 3.7  AST 24 23 25   ALT 32 15 16  ALKPHOS 115 105 107  BILITOT 0.5 0.3 0.5    RADIOGRAPHIC STUDIES: I have personally reviewed the radiological images as listed and  agreed with the findings in the report.  12/20/2017 Repeat CT scan showed interval decreased of disease. CT images were Independently reviewed by me and discussed with patient. New 56mm right lung lesion, indeterminate.   ASSESSMENT & PLAN:  Cancer Staging Non-small cell cancer of right lung ([place]) Staging form: Lung, AJCC 8th  Edition - Clinical stage from 09/22/2017: Stage Unknown (cTX, cN1, cM0) - Signed by Earlie Server, MD on 09/22/2017 - Pathologic: No stage assigned - Unsigned  1. Non-small cell cancer of right lung ([place])   2. Encounter for antineoplastic immunotherapy    #Clinical stage IIIA lung adenocarcinoma Labs reviewed and discussed with patient.  Counts acceptable to proceed with today's durvalumab treatment.  Patient can follow-up in 2 weeks for assessment for next cycle of durvalumab maintenance treatment.  She appears tolerating immunotherapy so far.  We discussed about signs of potential immunotherapy side effects.  Patient voices understanding and will keep an eye on these signs.  She knows to call for an interval if any concern or questions.   # For the new 8 mm lung lesion, non specific finding.  Will repeat CT scan in 2 months. # Macrocytosis with anemia with borderline B12 levels, will give her 1 dose of parenteral B12 injection.  Continue oral B12 supplementation.  Check vitamin B12 level today.  All questions were answered. The patient knows to call the clinic with any problems questions or concerns. No orders of the defined types were placed in this encounter.   Return of visit: 2 weeks for assessment prior to next cycle of immunotherapy.  Total face to face encounter time for this patient visit was 25 min. >50% of the time was  spent in counseling and coordination of care.    Earlie Server, MD, PhD Hematology Oncology [facility] Pager- [phone] 01/10/2018

## 2018-01-24 ENCOUNTER — Inpatient Hospital Stay (HOSPITAL_BASED_OUTPATIENT_CLINIC_OR_DEPARTMENT_OTHER): Payer: Medicare HMO | Admitting: Oncology

## 2018-01-24 ENCOUNTER — Other Ambulatory Visit: Payer: Self-pay

## 2018-01-24 ENCOUNTER — Encounter: Payer: Self-pay | Admitting: Oncology

## 2018-01-24 ENCOUNTER — Inpatient Hospital Stay: Payer: Medicare HMO

## 2018-01-24 VITALS — BP 131/89 | HR 89 | Temp 96.8°F | Resp 18 | Wt 192.7 lb

## 2018-01-24 DIAGNOSIS — Z87891 Personal history of nicotine dependence: Secondary | ICD-10-CM

## 2018-01-24 DIAGNOSIS — C3491 Malignant neoplasm of unspecified part of right bronchus or lung: Secondary | ICD-10-CM

## 2018-01-24 DIAGNOSIS — R05 Cough: Secondary | ICD-10-CM

## 2018-01-24 DIAGNOSIS — D7589 Other specified diseases of blood and blood-forming organs: Secondary | ICD-10-CM | POA: Diagnosis not present

## 2018-01-24 DIAGNOSIS — Z5112 Encounter for antineoplastic immunotherapy: Secondary | ICD-10-CM

## 2018-01-24 DIAGNOSIS — J984 Other disorders of lung: Secondary | ICD-10-CM | POA: Diagnosis not present

## 2018-01-24 LAB — COMPREHENSIVE METABOLIC PANEL
ALT: 17 U/L (ref 0–44)
AST: 20 U/L (ref 15–41)
Albumin: 3.7 g/dL (ref 3.5–5.0)
Alkaline Phosphatase: 101 U/L (ref 38–126)
Anion gap: 9 (ref 5–15)
BUN: 6 mg/dL — ABNORMAL LOW (ref 8–23)
CHLORIDE: 104 mmol/L (ref 98–111)
CO2: 24 mmol/L (ref 22–32)
Calcium: 9.3 mg/dL (ref 8.9–10.3)
Creatinine, Ser: 0.55 mg/dL (ref 0.44–1.00)
GFR calc Af Amer: >60 mL/min (ref >60)
Glucose, Bld: 140 mg/dL — ABNORMAL HIGH (ref 70–99)
POTASSIUM: 3.9 mmol/L (ref 3.5–5.1)
SODIUM: 137 mmol/L (ref 135–145)
Total Bilirubin: 0.6 mg/dL (ref 0.3–1.2)
Total Protein: 7.3 g/dL (ref 6.5–8.1)

## 2018-01-24 LAB — CBC WITH DIFFERENTIAL/PLATELET
Basophils Absolute: 0.1 10*3/uL (ref 0–0.1)
Basophils Relative: 1 %
Eosinophils Absolute: 0.4 10*3/uL (ref 0–0.7)
Eosinophils Relative: 8 %
HEMATOCRIT: 35.8 % (ref 35.0–47.0)
HEMOGLOBIN: 12.3 g/dL (ref 12.0–16.0)
LYMPHS ABS: 1 10*3/uL (ref 1.0–3.6)
LYMPHS PCT: 17 %
MCH: 34.1 pg — AB (ref 26.0–34.0)
MCHC: 34.4 g/dL (ref 32.0–36.0)
MCV: 99.2 fL (ref 80.0–100.0)
MONOS PCT: 8 %
Monocytes Absolute: 0.4 10*3/uL (ref 0.2–0.9)
NEUTROS PCT: 66 %
Neutro Abs: 3.8 10*3/uL (ref 1.4–6.5)
Platelets: 318 10*3/uL (ref 150–440)
RBC: 3.61 MIL/uL — AB (ref 3.80–5.20)
RDW: 13.6 % (ref 11.5–14.5)
WBC: 5.7 10*3/uL (ref 3.6–11.0)

## 2018-01-24 LAB — TSH: TSH: 1.053 u[IU]/mL (ref 0.350–4.500)

## 2018-01-24 MED ORDER — SODIUM CHLORIDE 0.9% FLUSH
10.0000 mL | INTRAVENOUS | Status: DC | PRN
  Filled 2018-01-24: qty 10

## 2018-01-24 MED ORDER — SODIUM CHLORIDE 0.9 % IV SOLN
10.0000 mg/kg | Freq: Once | INTRAVENOUS | Status: AC
  Administered 2018-01-24: 860 mg via INTRAVENOUS
  Filled 2018-01-24: qty 10

## 2018-01-24 MED ORDER — HEPARIN SOD (PORK) LOCK FLUSH 100 UNIT/ML IV SOLN
500.0000 [IU] | Freq: Once | INTRAVENOUS | Status: AC | PRN
  Administered 2018-01-24: 500 [IU]
  Filled 2018-01-24: qty 5

## 2018-01-24 MED ORDER — SODIUM CHLORIDE 0.9 % IV SOLN
Freq: Once | INTRAVENOUS | Status: AC
  Administered 2018-01-24: 12:00:00 via INTRAVENOUS
  Filled 2018-01-24: qty 250

## 2018-01-26 NOTE — Progress Notes (Signed)
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   HISTORY OF PRESENTING ILLNESS:  Connie Powers is a  67 y.o.  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  CT lung cancer screen 05/27/2017   1. A few bilateral pulmonary nodules measuring up to 5 mm are indeterminate. Considering the suspicious 2.0 cm mediastinal adenopathy, follow up with PET/CT is recommended. 2. Mild bronchial wall thickening could represent airway disease including bronchitis.ACR Lung-RADS Category and Recommendation*: ACR Lung-RADS Category 2S (S modifier for mediastinal adenopathy)  PET scan 3/112019 1. No hypermetabolic pulmonary nodules.2. Hypermetabolic enlarged right paratracheal lymph node, of uncertain etiology in isolation. Lymphoproliferative disorder cannot be excluded. 3.  Aortic atherosclerosis (ICD10-170.0).  EBUS biopsy of paratracheal node showed non small cell lung cancer favoring adenocarcinoma. Case was discussed on tumor board on 09/16/2017 and consensus recommendation is to treat as Stage IIIA disease given the invasion of mediastinum.   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 MRI brain is contraindicated. CT brain negative for metastatic disease.  During the interval patient also was referred to vascular surgery for evaluation of malposition of Medi port.  Patient's right jugular port was removed and right internal jugular vein Mediport was placed by Dr.Dew on 8/5 2019.  INTERVAL HISTORY Connie Powers is a 67 y.o. female who has above history reviewed by me today presents for maintenance immunotherapy for lung cancer  treatment.   # Cough, chronic, no change.  Otherwise doing well. Good appetite. Weight stable.   Current Treatment S/p concurrent Chemotherapy [name] /taxol  Weekly x 6] and RT.  Started on maintenance durvalumab on 12/27/2017.  Review of Systems  Constitutional: Positive for malaise/fatigue. Negative for chills, fever and weight loss.  HENT: Negative for congestion, ear discharge, ear pain, hearing loss, nosebleeds, sinus pain and sore throat.   Eyes: Negative for double vision, photophobia, pain, discharge and redness.  Respiratory: Positive for cough. Negative for hemoptysis, sputum production, shortness of breath and wheezing.   Cardiovascular: Negative for chest pain, palpitations, orthopnea, claudication and leg swelling.  Gastrointestinal: Negative for abdominal pain, blood in stool, constipation, diarrhea, heartburn, melena, nausea and vomiting.  Genitourinary: Negative for dysuria, flank pain, frequency and hematuria.  Musculoskeletal: Negative for back pain, myalgias and neck pain.  Skin: Negative for itching and rash.  Neurological: Negative for dizziness, tingling, tremors, sensory change, focal weakness, weakness and headaches.  Endo/Heme/Allergies: Negative for environmental allergies. Does not bruise/bleed easily.  Psychiatric/Behavioral: Negative for depression, hallucinations and substance abuse. The patient is not nervous/anxious.     MEDICAL HISTORY:  Past Medical History:  Diagnosis Date  . Brain aneurysm   . Difficult intubation   . Hypertension   . Non-small cell cancer of right lung ([place]) 09/22/2017   Chemo + Rad tx's.   . Viral meningitis     SURGICAL HISTORY: Past Surgical History:  Procedure Laterality Date  . ENDOBRONCHIAL ULTRASOUND N/A 09/09/2017   Procedure: ENDOBRONCHIAL ULTRASOUND;  Surgeon: Laverle Hobby, MD;  Location: [facility] ORS;  Service: Pulmonary;  Laterality: N/A;  . OOPHORECTOMY Left   . PORTA CATH INSERTION N/A 09/29/2017    Procedure: PORTA CATH INSERTION;  Surgeon: Algernon Huxley, MD;  Location: [place] [facility];  Service: Cardiovascular;  Laterality: N/A;  . PORTA CATH INSERTION N/A 01/03/2018   Procedure: PORTA CATH INSERTION;  Surgeon: Algernon Huxley, MD;  Location: [place] [facility];  Service: Cardiovascular;  Laterality: N/A;  . surgical repair for brain tumor  1969   clips in head  . WISDOM TOOTH EXTRACTION      SOCIAL HISTORY: Social History   Socioeconomic History  . Marital status: Married    Spouse name: Not on file  . Number of children: Not on file  . Years of education: Not on file  . Highest education level: Not on file  Occupational History  . Not on file  Social Needs  . Financial resource strain: Not on file  . Food insecurity:    Worry: Not on file    Inability: Not on file  . Transportation needs:    Medical: Not on file    Non-medical: Not on file  Tobacco Use  . Smoking status: Former Smoker    Packs/day: 0.25    Last attempt to quit: 10/03/2017    Years since quitting: 0.3  . Smokeless tobacco: Never Used  Substance and Sexual Activity  . Alcohol use: Not Currently    Comment: social  . Drug use: Not Currently    Types: Cocaine  . Sexual activity: Not on file  Lifestyle  . Physical activity:    Days per week: Not on file    Minutes per session: Not on file  . Stress: Not on file  Relationships  . Social connections:    Talks on phone: Not on file    Gets together: Not on file    Attends religious service: Not on file    Active member of club or organization: Not on file    Attends meetings of clubs or organizations: Not on file    Relationship status: Not on file  . Intimate partner violence:    Fear of current or ex partner: Not on file    Emotionally abused: Not on file    Physically abused: Not on file    Forced sexual activity: Not on file  Other Topics Concern  . Not on file  Social History Narrative  . Not on file    FAMILY HISTORY: Family  History  Problem Relation Age of Onset  . Diabetes Mother   . Hypertension Mother   . Hyperlipidemia Mother   . Arthritis Mother   . Hypertension Father   . Hyperlipidemia Father   . Heart attack Father        x2  . Breast cancer Neg Hx     ALLERGIES:  has No Known Allergies.  MEDICATIONS:  Current Outpatient Medications  Medication Sig Dispense Refill  . amLODipine (NORVASC) 10 MG tablet Take 10 mg by mouth daily.  11  . aspirin EC 81 MG tablet Take 81 mg by mouth daily.    . Cyanocobalamin (B-12) 1000 MCG CAPS Take 1,000 mcg by mouth daily. 30 capsule 3  . lidocaine-prilocaine (EMLA) cream Apply to affected area once 30 g 3  . ondansetron (ZOFRAN) 8 MG tablet Take 1 tablet (8 mg total) by mouth 2 (two) times daily as needed for refractory nausea / vomiting. Start on day 3 after chemo. 30 tablet 1  . prochlorperazine (COMPAZINE) 10 MG tablet Take 1 tablet (10 mg total) by mouth every 6 (six) hours as needed (Nausea or vomiting). 30 tablet 1  .  umeclidinium-vilanterol (ANORO ELLIPTA) 62.5-25 MCG/INH AEPB Inhale 1 puff into the lungs daily as needed (shortness of breath).      No current facility-administered medications for this visit.      PHYSICAL EXAMINATION: ECOG PERFORMANCE STATUS: 0 - Asymptomatic Vitals:   01/24/18 1039  BP: 131/89  Pulse: 89  Resp: 18  Temp: (!) 96.8 F (36 C)  SpO2: 99%   Filed Weights   01/24/18 1039  Weight: 192 lb 11.2 oz (87.4 kg)    Physical Exam  Constitutional: She is oriented to person, place, and time. She appears well-developed and well-nourished. No distress.  HENT:  Head: Normocephalic and atraumatic.  Right Ear: External ear normal.  Left Ear: External ear normal.  Mouth/Throat: Oropharynx is clear and moist.  Eyes: Pupils are equal, round, and reactive to light. Conjunctivae and EOM are normal. Left eye exhibits no discharge. No scleral icterus.  Neck: Normal range of motion. Neck supple.  Cardiovascular: Normal rate,  regular rhythm and normal heart sounds.  No murmur heard. Pulmonary/Chest: Effort normal. No respiratory distress. She has no wheezes. She has no rales. She exhibits no tenderness.  Decreased breath sound bilaterally.   Abdominal: Soft. Bowel sounds are normal. She exhibits no distension and no mass. There is no tenderness. There is no guarding.  Musculoskeletal: Normal range of motion. She exhibits no edema or deformity.  Lymphadenopathy:    She has no cervical adenopathy.  Neurological: She is alert and oriented to person, place, and time. No cranial nerve deficit. Coordination normal.  Skin: Skin is warm and dry. No rash noted. No erythema.  Psychiatric: She has a normal mood and affect. Her behavior is normal. Thought content normal.     LABORATORY DATA:  I have reviewed the data as listed Lab Results  Component Value Date   WBC 5.7 01/24/2018   HGB 12.3 01/24/2018   HCT 35.8 01/24/2018   MCV 99.2 01/24/2018   PLT 318 01/24/2018   Recent Labs    12/27/17 0848 01/10/18 0834 01/24/18 1026  NA 136 137 137  K 4.1 3.8 3.9  CL 103 104 104  CO2 24 24 24   GLUCOSE 194* 208* 140*  BUN 7* 7* 6*  CREATININE 0.57 0.48 0.55  CALCIUM 8.7* 9.0 9.3  GFRNONAA >60 >60 >60  GFRAA >60 >60 >60  PROT 7.3 7.3 7.3  ALBUMIN 3.5 3.7 3.7  AST 23 25 20   ALT 15 16 17   ALKPHOS 105 107 101  BILITOT 0.3 0.5 0.6    RADIOGRAPHIC STUDIES: I have personally reviewed the radiological images as listed and agreed with the findings in the report.  12/20/2017 Repeat CT scan showed interval decreased of disease. CT images were Independently reviewed by me and discussed with patient. New 49mm right lung lesion, indeterminate.   ASSESSMENT & PLAN:  Cancer Staging Non-small cell cancer of right lung [facility]) Staging form: Lung, AJCC 8th Edition - Clinical stage from 09/22/2017: Stage Unknown (cTX, cN1, cM0) - Signed by Earlie Server, MD on 09/22/2017 - Pathologic: No stage assigned - Unsigned  1. Non-small cell  cancer of right lung ([place])   2. Macrocytosis without anemia   3. Encounter for antineoplastic immunotherapy    #Clinical stage IIIA lung adenocarcinomaLabs reviewed and discussed with patient.  Counts are acceptable to proceed with today's durvalumab treatment.  Appears tolerating immunotherapy well.  # For the new 8 mm lung lesion, nonspecific finding.  Repeat CT in 2 months..   # Macrocytosis, B12 level is elevated  in 2600s. continue oral B12 supplementation.  Macrocytosis improved after B12 is replete.    All questions were answered. The patient knows to call the clinic with any problems questions or concerns.  Return visits: 2 weeks for assessment prior to next cycle of immunotherapy.  Total face to face encounter time for this patient visit was 25 min. >50% of the time was  spent in counseling and coordination of care.    Earlie Server, MD, PhD Hematology Oncology [facility] at [facility] Pager- [phone] 01/26/2018

## 2018-02-07 ENCOUNTER — Inpatient Hospital Stay (HOSPITAL_BASED_OUTPATIENT_CLINIC_OR_DEPARTMENT_OTHER): Payer: Medicare HMO | Admitting: Oncology

## 2018-02-07 ENCOUNTER — Encounter: Payer: Self-pay | Admitting: Oncology

## 2018-02-07 ENCOUNTER — Other Ambulatory Visit: Payer: Self-pay

## 2018-02-07 ENCOUNTER — Inpatient Hospital Stay: Payer: Medicare HMO | Attending: Oncology

## 2018-02-07 ENCOUNTER — Inpatient Hospital Stay: Payer: Medicare HMO

## 2018-02-07 VITALS — BP 145/82 | HR 93 | Temp 98.1°F | Resp 18 | Wt 193.8 lb

## 2018-02-07 DIAGNOSIS — R05 Cough: Secondary | ICD-10-CM | POA: Insufficient documentation

## 2018-02-07 DIAGNOSIS — C3491 Malignant neoplasm of unspecified part of right bronchus or lung: Secondary | ICD-10-CM | POA: Diagnosis present

## 2018-02-07 DIAGNOSIS — D7589 Other specified diseases of blood and blood-forming organs: Secondary | ICD-10-CM | POA: Diagnosis not present

## 2018-02-07 DIAGNOSIS — Z720 Tobacco use: Secondary | ICD-10-CM | POA: Insufficient documentation

## 2018-02-07 DIAGNOSIS — J984 Other disorders of lung: Secondary | ICD-10-CM

## 2018-02-07 DIAGNOSIS — Z79899 Other long term (current) drug therapy: Secondary | ICD-10-CM | POA: Diagnosis not present

## 2018-02-07 DIAGNOSIS — Z5112 Encounter for antineoplastic immunotherapy: Secondary | ICD-10-CM | POA: Insufficient documentation

## 2018-02-07 DIAGNOSIS — Z87891 Personal history of nicotine dependence: Secondary | ICD-10-CM

## 2018-02-07 LAB — COMPREHENSIVE METABOLIC PANEL
ALBUMIN: 4 g/dL (ref 3.5–5.0)
ALT: 20 U/L (ref 0–44)
AST: 24 U/L (ref 15–41)
Alkaline Phosphatase: 102 U/L (ref 38–126)
Anion gap: 7 (ref 5–15)
BUN: 6 mg/dL — AB (ref 8–23)
CO2: 25 mmol/L (ref 22–32)
Calcium: 9.2 mg/dL (ref 8.9–10.3)
Chloride: 103 mmol/L (ref 98–111)
Creatinine, Ser: 0.58 mg/dL (ref 0.44–1.00)
GFR calc Af Amer: >60 mL/min (ref >60)
Glucose, Bld: 176 mg/dL — ABNORMAL HIGH (ref 70–99)
POTASSIUM: 3.7 mmol/L (ref 3.5–5.1)
Sodium: 135 mmol/L (ref 135–145)
Total Bilirubin: 0.6 mg/dL (ref 0.3–1.2)
Total Protein: 7.4 g/dL (ref 6.5–8.1)

## 2018-02-07 LAB — CBC WITH DIFFERENTIAL/PLATELET
Basophils Absolute: 0 10*3/uL (ref 0–0.1)
Basophils Relative: 1 %
EOS PCT: 5 %
Eosinophils Absolute: 0.3 10*3/uL (ref 0–0.7)
HCT: 36.9 % (ref 35.0–47.0)
HEMOGLOBIN: 12.6 g/dL (ref 12.0–16.0)
LYMPHS PCT: 13 %
Lymphs Abs: 0.9 10*3/uL — ABNORMAL LOW (ref 1.0–3.6)
MCH: 33.6 pg (ref 26.0–34.0)
MCHC: 34.1 g/dL (ref 32.0–36.0)
MCV: 98.4 fL (ref 80.0–100.0)
MONOS PCT: 5 %
Monocytes Absolute: 0.3 10*3/uL (ref 0.2–0.9)
NEUTROS PCT: 76 %
Neutro Abs: 5.2 10*3/uL (ref 1.4–6.5)
Platelets: 313 10*3/uL (ref 150–440)
RBC: 3.75 MIL/uL — AB (ref 3.80–5.20)
RDW: 13.9 % (ref 11.5–14.5)
WBC: 6.8 10*3/uL (ref 3.6–11.0)

## 2018-02-07 LAB — TSH: TSH: 1.132 u[IU]/mL (ref 0.350–4.500)

## 2018-02-07 MED ORDER — SODIUM CHLORIDE 0.9 % IV SOLN
Freq: Once | INTRAVENOUS | Status: AC
  Administered 2018-02-07: 11:00:00 via INTRAVENOUS
  Filled 2018-02-07: qty 250

## 2018-02-07 MED ORDER — SODIUM CHLORIDE 0.9% FLUSH
10.0000 mL | Freq: Once | INTRAVENOUS | Status: AC
  Administered 2018-02-07: 10 mL via INTRAVENOUS
  Filled 2018-02-07: qty 10

## 2018-02-07 MED ORDER — HEPARIN SOD (PORK) LOCK FLUSH 100 UNIT/ML IV SOLN
500.0000 [IU] | Freq: Once | INTRAVENOUS | Status: AC
  Administered 2018-02-07: 500 [IU] via INTRAVENOUS
  Filled 2018-02-07: qty 5

## 2018-02-07 MED ORDER — SODIUM CHLORIDE 0.9 % IV SOLN
10.0000 mg/kg | Freq: Once | INTRAVENOUS | Status: AC
  Administered 2018-02-07: 860 mg via INTRAVENOUS
  Filled 2018-02-07: qty 10

## 2018-02-07 NOTE — Progress Notes (Signed)
Patient here for follow up. No concerns voiced.  °

## 2018-02-07 NOTE — Progress Notes (Signed)
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   HISTORY OF PRESENTING ILLNESS:  Connie Powers is a  67 y.o.  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  CT lung cancer screen 05/27/2017   1. A few bilateral pulmonary nodules measuring up to 5 mm are indeterminate. Considering the suspicious 2.0 cm mediastinal adenopathy, follow up with PET/CT is recommended. 2. Mild bronchial wall thickening could represent airway disease including bronchitis.ACR Lung-RADS Category and Recommendation*: ACR Lung-RADS Category 2S (S modifier for mediastinal adenopathy)  PET scan 3/112019 1. No hypermetabolic pulmonary nodules.2. Hypermetabolic enlarged right paratracheal lymph node, of uncertain etiology in isolation. Lymphoproliferative disorder cannot be excluded. 3.  Aortic atherosclerosis (ICD10-170.0).  EBUS biopsy of paratracheal node showed non small cell lung cancer favoring adenocarcinoma. Case was discussed on tumor board on 09/16/2017 and consensus recommendation is to treat as Stage IIIA disease given the invasion of mediastinum.   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 MRI brain is contraindicated. CT brain negative for metastatic disease.  During the interval patient also was referred to vascular surgery for evaluation of malposition of Medi port.  Patient's right jugular port was removed and right internal jugular vein Mediport was placed by Dr.Dew on 8/5 2019.  INTERVAL HISTORY Connie Powers is a 67 y.o. female who has above history reviewed by me today presents for maintenance immunotherapy for lung cancer  treatment.   # Cough, chronic, no changes. Stable.  Otherwise no new compliant.  Denies any skin rash, diarrhea, abdominal pain.   Current Treatment S/p concurrent Chemotherapy [name] /taxol  Weekly x 6] and RT.  Started on maintenance durvalumab on 12/27/2017.  Review of Systems  Constitutional: Positive for malaise/fatigue. Negative for chills, fever and weight loss.  HENT: Negative for congestion, ear discharge, ear pain, hearing loss, nosebleeds, sinus pain and sore throat.   Eyes: Negative for double vision, photophobia, pain, discharge and redness.  Respiratory: Positive for cough. Negative for hemoptysis, sputum production, shortness of breath and wheezing.   Cardiovascular: Negative for chest pain, palpitations, orthopnea, claudication and leg swelling.  Gastrointestinal: Negative for abdominal pain, blood in stool, constipation, diarrhea, heartburn, melena, nausea and vomiting.  Genitourinary: Negative for dysuria, flank pain, frequency and hematuria.  Musculoskeletal: Negative for back pain, myalgias and neck pain.  Skin: Negative for itching and rash.  Neurological: Negative for dizziness, tingling, tremors, sensory change, focal weakness, weakness and headaches.  Endo/Heme/Allergies: Negative for environmental allergies. Does not bruise/bleed easily.  Psychiatric/Behavioral: Negative for depression, hallucinations and substance abuse. The patient is not nervous/anxious.     MEDICAL HISTORY:  Past Medical History:  Diagnosis Date  . Brain aneurysm   . Difficult intubation   . Hypertension   . Non-small cell cancer of right lung ([place]) 09/22/2017   Chemo + Rad tx's.   . Viral meningitis     SURGICAL HISTORY: Past Surgical History:  Procedure Laterality Date  . ENDOBRONCHIAL ULTRASOUND N/A 09/09/2017   Procedure: ENDOBRONCHIAL ULTRASOUND;  Surgeon: Laverle Hobby, MD;  Location: [facility] ORS;  Service: Pulmonary;  Laterality: N/A;  . OOPHORECTOMY Left   . PORTA CATH  INSERTION N/A 09/29/2017  Procedure: PORTA CATH INSERTION;  Surgeon: Algernon Huxley, MD;  Location: [place] [facility];  Service: Cardiovascular;  Laterality: N/A;  . PORTA CATH INSERTION N/A 01/03/2018   Procedure: PORTA CATH INSERTION;  Surgeon: Algernon Huxley, MD;  Location: [place] [facility];  Service: Cardiovascular;  Laterality: N/A;  . surgical repair for brain tumor  1969   clips in head  . WISDOM TOOTH EXTRACTION      SOCIAL HISTORY: Social History   Socioeconomic History  . Marital status: Married    Spouse name: Not on file  . Number of children: Not on file  . Years of education: Not on file  . Highest education level: Not on file  Occupational History  . Not on file  Social Needs  . Financial resource strain: Not on file  . Food insecurity:    Worry: Not on file    Inability: Not on file  . Transportation needs:    Medical: Not on file    Non-medical: Not on file  Tobacco Use  . Smoking status: Former Smoker    Packs/day: 0.25    Last attempt to quit: 10/03/2017    Years since quitting: 0.3  . Smokeless tobacco: Never Used  Substance and Sexual Activity  . Alcohol use: Not Currently    Comment: social  . Drug use: Not Currently    Types: Cocaine  . Sexual activity: Not on file  Lifestyle  . Physical activity:    Days per week: Not on file    Minutes per session: Not on file  . Stress: Not on file  Relationships  . Social connections:    Talks on phone: Not on file    Gets together: Not on file    Attends religious service: Not on file    Active member of club or organization: Not on file    Attends meetings of clubs or organizations: Not on file    Relationship status: Not on file  . Intimate partner violence:    Fear of current or ex partner: Not on file    Emotionally abused: Not on file    Physically abused: Not on file    Forced sexual activity: Not on file  Other Topics Concern  . Not on file  Social History Narrative  . Not on file     FAMILY HISTORY: Family History  Problem Relation Age of Onset  . Diabetes Mother   . Hypertension Mother   . Hyperlipidemia Mother   . Arthritis Mother   . Hypertension Father   . Hyperlipidemia Father   . Heart attack Father        x2  . Breast cancer Neg Hx     ALLERGIES:  has No Known Allergies.  MEDICATIONS:  Current Outpatient Medications  Medication Sig Dispense Refill  . amLODipine (NORVASC) 10 MG tablet Take 10 mg by mouth daily.  11  . aspirin EC 81 MG tablet Take 81 mg by mouth daily.    . Cyanocobalamin (B-12) 1000 MCG CAPS Take 1,000 mcg by mouth daily. 30 capsule 3  . docusate sodium (COLACE) 100 MG capsule Take 100 mg by mouth daily.    Marland Kitchen lidocaine-prilocaine (EMLA) cream Apply to affected area once 30 g 3  . ondansetron (ZOFRAN) 8 MG tablet Take 1 tablet (8 mg total) by mouth 2 (two) times daily as needed for refractory nausea / vomiting. Start on day 3 after chemo. 30 tablet 1  . prochlorperazine (COMPAZINE) 10 MG  tablet Take 1 tablet (10 mg total) by mouth every 6 (six) hours as needed (Nausea or vomiting). 30 tablet 1  . umeclidinium-vilanterol (ANORO ELLIPTA) 62.5-25 MCG/INH AEPB Inhale 1 puff into the lungs daily as needed (shortness of breath).      No current facility-administered medications for this visit.      PHYSICAL EXAMINATION: ECOG PERFORMANCE STATUS: 0 - Asymptomatic Vitals:   02/07/18 1031  BP: (!) 145/82  Pulse: 93  Resp: 18  Temp: 98.1 F (36.7 C)  SpO2: 99%   Filed Weights   02/07/18 1031  Weight: 193 lb 12.8 oz (87.9 kg)    Physical Exam  Constitutional: She is oriented to person, place, and time. She appears well-nourished. No distress.  HENT:  Head: Normocephalic and atraumatic.  Right Ear: External ear normal.  Left Ear: External ear normal.  Mouth/Throat: Oropharynx is clear and moist.  Eyes: Pupils are equal, round, and reactive to light. Conjunctivae and EOM are normal. Left eye exhibits no discharge. No scleral  icterus.  Neck: Normal range of motion. Neck supple.  Cardiovascular: Normal rate, regular rhythm and normal heart sounds.  No murmur heard. Pulmonary/Chest: Effort normal. No respiratory distress. She has no wheezes. She has no rales. She exhibits no tenderness.  Decreased breath sound bilaterally.  Left lower lung rhonchi.   Abdominal: Soft. Bowel sounds are normal. She exhibits no distension and no mass. There is no tenderness. There is no guarding.  Musculoskeletal: Normal range of motion. She exhibits no edema or deformity.  Lymphadenopathy:    She has no cervical adenopathy.  Neurological: She is alert and oriented to person, place, and time. No cranial nerve deficit. Coordination normal.  Skin: Skin is warm and dry. No rash noted. No erythema.  Psychiatric: She has a normal mood and affect. Her behavior is normal. Thought content normal.     LABORATORY DATA:  I have reviewed the data as listed Lab Results  Component Value Date   WBC 6.8 02/07/2018   HGB 12.6 02/07/2018   HCT 36.9 02/07/2018   MCV 98.4 02/07/2018   PLT 313 02/07/2018   Recent Labs    01/10/18 0834 01/24/18 1026 02/07/18 1015  NA 137 137 135  K 3.8 3.9 3.7  CL 104 104 103  CO2 24 24 25   GLUCOSE 208* 140* 176*  BUN 7* 6* 6*  CREATININE 0.48 0.55 0.58  CALCIUM 9.0 9.3 9.2  GFRNONAA >60 >60 >60  GFRAA >60 >60 >60  PROT 7.3 7.3 7.4  ALBUMIN 3.7 3.7 4.0  AST 25 20 24   ALT 16 17 20   ALKPHOS 107 101 102  BILITOT 0.5 0.6 0.6    RADIOGRAPHIC STUDIES: I have personally reviewed the radiological images as listed and agreed with the findings in the report.  12/20/2017 Repeat CT scan showed interval decreased of disease. CT images were Independently reviewed by me and discussed with patient. New 37mm right lung lesion, indeterminate.   ASSESSMENT & PLAN:  Cancer Staging Non-small cell cancer of right lung [facility]) Staging form: Lung, AJCC 8th Edition - Clinical stage from 09/22/2017: Stage Unknown (cTX,  cN1, cM0) - Signed by Earlie Server, MD on 09/22/2017 - Pathologic: No stage assigned - Unsigned  1. Non-small cell cancer of right lung ([place])   2. Macrocytosis without anemia   3. Encounter for antineoplastic immunotherapy    #Clinical stage IIIA lung adenocarcinoma Labs reviewed and discussed with patient. Counts are acceptable to proceed with today's durvalumab treatment.  Appears immunotherapy well.   #  For the new 8 mm lung lesion, nonspecific finding. Repeat CT chest in October.   # Macrocytosis, continue oral B12 supplementation.     All questions were answered. The patient knows to call the clinic with any problems questions or concerns. Return visits: 2 weeks.  Total face to face encounter time for this patient visit was 25 min. >50% of the time was  spent in counseling and coordination of care.     Earlie Server, MD, PhD Hematology Oncology [facility] at [facility] Pager- [phone] 02/07/2018

## 2018-02-18 ENCOUNTER — Other Ambulatory Visit: Payer: Self-pay | Admitting: *Deleted

## 2018-02-18 ENCOUNTER — Other Ambulatory Visit: Payer: Self-pay | Admitting: Oncology

## 2018-02-18 ENCOUNTER — Ambulatory Visit
Admission: RE | Admit: 2018-02-18 | Discharge: 2018-02-18 | Disposition: A | Discharge status: Home / Self Care | Payer: Medicare HMO | Source: Ambulatory Visit | Attending: Oncology | Admitting: Oncology

## 2018-02-18 DIAGNOSIS — R059 Cough, unspecified: Secondary | ICD-10-CM

## 2018-02-18 DIAGNOSIS — C3491 Malignant neoplasm of unspecified part of right bronchus or lung: Secondary | ICD-10-CM

## 2018-02-18 DIAGNOSIS — R05 Cough: Secondary | ICD-10-CM

## 2018-02-21 ENCOUNTER — Other Ambulatory Visit: Payer: Self-pay

## 2018-02-21 ENCOUNTER — Inpatient Hospital Stay: Payer: Medicare HMO

## 2018-02-21 ENCOUNTER — Inpatient Hospital Stay (HOSPITAL_BASED_OUTPATIENT_CLINIC_OR_DEPARTMENT_OTHER): Payer: Medicare HMO | Admitting: Oncology

## 2018-02-21 ENCOUNTER — Encounter: Payer: Self-pay | Admitting: Oncology

## 2018-02-21 VITALS — BP 142/78 | HR 84 | Temp 97.5°F | Resp 18 | Wt 196.4 lb

## 2018-02-21 DIAGNOSIS — Z720 Tobacco use: Secondary | ICD-10-CM | POA: Diagnosis not present

## 2018-02-21 DIAGNOSIS — D7589 Other specified diseases of blood and blood-forming organs: Secondary | ICD-10-CM | POA: Diagnosis not present

## 2018-02-21 DIAGNOSIS — R05 Cough: Secondary | ICD-10-CM | POA: Diagnosis not present

## 2018-02-21 DIAGNOSIS — Z5112 Encounter for antineoplastic immunotherapy: Secondary | ICD-10-CM | POA: Diagnosis not present

## 2018-02-21 DIAGNOSIS — R059 Cough, unspecified: Secondary | ICD-10-CM

## 2018-02-21 DIAGNOSIS — C3491 Malignant neoplasm of unspecified part of right bronchus or lung: Secondary | ICD-10-CM

## 2018-02-21 LAB — CBC WITH DIFFERENTIAL/PLATELET
BASOS PCT: 1 %
Basophils Absolute: 0.1 10*3/uL (ref 0–0.1)
EOS ABS: 0.4 10*3/uL (ref 0–0.7)
EOS PCT: 7 %
HCT: 37.2 % (ref 35.0–47.0)
Hemoglobin: 12.8 g/dL (ref 12.0–16.0)
Lymphocytes Relative: 21 %
Lymphs Abs: 1.2 10*3/uL (ref 1.0–3.6)
MCH: 34.2 pg — ABNORMAL HIGH (ref 26.0–34.0)
MCHC: 34.5 g/dL (ref 32.0–36.0)
MCV: 99 fL (ref 80.0–100.0)
MONO ABS: 0.4 10*3/uL (ref 0.2–0.9)
MONOS PCT: 7 %
Neutro Abs: 3.6 10*3/uL (ref 1.4–6.5)
Neutrophils Relative %: 64 %
PLATELETS: 326 10*3/uL (ref 150–440)
RBC: 3.75 MIL/uL — AB (ref 3.80–5.20)
RDW: 14.2 % (ref 11.5–14.5)
WBC: 5.6 10*3/uL (ref 3.6–11.0)

## 2018-02-21 LAB — COMPREHENSIVE METABOLIC PANEL
ALK PHOS: 103 U/L (ref 38–126)
ALT: 20 U/L (ref 0–44)
AST: 26 U/L (ref 15–41)
Albumin: 3.9 g/dL (ref 3.5–5.0)
Anion gap: 8 (ref 5–15)
BUN: 5 mg/dL — ABNORMAL LOW (ref 8–23)
CALCIUM: 9 mg/dL (ref 8.9–10.3)
CO2: 24 mmol/L (ref 22–32)
CREATININE: 0.62 mg/dL (ref 0.44–1.00)
Chloride: 105 mmol/L (ref 98–111)
GFR calc non Af Amer: >60 mL/min (ref >60)
GLUCOSE: 157 mg/dL — AB (ref 70–99)
Potassium: 4 mmol/L (ref 3.5–5.1)
SODIUM: 137 mmol/L (ref 135–145)
Total Bilirubin: 0.5 mg/dL (ref 0.3–1.2)
Total Protein: 7.1 g/dL (ref 6.5–8.1)

## 2018-02-21 LAB — TSH: TSH: 1.401 u[IU]/mL (ref 0.350–4.500)

## 2018-02-21 MED ORDER — HEPARIN SOD (PORK) LOCK FLUSH 100 UNIT/ML IV SOLN
500.0000 [IU] | Freq: Once | INTRAVENOUS | Status: AC
  Administered 2018-02-21: 500 [IU] via INTRAVENOUS
  Filled 2018-02-21: qty 5

## 2018-02-21 MED ORDER — SODIUM CHLORIDE 0.9% FLUSH
10.0000 mL | Freq: Once | INTRAVENOUS | Status: AC
  Administered 2018-02-21: 10 mL via INTRAVENOUS
  Filled 2018-02-21: qty 10

## 2018-02-21 MED ORDER — SODIUM CHLORIDE 0.9 % IV SOLN
Freq: Once | INTRAVENOUS | Status: AC
  Administered 2018-02-21: 11:00:00 via INTRAVENOUS
  Filled 2018-02-21: qty 250

## 2018-02-21 MED ORDER — SODIUM CHLORIDE 0.9 % IV SOLN
10.0000 mg/kg | Freq: Once | INTRAVENOUS | Status: AC
  Administered 2018-02-21: 860 mg via INTRAVENOUS
  Filled 2018-02-21: qty 10

## 2018-02-21 NOTE — Progress Notes (Addendum)
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   HISTORY OF PRESENTING ILLNESS:  Connie Powers is a  67 y.o.  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  CT lung cancer screen 05/27/2017   1. A few bilateral pulmonary nodules measuring up to 5 mm are indeterminate. Considering the suspicious 2.0 cm mediastinal adenopathy, follow up with PET/CT is recommended. 2. Mild bronchial wall thickening could represent airway disease including bronchitis.ACR Lung-RADS Category and Recommendation*: ACR Lung-RADS Category 2S (S modifier for mediastinal adenopathy)  PET scan 3/112019 1. No hypermetabolic pulmonary nodules.2. Hypermetabolic enlarged right paratracheal lymph node, of uncertain etiology in isolation. Lymphoproliferative disorder cannot be excluded. 3.  Aortic atherosclerosis (ICD10-170.0).  EBUS biopsy of paratracheal node showed non small cell lung cancer favoring adenocarcinoma. Case was discussed on tumor board on 09/16/2017 and consensus recommendation is to treat as Stage IIIA disease given the invasion of mediastinum.   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 MRI brain is contraindicated. CT brain negative for metastatic disease.  During the interval patient also was referred to vascular surgery for evaluation of malposition of Medi port.  Patient's right jugular port was removed and right internal jugular vein Mediport was placed by Dr.Dew on 8/5 2019.  INTERVAL HISTORY Connie Powers is a 67 y.o. female who has above history reviewed by me today presents from assessment prior to maintenance  immunotherapy for lung cancer treatment. Patient reports doing well. # Cough, chronic, no changes.  Stable.   #She continues to smoke cigarettes occasionally.  Otherwise no new compliant.  Denies any skin rash, diarrhea, abdominal pain.  Current Treatment S/p concurrent Chemotherapy [name] /taxol  Weekly x 6] and RT.  Started on maintenance durvalumab on 12/27/2017.  Review of Systems  Constitutional: Positive for malaise/fatigue. Negative for chills, fever and weight loss.  HENT: Negative for congestion, ear discharge, ear pain, hearing loss, nosebleeds, sinus pain and sore throat.   Eyes: Negative for double vision, photophobia, pain, discharge and redness.  Respiratory: Positive for cough. Negative for hemoptysis, sputum production, shortness of breath and wheezing.   Cardiovascular: Negative for chest pain, palpitations, orthopnea, claudication and leg swelling.  Gastrointestinal: Negative for abdominal pain, blood in stool, constipation, diarrhea, heartburn, melena, nausea and vomiting.  Genitourinary: Negative for dysuria, flank pain, frequency and hematuria.  Musculoskeletal: Negative for back pain, myalgias and neck pain.  Skin: Negative for itching and rash.  Neurological: Negative for dizziness, tingling, tremors, sensory change, focal weakness, weakness and headaches.  Endo/Heme/Allergies: Negative for environmental allergies. Does not bruise/bleed easily.  Psychiatric/Behavioral: Negative for depression, hallucinations and substance abuse. The patient is not nervous/anxious.     MEDICAL HISTORY:  Past Medical History:  Diagnosis Date  . Brain aneurysm   . Difficult intubation   . Hypertension   . Non-small cell cancer of right lung ([place]) 09/22/2017   Chemo + Rad tx's.   . Viral meningitis     SURGICAL HISTORY: Past Surgical History:  Procedure Laterality Date  . ENDOBRONCHIAL ULTRASOUND N/A 09/09/2017   Procedure: ENDOBRONCHIAL ULTRASOUND;  Surgeon: Laverle Hobby, MD;  Location: [facility] ORS;  Service: Pulmonary;  Laterality: N/A;  .  OOPHORECTOMY Left   . PORTA CATH INSERTION N/A 09/29/2017   Procedure: PORTA CATH INSERTION;  Surgeon: Algernon Huxley, MD;  Location: [place] [facility];  Service: Cardiovascular;  Laterality: N/A;  . PORTA CATH INSERTION N/A 01/03/2018   Procedure: PORTA CATH INSERTION;  Surgeon: Algernon Huxley, MD;  Location: [place] [facility];  Service: Cardiovascular;  Laterality: N/A;  . surgical repair for brain tumor  1969   clips in head  . WISDOM TOOTH EXTRACTION      SOCIAL HISTORY: Social History   Socioeconomic History  . Marital status: Married    Spouse name: Not on file  . Number of children: Not on file  . Years of education: Not on file  . Highest education level: Not on file  Occupational History  . Not on file  Social Needs  . Financial resource strain: Not on file  . Food insecurity:    Worry: Not on file    Inability: Not on file  . Transportation needs:    Medical: Not on file    Non-medical: Not on file  Tobacco Use  . Smoking status: Former Smoker    Packs/day: 0.25    Last attempt to quit: 10/03/2017    Years since quitting: 0.3  . Smokeless tobacco: Never Used  Substance and Sexual Activity  . Alcohol use: Not Currently    Comment: social  . Drug use: Not Currently    Types: Cocaine  . Sexual activity: Not on file  Lifestyle  . Physical activity:    Days per week: Not on file    Minutes per session: Not on file  . Stress: Not on file  Relationships  . Social connections:    Talks on phone: Not on file    Gets together: Not on file    Attends religious service: Not on file    Active member of club or organization: Not on file    Attends meetings of clubs or organizations: Not on file    Relationship status: Not on file  . Intimate partner violence:    Fear of current or ex partner: Not on file    Emotionally abused: Not on file    Physically abused: Not on file    Forced sexual  activity: Not on file  Other Topics Concern  . Not on file  Social History Narrative  . Not on file    FAMILY HISTORY: Family History  Problem Relation Age of Onset  . Diabetes Mother   . Hypertension Mother   . Hyperlipidemia Mother   . Arthritis Mother   . Hypertension Father   . Hyperlipidemia Father   . Heart attack Father        x2  . Breast cancer Neg Hx     ALLERGIES:  has No Known Allergies.  MEDICATIONS:  Current Outpatient Medications  Medication Sig Dispense Refill  . amLODipine (NORVASC) 10 MG tablet Take 10 mg by mouth daily.  11  . aspirin EC 81 MG tablet Take 81 mg by mouth daily.    . Cyanocobalamin (B-12) 1000 MCG CAPS Take 1,000 mcg by mouth daily. 30 capsule 3  . docusate sodium (COLACE) 100 MG capsule Take 100 mg by mouth daily.    Marland Kitchen lidocaine-prilocaine (EMLA) cream Apply to affected area once 30 g 3  . ondansetron (ZOFRAN) 8 MG tablet Take 1 tablet (8 mg total) by mouth 2 (two) times daily as needed for refractory nausea / vomiting. Start on day  3 after chemo. 30 tablet 1  . prochlorperazine (COMPAZINE) 10 MG tablet Take 1 tablet (10 mg total) by mouth every 6 (six) hours as needed (Nausea or vomiting). 30 tablet 1  . umeclidinium-vilanterol (ANORO ELLIPTA) 62.5-25 MCG/INH AEPB Inhale 1 puff into the lungs daily as needed (shortness of breath).      No current facility-administered medications for this visit.      PHYSICAL EXAMINATION: ECOG PERFORMANCE STATUS: 0 - Asymptomatic Vitals:   02/21/18 1022  BP: (!) 142/78  Pulse: 84  Resp: 18  Temp: (!) 97.5 F (36.4 C)  SpO2: 94%   Filed Weights   02/21/18 1022  Weight: 196 lb 6.4 oz (89.1 kg)    Physical Exam  Constitutional: She is oriented to person, place, and time. She appears well-nourished. No distress.  HENT:  Head: Normocephalic and atraumatic.  Right Ear: External ear normal.  Left Ear: External ear normal.  Mouth/Throat: Oropharynx is clear and moist.  Eyes: Pupils are equal,  round, and reactive to light. Conjunctivae and EOM are normal. Left eye exhibits no discharge. No scleral icterus.  Neck: Normal range of motion. Neck supple.  Cardiovascular: Normal rate, regular rhythm and normal heart sounds.  No murmur heard. Pulmonary/Chest: Effort normal. No respiratory distress. She has no wheezes. She has no rales. She exhibits no tenderness.  Decreased breath sound bilaterally.  Left lower lung rhonchi .  Has resolved.  Abdominal: Soft. Bowel sounds are normal. She exhibits no distension and no mass. There is no tenderness. There is no guarding.  Musculoskeletal: Normal range of motion. She exhibits no edema or deformity.  Lymphadenopathy:    She has no cervical adenopathy.  Neurological: She is alert and oriented to person, place, and time. No cranial nerve deficit. Coordination normal.  Skin: Skin is warm and dry. No rash noted. No erythema.  Psychiatric: She has a normal mood and affect. Her behavior is normal. Thought content normal.     LABORATORY DATA:  I have reviewed the data as listed Lab Results  Component Value Date   WBC 5.6 02/21/2018   HGB 12.8 02/21/2018   HCT 37.2 02/21/2018   MCV 99.0 02/21/2018   PLT 326 02/21/2018   Recent Labs    01/24/18 1026 02/07/18 1015 02/21/18 1009  NA 137 135 137  K 3.9 3.7 4.0  CL 104 103 105  CO2 24 25 24   GLUCOSE 140* 176* 157*  BUN 6* 6* 5*  CREATININE 0.55 0.58 0.62  CALCIUM 9.3 9.2 9.0  GFRNONAA >60 >60 >60  GFRAA >60 >60 >60  PROT 7.3 7.4 7.1  ALBUMIN 3.7 4.0 3.9  AST 20 24 26   ALT 17 20 20   ALKPHOS 101 102 103  BILITOT 0.6 0.6 0.5    RADIOGRAPHIC STUDIES: I have personally reviewed the radiological images as listed and agreed with the findings in the report.  12/20/2017 Repeat CT scan showed interval decreased of disease. CT images were Independently reviewed by me and discussed with patient. New 37mm right lung lesion, indeterminate.   ASSESSMENT & PLAN:  Cancer Staging Non-small cell  cancer of right lung [facility]) Staging form: Lung, AJCC 8th Edition - Clinical stage from 09/22/2017: Stage Unknown (cTX, cN1, cM0) - Signed by Earlie Server, MD on 09/22/2017 - Pathologic: No stage assigned - Unsigned  1. Non-small cell cancer of right lung ([place])   2. Encounter for antineoplastic immunotherapy   3. Cough   4. Macrocytosis without anemia    #Clinical stage IIIA lung  adenocarcinoma Labs reviewed and discussed with patient.  Counts acceptable to proceed with today's durvalumab treatment.  No signs of immunotherapy toxicities.  # For the new 8 mm lung lesion, nonspecific finding.  Ordered CT chest without as follow-up. # Cough: CXR was independently reviewed and discussed with patient. No acute changes.  # Macrocytosis, stable.  Continue oral B12 supplementation.     All questions were answered. The patient knows to call the clinic with any problems questions or concerns. Return visits: 2 weeks.   Total face to face encounter time for this patient visit was 25 min. >50% of the time was  spent in counseling and coordination of care.    Earlie Server, MD, PhD Hematology Oncology [facility] Pager- [phone] 02/21/2018

## 2018-02-21 NOTE — Progress Notes (Signed)
Patient here for follow up. No concerns voiced.  °

## 2018-02-25 ENCOUNTER — Ambulatory Visit
Admission: RE | Admit: 2018-02-25 | Discharge: 2018-02-25 | Disposition: A | Discharge status: Home / Self Care | Payer: Medicare HMO | Source: Ambulatory Visit | Attending: Oncology | Admitting: Oncology

## 2018-02-25 DIAGNOSIS — J439 Emphysema, unspecified: Secondary | ICD-10-CM | POA: Diagnosis not present

## 2018-02-25 DIAGNOSIS — C3491 Malignant neoplasm of unspecified part of right bronchus or lung: Secondary | ICD-10-CM | POA: Insufficient documentation

## 2018-02-25 DIAGNOSIS — I700 Atherosclerosis of aorta: Secondary | ICD-10-CM | POA: Insufficient documentation

## 2018-02-25 MED ORDER — IOHEXOL 300 MG/ML  SOLN
75.0000 mL | Freq: Once | INTRAMUSCULAR | Status: AC | PRN
  Administered 2018-02-25: 75 mL via INTRAVENOUS

## 2018-03-07 ENCOUNTER — Inpatient Hospital Stay (HOSPITAL_BASED_OUTPATIENT_CLINIC_OR_DEPARTMENT_OTHER): Payer: Medicare HMO | Admitting: Oncology

## 2018-03-07 ENCOUNTER — Inpatient Hospital Stay: Payer: Medicare HMO

## 2018-03-07 ENCOUNTER — Inpatient Hospital Stay: Payer: Medicare HMO | Attending: Oncology

## 2018-03-07 ENCOUNTER — Other Ambulatory Visit: Payer: Self-pay

## 2018-03-07 ENCOUNTER — Encounter: Payer: Self-pay | Admitting: Oncology

## 2018-03-07 VITALS — BP 135/82 | HR 85 | Temp 97.4°F | Resp 18 | Wt 196.4 lb

## 2018-03-07 DIAGNOSIS — Z720 Tobacco use: Secondary | ICD-10-CM

## 2018-03-07 DIAGNOSIS — D7589 Other specified diseases of blood and blood-forming organs: Secondary | ICD-10-CM

## 2018-03-07 DIAGNOSIS — Z5112 Encounter for antineoplastic immunotherapy: Secondary | ICD-10-CM

## 2018-03-07 DIAGNOSIS — C3491 Malignant neoplasm of unspecified part of right bronchus or lung: Secondary | ICD-10-CM

## 2018-03-07 DIAGNOSIS — R05 Cough: Secondary | ICD-10-CM

## 2018-03-07 DIAGNOSIS — Z79899 Other long term (current) drug therapy: Secondary | ICD-10-CM | POA: Insufficient documentation

## 2018-03-07 LAB — COMPREHENSIVE METABOLIC PANEL
ALBUMIN: 3.9 g/dL (ref 3.5–5.0)
ALK PHOS: 106 U/L (ref 38–126)
ALT: 19 U/L (ref 0–44)
AST: 23 U/L (ref 15–41)
Anion gap: 8 (ref 5–15)
BUN: 6 mg/dL — ABNORMAL LOW (ref 8–23)
CALCIUM: 9.1 mg/dL (ref 8.9–10.3)
CHLORIDE: 105 mmol/L (ref 98–111)
CO2: 25 mmol/L (ref 22–32)
CREATININE: 0.55 mg/dL (ref 0.44–1.00)
GFR calc non Af Amer: >60 mL/min (ref >60)
GLUCOSE: 168 mg/dL — AB (ref 70–99)
Potassium: 3.7 mmol/L (ref 3.5–5.1)
SODIUM: 138 mmol/L (ref 135–145)
Total Bilirubin: 0.7 mg/dL (ref 0.3–1.2)
Total Protein: 7.3 g/dL (ref 6.5–8.1)

## 2018-03-07 LAB — CBC WITH DIFFERENTIAL/PLATELET
Basophils Absolute: 0.1 10*3/uL (ref 0–0.1)
Basophils Relative: 1 %
EOS ABS: 0.4 10*3/uL (ref 0–0.7)
Eosinophils Relative: 10 %
HEMATOCRIT: 38.2 % (ref 35.0–47.0)
HEMOGLOBIN: 13.1 g/dL (ref 12.0–16.0)
Lymphocytes Relative: 24 %
Lymphs Abs: 1 10*3/uL (ref 1.0–3.6)
MCH: 34.2 pg — AB (ref 26.0–34.0)
MCHC: 34.3 g/dL (ref 32.0–36.0)
MCV: 99.8 fL (ref 80.0–100.0)
MONO ABS: 0.4 10*3/uL (ref 0.2–0.9)
MONOS PCT: 9 %
NEUTROS ABS: 2.5 10*3/uL (ref 1.4–6.5)
NEUTROS PCT: 56 %
Platelets: 288 10*3/uL (ref 150–440)
RBC: 3.83 MIL/uL (ref 3.80–5.20)
RDW: 14.2 % (ref 11.5–14.5)
WBC: 4.3 10*3/uL (ref 3.6–11.0)

## 2018-03-07 LAB — TSH: TSH: 0.846 u[IU]/mL (ref 0.350–4.500)

## 2018-03-07 MED ORDER — HEPARIN SOD (PORK) LOCK FLUSH 100 UNIT/ML IV SOLN
500.0000 [IU] | Freq: Once | INTRAVENOUS | Status: AC
  Administered 2018-03-07: 500 [IU] via INTRAVENOUS
  Filled 2018-03-07: qty 5

## 2018-03-07 MED ORDER — SODIUM CHLORIDE 0.9 % IV SOLN
10.0000 mg/kg | Freq: Once | INTRAVENOUS | Status: AC
  Administered 2018-03-07: 860 mg via INTRAVENOUS
  Filled 2018-03-07: qty 7.2

## 2018-03-07 MED ORDER — SODIUM CHLORIDE 0.9% FLUSH
10.0000 mL | Freq: Once | INTRAVENOUS | Status: AC
  Administered 2018-03-07: 10 mL via INTRAVENOUS
  Filled 2018-03-07: qty 10

## 2018-03-07 MED ORDER — SODIUM CHLORIDE 0.9 % IV SOLN
Freq: Once | INTRAVENOUS | Status: AC
  Administered 2018-03-07: 14:00:00 via INTRAVENOUS
  Filled 2018-03-07: qty 250

## 2018-03-07 NOTE — Progress Notes (Signed)
Patient here for follow up. No concerns voiced.  °

## 2018-03-07 NOTE — Progress Notes (Signed)
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   HISTORY OF PRESENTING ILLNESS:  Connie Powers is a  67 y.o.  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  CT lung cancer screen 05/27/2017   1. A few bilateral pulmonary nodules measuring up to 5 mm are indeterminate. Considering the suspicious 2.0 cm mediastinal adenopathy, follow up with PET/CT is recommended. 2. Mild bronchial wall thickening could represent airway disease including bronchitis.ACR Lung-RADS Category and Recommendation*: ACR Lung-RADS Category 2S (S modifier for mediastinal adenopathy)  PET scan 3/112019 1. No hypermetabolic pulmonary nodules.2. Hypermetabolic enlarged right paratracheal lymph node, of uncertain etiology in isolation. Lymphoproliferative disorder cannot be excluded. 3.  Aortic atherosclerosis (ICD10-170.0).  EBUS biopsy of paratracheal node showed non small cell lung cancer favoring adenocarcinoma. Case was discussed on tumor board on 09/16/2017 and consensus recommendation is to treat as Stage IIIA disease given the invasion of mediastinum.   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 MRI brain is contraindicated. CT brain negative for metastatic disease.  During the interval patient also was referred to vascular surgery for evaluation of malposition of Medi port.  Patient's right jugular port was removed and right internal jugular vein Mediport was placed by Dr.Dew on 8/5 2019.  INTERVAL HISTORY Connie Powers is a 67 y.o. female who has above history reviewed by me today presents for assessment prior to maintenance  immunotherapy for lung cancer treatment. During the interval patient has had a CT chest with contrast done. She reports doing well.   Chronic cough, no changes.  Stable.   She continues to smoke cigarettes occasionally.  Otherwise no new complaint.  Denies any skin rash, diarrhea, abdominal pain.    Current Treatment S/p concurrent Chemotherapy [name] /taxol  Weekly x 6] and RT.  Started on maintenance durvalumab on 12/27/2017.  Review of Systems  Constitutional: Positive for malaise/fatigue. Negative for chills, fever and weight loss.  HENT: Negative for congestion, ear discharge, ear pain, hearing loss, nosebleeds, sinus pain and sore throat.   Eyes: Negative for double vision, photophobia, pain, discharge and redness.  Respiratory: Positive for cough. Negative for hemoptysis, sputum production, shortness of breath and wheezing.   Cardiovascular: Negative for chest pain, palpitations, orthopnea, claudication and leg swelling.  Gastrointestinal: Negative for abdominal pain, blood in stool, constipation, diarrhea, heartburn, melena, nausea and vomiting.  Genitourinary: Negative for dysuria, flank pain, frequency and hematuria.  Musculoskeletal: Negative for back pain, myalgias and neck pain.  Skin: Negative for itching and rash.  Neurological: Negative for dizziness, tingling, tremors, sensory change, focal weakness, weakness and headaches.  Endo/Heme/Allergies: Negative for environmental allergies. Does not bruise/bleed easily.  Psychiatric/Behavioral: Negative for depression, hallucinations and substance abuse. The patient is not nervous/anxious.     MEDICAL HISTORY:  Past Medical History:  Diagnosis Date  . Brain aneurysm   . Difficult intubation   . Hypertension   . Non-small cell cancer of right lung ([place]) 09/22/2017   Chemo + Rad tx's.   . Viral meningitis     SURGICAL HISTORY: Past Surgical History:  Procedure Laterality Date  . ENDOBRONCHIAL ULTRASOUND N/A 09/09/2017    Procedure: ENDOBRONCHIAL ULTRASOUND;  Surgeon: Laverle Hobby, MD;  Location: [facility] ORS;  Service: Pulmonary;  Laterality: N/A;  . OOPHORECTOMY Left   . PORTA CATH INSERTION N/A 09/29/2017   Procedure: PORTA CATH INSERTION;  Surgeon: Algernon Huxley, MD;  Location: [place] [facility];  Service: Cardiovascular;  Laterality: N/A;  . PORTA CATH INSERTION N/A 01/03/2018   Procedure: PORTA CATH INSERTION;  Surgeon: Algernon Huxley, MD;  Location: [place] [facility];  Service: Cardiovascular;  Laterality: N/A;  . surgical repair for brain tumor  1969   clips in head  . WISDOM TOOTH EXTRACTION      SOCIAL HISTORY: Social History   Socioeconomic History  . Marital status: Married    Spouse name: Not on file  . Number of children: Not on file  . Years of education: Not on file  . Highest education level: Not on file  Occupational History  . Not on file  Social Needs  . Financial resource strain: Not on file  . Food insecurity:    Worry: Not on file    Inability: Not on file  . Transportation needs:    Medical: Not on file    Non-medical: Not on file  Tobacco Use  . Smoking status: Former Smoker    Packs/day: 0.25    Last attempt to quit: 10/03/2017    Years since quitting: 0.4  . Smokeless tobacco: Never Used  Substance and Sexual Activity  . Alcohol use: Not Currently    Comment: social  . Drug use: Not Currently    Types: Cocaine  . Sexual activity: Not on file  Lifestyle  . Physical activity:    Days per week: Not on file    Minutes per session: Not on file  . Stress: Not on file  Relationships  . Social connections:    Talks on phone: Not on file    Gets together: Not on file    Attends religious service: Not on file    Active member of club or organization: Not on file    Attends meetings of clubs or organizations: Not on file    Relationship status: Not on file  . Intimate partner violence:    Fear of current or ex partner: Not on file    Emotionally abused: Not on  file    Physically abused: Not on file    Forced sexual activity: Not on file  Other Topics Concern  . Not on file  Social History Narrative  . Not on file    FAMILY HISTORY: Family History  Problem Relation Age of Onset  . Diabetes Mother   . Hypertension Mother   . Hyperlipidemia Mother   . Arthritis Mother   . Hypertension Father   . Hyperlipidemia Father   . Heart attack Father        x2  . Breast cancer Neg Hx     ALLERGIES:  has No Known Allergies.  MEDICATIONS:  Current Outpatient Medications  Medication Sig Dispense Refill  . amLODipine (NORVASC) 10 MG tablet Take 10 mg by mouth daily.  11  . aspirin EC 81 MG tablet Take 81 mg by mouth daily.    . Cyanocobalamin (B-12) 1000 MCG CAPS Take 1,000 mcg by mouth daily. 30 capsule 3  . docusate sodium (COLACE) 100 MG capsule Take 100 mg by mouth daily.    Marland Kitchen lidocaine-prilocaine (EMLA) cream Apply to affected area once 30 g 3  . ondansetron (ZOFRAN) 8 MG tablet Take 1 tablet (8 mg total)  by mouth 2 (two) times daily as needed for refractory nausea / vomiting. Start on day 3 after chemo. 30 tablet 1  . prochlorperazine (COMPAZINE) 10 MG tablet Take 1 tablet (10 mg total) by mouth every 6 (six) hours as needed (Nausea or vomiting). 30 tablet 1  . umeclidinium-vilanterol (ANORO ELLIPTA) 62.5-25 MCG/INH AEPB Inhale 1 puff into the lungs daily as needed (shortness of breath).      No current facility-administered medications for this visit.      PHYSICAL EXAMINATION: ECOG PERFORMANCE STATUS: 0 - Asymptomatic Vitals:   03/07/18 1315  BP: 135/82  Pulse: 85  Resp: 18  Temp: (!) 97.4 F (36.3 C)  SpO2: 99%   Filed Weights   03/07/18 1315  Weight: 196 lb 6.4 oz (89.1 kg)    Physical Exam  Constitutional: She is oriented to person, place, and time. No distress.  HENT:  Head: Normocephalic and atraumatic.  Mouth/Throat: Oropharynx is clear and moist.  Eyes: Pupils are equal, round, and reactive to light. Conjunctivae  and EOM are normal. Left eye exhibits no discharge. No scleral icterus.  Neck: Normal range of motion. Neck supple.  Cardiovascular: Normal rate, regular rhythm and normal heart sounds.  No murmur heard. Pulmonary/Chest: Effort normal. No respiratory distress. She has no wheezes. She has no rales. She exhibits no tenderness.  Decreased breath sound bilaterally.    Abdominal: Soft. Bowel sounds are normal. She exhibits no distension and no mass. There is no tenderness. There is no guarding.  Musculoskeletal: Normal range of motion. She exhibits no edema or deformity.  Lymphadenopathy:    She has no cervical adenopathy.  Neurological: She is alert and oriented to person, place, and time. No cranial nerve deficit. Coordination normal.  Skin: Skin is warm and dry. No rash noted. No erythema.  Psychiatric: She has a normal mood and affect. Her behavior is normal. Thought content normal.     LABORATORY DATA:  I have reviewed the data as listed Lab Results  Component Value Date   WBC 4.3 03/07/2018   HGB 13.1 03/07/2018   HCT 38.2 03/07/2018   MCV 99.8 03/07/2018   PLT 288 03/07/2018   Recent Labs    02/07/18 1015 02/21/18 1009 03/07/18 1247  NA 135 137 138  K 3.7 4.0 3.7  CL 103 105 105  CO2 25 24 25   GLUCOSE 176* 157* 168*  BUN 6* 5* 6*  CREATININE 0.58 0.62 0.55  CALCIUM 9.2 9.0 9.1  GFRNONAA >60 >60 >60  GFRAA >60 >60 >60  PROT 7.4 7.1 7.3  ALBUMIN 4.0 3.9 3.9  AST 24 26 23   ALT 20 20 19   ALKPHOS 102 103 106  BILITOT 0.6 0.5 0.7   RADIOGRAPHIC STUDIES: I have personally reviewed the radiological images as listed and agreed with the findings in the report.  12/20/2017 Repeat CT scan showed interval decreased of disease. CT images were Independently reviewed by me and discussed with patient. New 55mm right lung lesion, indeterminate.  02/25/2018 CT chest with contrast, presumed evolutionary changes of radiation therapy in the upper right hemithorax.  Aortic  atherosclerosis, emphysema.  ASSESSMENT & PLAN:  Cancer Staging Non-small cell cancer of right lung [facility]) Staging form: Lung, AJCC 8th Edition - Clinical stage from 09/22/2017: Stage Unknown (cTX, cN1, cM0) - Signed by Earlie Server, MD on 09/22/2017 - Pathologic: No stage assigned - Unsigned  1. Non-small cell cancer of right lung ([place])   2. Encounter for antineoplastic immunotherapy   3. Macrocytosis without  anemia    #Clinical stage IIIA lung adenocarcinoma Labs reviewed and discussed with patient.  Counts acceptable to proceed with today's durvalumab treatment.  No signs of immunotherapy related toxicities. CT image was independently reviewed by me and discussed with patient.  Stable disease.  # Macrocytosis, stable.  Continue B12 supplementation.   All questions were answered. The patient knows to call the clinic with any problems questions or concerns. Return visits: 2 weeks Total face to face encounter time for this patient visit was 77min. >50% of the time was  spent in counseling and coordination of care.    Earlie Server, MD, PhD Hematology Oncology [facility] Pager- [phone] 03/07/2018

## 2018-03-21 ENCOUNTER — Inpatient Hospital Stay: Payer: Medicare HMO

## 2018-03-21 ENCOUNTER — Encounter: Payer: Self-pay | Admitting: Oncology

## 2018-03-21 ENCOUNTER — Other Ambulatory Visit: Payer: Self-pay

## 2018-03-21 ENCOUNTER — Inpatient Hospital Stay (HOSPITAL_BASED_OUTPATIENT_CLINIC_OR_DEPARTMENT_OTHER): Payer: Medicare HMO | Admitting: Oncology

## 2018-03-21 VITALS — BP 145/87 | HR 72 | Temp 97.8°F | Resp 20 | Ht 64.0 in | Wt 193.1 lb

## 2018-03-21 DIAGNOSIS — C3491 Malignant neoplasm of unspecified part of right bronchus or lung: Secondary | ICD-10-CM

## 2018-03-21 DIAGNOSIS — Z5112 Encounter for antineoplastic immunotherapy: Secondary | ICD-10-CM

## 2018-03-21 DIAGNOSIS — D7589 Other specified diseases of blood and blood-forming organs: Secondary | ICD-10-CM

## 2018-03-21 DIAGNOSIS — Z87891 Personal history of nicotine dependence: Secondary | ICD-10-CM | POA: Diagnosis not present

## 2018-03-21 LAB — CBC WITH DIFFERENTIAL/PLATELET
ABS IMMATURE GRANULOCYTES: 0.02 10*3/uL (ref 0.00–0.07)
BASOS PCT: 1 %
Basophils Absolute: 0.1 10*3/uL (ref 0.0–0.1)
EOS ABS: 0.4 10*3/uL (ref 0.0–0.5)
Eosinophils Relative: 8 %
HCT: 38.6 % (ref 36.0–46.0)
Hemoglobin: 13.1 g/dL (ref 12.0–15.0)
IMMATURE GRANULOCYTES: 0 %
Lymphocytes Relative: 19 %
Lymphs Abs: 1 10*3/uL (ref 0.7–4.0)
MCH: 33.5 pg (ref 26.0–34.0)
MCHC: 33.9 g/dL (ref 30.0–36.0)
MCV: 98.7 fL (ref 80.0–100.0)
MONO ABS: 0.4 10*3/uL (ref 0.1–1.0)
Monocytes Relative: 7 %
NEUTROS ABS: 3.6 10*3/uL (ref 1.7–7.7)
NEUTROS PCT: 65 %
PLATELETS: 321 10*3/uL (ref 150–400)
RBC: 3.91 MIL/uL (ref 3.87–5.11)
RDW: 13.3 % (ref 11.5–15.5)
WBC: 5.5 10*3/uL (ref 4.0–10.5)
nRBC: 0 % (ref 0.0–0.2)

## 2018-03-21 LAB — COMPREHENSIVE METABOLIC PANEL
ALT: 16 U/L (ref 0–44)
ANION GAP: 8 (ref 5–15)
AST: 24 U/L (ref 15–41)
Albumin: 4 g/dL (ref 3.5–5.0)
Alkaline Phosphatase: 100 U/L (ref 38–126)
BUN: 6 mg/dL — ABNORMAL LOW (ref 8–23)
CALCIUM: 9.4 mg/dL (ref 8.9–10.3)
CO2: 25 mmol/L (ref 22–32)
Chloride: 105 mmol/L (ref 98–111)
Creatinine, Ser: 0.7 mg/dL (ref 0.44–1.00)
GFR calc Af Amer: >60 mL/min (ref >60)
Glucose, Bld: 181 mg/dL — ABNORMAL HIGH (ref 70–99)
POTASSIUM: 3.8 mmol/L (ref 3.5–5.1)
Sodium: 138 mmol/L (ref 135–145)
Total Bilirubin: 0.6 mg/dL (ref 0.3–1.2)
Total Protein: 7.2 g/dL (ref 6.5–8.1)

## 2018-03-21 LAB — TSH: TSH: 1.012 u[IU]/mL (ref 0.350–4.500)

## 2018-03-21 MED ORDER — HEPARIN SOD (PORK) LOCK FLUSH 100 UNIT/ML IV SOLN
500.0000 [IU] | Freq: Once | INTRAVENOUS | Status: AC
  Administered 2018-03-21: 500 [IU] via INTRAVENOUS
  Filled 2018-03-21: qty 5

## 2018-03-21 MED ORDER — SODIUM CHLORIDE 0.9 % IV SOLN
Freq: Once | INTRAVENOUS | Status: AC
  Administered 2018-03-21: 12:00:00 via INTRAVENOUS
  Filled 2018-03-21: qty 250

## 2018-03-21 MED ORDER — SODIUM CHLORIDE 0.9% FLUSH
10.0000 mL | Freq: Once | INTRAVENOUS | Status: AC
  Administered 2018-03-21: 10 mL via INTRAVENOUS
  Filled 2018-03-21: qty 10

## 2018-03-21 MED ORDER — SODIUM CHLORIDE 0.9 % IV SOLN
10.0000 mg/kg | Freq: Once | INTRAVENOUS | Status: AC
  Administered 2018-03-21: 860 mg via INTRAVENOUS
  Filled 2018-03-21: qty 10

## 2018-03-21 NOTE — Progress Notes (Signed)
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   HISTORY OF PRESENTING ILLNESS:  Connie Powers is a  67 y.o.  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  CT lung cancer screen 05/27/2017   1. A few bilateral pulmonary nodules measuring up to 5 mm are indeterminate. Considering the suspicious 2.0 cm mediastinal adenopathy, follow up with PET/CT is recommended. 2. Mild bronchial wall thickening could represent airway disease including bronchitis.ACR Lung-RADS Category and Recommendation*: ACR Lung-RADS Category 2S (S modifier for mediastinal adenopathy)  PET scan 3/112019 1. No hypermetabolic pulmonary nodules.2. Hypermetabolic enlarged right paratracheal lymph node, of uncertain etiology in isolation. Lymphoproliferative disorder cannot be excluded. 3.  Aortic atherosclerosis (ICD10-170.0).  EBUS biopsy of paratracheal node showed non small cell lung cancer favoring adenocarcinoma. Case was discussed on tumor board on 09/16/2017 and consensus recommendation is to treat as Stage IIIA disease given the invasion of mediastinum.   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 MRI brain is contraindicated. CT brain negative for metastatic disease.  During the interval patient also was referred to vascular surgery for evaluation of malposition of Medi port.  Patient's right jugular port was removed and right internal jugular vein Mediport was placed by Dr.Dew on 8/5 2019.  INTERVAL HISTORY Connie Powers is a 67 y.o. female who has above history reviewed by me today presents for assessment prior to maintenance  immunotherapy for Stage IIIA NSCLC  # reports tolerating immunotherapy well. Denies any diarrhea, skin rash, shortness of breath.  Chronic cough at baseline, no changes.  Reports stopped smoking completely.    Current Treatment S/p concurrent Chemotherapy [name] /taxol  Weekly x 6] and RT.  Started on maintenance durvalumab on 12/27/2017.  Review of Systems  Constitutional: Positive for malaise/fatigue. Negative for chills, fever and weight loss.  HENT: Negative for congestion, ear discharge, ear pain, hearing loss, nosebleeds, sinus pain and sore throat.   Eyes: Negative for double vision, photophobia, pain, discharge and redness.  Respiratory: Positive for cough. Negative for hemoptysis, sputum production, shortness of breath and wheezing.   Cardiovascular: Negative for chest pain, palpitations, orthopnea, claudication and leg swelling.  Gastrointestinal: Negative for abdominal pain, blood in stool, constipation, diarrhea, heartburn, melena, nausea and vomiting.  Genitourinary: Negative for dysuria, flank pain, frequency and hematuria.  Musculoskeletal: Negative for back pain, myalgias and neck pain.  Skin: Negative for itching and rash.  Neurological: Negative for dizziness, tingling, tremors, sensory change, focal weakness, weakness and headaches.  Endo/Heme/Allergies: Negative for environmental allergies. Does not bruise/bleed easily.  Psychiatric/Behavioral: Negative for depression, hallucinations and substance abuse. The patient is not nervous/anxious.     MEDICAL HISTORY:  Past Medical History:  Diagnosis Date  . Brain aneurysm   . Difficult intubation   . Hypertension   . Non-small cell cancer of right lung ([place]) 09/22/2017   Chemo + Rad tx's.   . Viral meningitis     SURGICAL HISTORY: Past Surgical History:  Procedure Laterality Date  . ENDOBRONCHIAL ULTRASOUND N/A 09/09/2017   Procedure: ENDOBRONCHIAL ULTRASOUND;  Surgeon: Laverle Hobby, MD;  Location: [facility]  ORS;  Service: Pulmonary;  Laterality: N/A;  . OOPHORECTOMY Left   .  PORTA CATH INSERTION N/A 09/29/2017   Procedure: PORTA CATH INSERTION;  Surgeon: Algernon Huxley, MD;  Location: [place] [facility];  Service: Cardiovascular;  Laterality: N/A;  . PORTA CATH INSERTION N/A 01/03/2018   Procedure: PORTA CATH INSERTION;  Surgeon: Algernon Huxley, MD;  Location: [place] [facility];  Service: Cardiovascular;  Laterality: N/A;  . surgical repair for brain tumor  1969   clips in head  . WISDOM TOOTH EXTRACTION      SOCIAL HISTORY: Social History   Socioeconomic History  . Marital status: Married    Spouse name: Not on file  . Number of children: Not on file  . Years of education: Not on file  . Highest education level: Not on file  Occupational History  . Not on file  Social Needs  . Financial resource strain: Not on file  . Food insecurity:    Worry: Not on file    Inability: Not on file  . Transportation needs:    Medical: Not on file    Non-medical: Not on file  Tobacco Use  . Smoking status: Former Smoker    Packs/day: 0.25    Last attempt to quit: 10/03/2017    Years since quitting: 0.4  . Smokeless tobacco: Never Used  Substance and Sexual Activity  . Alcohol use: Not Currently    Comment: social  . Drug use: Not Currently    Types: Cocaine  . Sexual activity: Not on file  Lifestyle  . Physical activity:    Days per week: Not on file    Minutes per session: Not on file  . Stress: Not on file  Relationships  . Social connections:    Talks on phone: Not on file    Gets together: Not on file    Attends religious service: Not on file    Active member of club or organization: Not on file    Attends meetings of clubs or organizations: Not on file    Relationship status: Not on file  . Intimate partner violence:    Fear of current or ex partner: Not on file    Emotionally abused: Not on file    Physically abused: Not on file    Forced sexual activity: Not on file  Other  Topics Concern  . Not on file  Social History Narrative  . Not on file    FAMILY HISTORY: Family History  Problem Relation Age of Onset  . Diabetes Mother   . Hypertension Mother   . Hyperlipidemia Mother   . Arthritis Mother   . Hypertension Father   . Hyperlipidemia Father   . Heart attack Father        x2  . Breast cancer Neg Hx     ALLERGIES:  has No Known Allergies.  MEDICATIONS:  Current Outpatient Medications  Medication Sig Dispense Refill  . amLODipine (NORVASC) 10 MG tablet Take 10 mg by mouth daily.  11  . aspirin EC 81 MG tablet Take 81 mg by mouth daily.    . Cyanocobalamin (B-12) 1000 MCG CAPS Take 1,000 mcg by mouth daily. 30 capsule 3  . docusate sodium (COLACE) 100 MG capsule Take 100 mg by mouth daily.    Marland Kitchen lidocaine-prilocaine (EMLA) cream Apply to affected area once 30 g 3  . ondansetron (ZOFRAN) 8 MG tablet Take 1 tablet (8 mg total) by mouth 2 (two) times daily as needed for refractory nausea / vomiting. Start on day 3 after chemo. 30 tablet  1  . prochlorperazine (COMPAZINE) 10 MG tablet Take 1 tablet (10 mg total) by mouth every 6 (six) hours as needed (Nausea or vomiting). 30 tablet 1  . umeclidinium-vilanterol (ANORO ELLIPTA) 62.5-25 MCG/INH AEPB Inhale 1 puff into the lungs daily as needed (shortness of breath).      No current facility-administered medications for this visit.      PHYSICAL EXAMINATION: ECOG PERFORMANCE STATUS: 0 - Asymptomatic Vitals:   03/21/18 1103 03/21/18 1110  BP:  (!) 145/87  Pulse:  72  Resp: 20   Temp:  97.8 F (36.6 C)   Filed Weights   03/21/18 1103  Weight: 193 lb 1.6 oz (87.6 kg)    Physical Exam  Constitutional: She is oriented to person, place, and time. No distress.  HENT:  Head: Normocephalic and atraumatic.  Mouth/Throat: Oropharynx is clear and moist.  Eyes: Pupils are equal, round, and reactive to light. Conjunctivae and EOM are normal. Left eye exhibits no discharge. No scleral icterus.  Neck:  Normal range of motion. Neck supple.  Cardiovascular: Normal rate, regular rhythm and normal heart sounds.  No murmur heard. Pulmonary/Chest: Effort normal. No respiratory distress. She has no wheezes. She has no rales. She exhibits no tenderness.  Decreased breath sound bilaterally.    Abdominal: Soft. Bowel sounds are normal. She exhibits no distension and no mass. There is no tenderness. There is no guarding.  Musculoskeletal: Normal range of motion. She exhibits no edema or deformity.  Lymphadenopathy:    She has no cervical adenopathy.  Neurological: She is alert and oriented to person, place, and time. No cranial nerve deficit. Coordination normal.  Skin: Skin is warm and dry. No rash noted. No erythema.  Psychiatric: She has a normal mood and affect. Her behavior is normal. Thought content normal.     LABORATORY DATA:  I have reviewed the data as listed Lab Results  Component Value Date   WBC 5.5 03/21/2018   HGB 13.1 03/21/2018   HCT 38.6 03/21/2018   MCV 98.7 03/21/2018   PLT 321 03/21/2018   Recent Labs    02/21/18 1009 03/07/18 1247 03/21/18 1055  NA 137 138 138  K 4.0 3.7 3.8  CL 105 105 105  CO2 24 25 25   GLUCOSE 157* 168* 181*  BUN 5* 6* 6*  CREATININE 0.62 0.55 0.70  CALCIUM 9.0 9.1 9.4  GFRNONAA >60 >60 >60  GFRAA >60 >60 >60  PROT 7.1 7.3 7.2  ALBUMIN 3.9 3.9 4.0  AST 26 23 24   ALT 20 19 16   ALKPHOS 103 106 100  BILITOT 0.5 0.7 0.6   RADIOGRAPHIC STUDIES: I have personally reviewed the radiological images as listed and agreed with the findings in the report.  12/20/2017 Repeat CT scan showed interval decreased of disease. CT images were Independently reviewed by me and discussed with patient. New 14mm right lung lesion, indeterminate.  02/25/2018 CT chest with contrast, presumed evolutionary changes of radiation therapy in the upper right hemithorax.  Aortic atherosclerosis, emphysema.  ASSESSMENT & PLAN:  Cancer Staging Non-small cell cancer of  right lung [facility]) Staging form: Lung, AJCC 8th Edition - Clinical stage from 09/22/2017: Stage Unknown (cTX, cN1, cM0) - Signed by Earlie Server, MD on 09/22/2017 - Pathologic: No stage assigned - Unsigned  1. Non-small cell cancer of right lung ([place])   2. Encounter for antineoplastic immunotherapy   3. Macrocytosis without anemia    #Clinical stage IIIA lung adenocarcinoma Labs reviewed and discussed with patient. Counts are acceptable  to proceed with today's durvalumab treatment.  No signs of immunotherapy related toxicities.   # Macrocytosis, resolved. Continue on vitamin B12 supplements.   Sister asks many questions and we spent sufficient time to discuss many aspect of care, questions were answered to patient's satisfaction. The patient knows to call the clinic with any problems questions or concerns. Return visits: 2 weeks  Total face to face encounter time for this patient visit was 25 min. >50% of the time was  spent in counseling and coordination of care.    Earlie Server, MD, PhD Hematology Oncology [facility] at [facility] Pager- [phone] 03/21/2018

## 2018-03-21 NOTE — Progress Notes (Signed)
Patient here for pre treatment check. No changes since her last treatment.

## 2018-04-04 ENCOUNTER — Other Ambulatory Visit: Payer: Self-pay

## 2018-04-04 ENCOUNTER — Inpatient Hospital Stay: Payer: Medicare HMO | Attending: Oncology

## 2018-04-04 ENCOUNTER — Inpatient Hospital Stay (HOSPITAL_BASED_OUTPATIENT_CLINIC_OR_DEPARTMENT_OTHER): Payer: Medicare HMO | Admitting: Oncology

## 2018-04-04 ENCOUNTER — Inpatient Hospital Stay: Payer: Medicare HMO

## 2018-04-04 ENCOUNTER — Encounter: Payer: Self-pay | Admitting: Oncology

## 2018-04-04 VITALS — BP 155/77 | HR 83 | Temp 98.1°F | Resp 16 | Ht 64.0 in | Wt 192.3 lb

## 2018-04-04 DIAGNOSIS — Z5112 Encounter for antineoplastic immunotherapy: Secondary | ICD-10-CM | POA: Diagnosis not present

## 2018-04-04 DIAGNOSIS — C3491 Malignant neoplasm of unspecified part of right bronchus or lung: Secondary | ICD-10-CM

## 2018-04-04 DIAGNOSIS — R05 Cough: Secondary | ICD-10-CM

## 2018-04-04 DIAGNOSIS — Z79899 Other long term (current) drug therapy: Secondary | ICD-10-CM | POA: Diagnosis not present

## 2018-04-04 DIAGNOSIS — Z720 Tobacco use: Secondary | ICD-10-CM

## 2018-04-04 DIAGNOSIS — R059 Cough, unspecified: Secondary | ICD-10-CM

## 2018-04-04 LAB — TSH: TSH: 1.315 u[IU]/mL (ref 0.350–4.500)

## 2018-04-04 LAB — COMPREHENSIVE METABOLIC PANEL
ALBUMIN: 3.8 g/dL (ref 3.5–5.0)
ALT: 17 U/L (ref 0–44)
ANION GAP: 8 (ref 5–15)
AST: 17 U/L (ref 15–41)
Alkaline Phosphatase: 93 U/L (ref 38–126)
BILIRUBIN TOTAL: 0.4 mg/dL (ref 0.3–1.2)
BUN: 6 mg/dL — ABNORMAL LOW (ref 8–23)
CO2: 25 mmol/L (ref 22–32)
Calcium: 8.8 mg/dL — ABNORMAL LOW (ref 8.9–10.3)
Chloride: 105 mmol/L (ref 98–111)
Creatinine, Ser: 0.48 mg/dL (ref 0.44–1.00)
GLUCOSE: 122 mg/dL — AB (ref 70–99)
POTASSIUM: 3.6 mmol/L (ref 3.5–5.1)
Sodium: 138 mmol/L (ref 135–145)
TOTAL PROTEIN: 7.2 g/dL (ref 6.5–8.1)

## 2018-04-04 LAB — CBC WITH DIFFERENTIAL/PLATELET
Abs Immature Granulocytes: 0.01 10*3/uL (ref 0.00–0.07)
BASOS ABS: 0.1 10*3/uL (ref 0.0–0.1)
BASOS PCT: 1 %
EOS ABS: 0.6 10*3/uL — AB (ref 0.0–0.5)
Eosinophils Relative: 11 %
HEMATOCRIT: 39.4 % (ref 36.0–46.0)
Hemoglobin: 13.1 g/dL (ref 12.0–15.0)
IMMATURE GRANULOCYTES: 0 %
Lymphocytes Relative: 21 %
Lymphs Abs: 1.2 10*3/uL (ref 0.7–4.0)
MCH: 32.8 pg (ref 26.0–34.0)
MCHC: 33.2 g/dL (ref 30.0–36.0)
MCV: 98.5 fL (ref 80.0–100.0)
MONOS PCT: 8 %
Monocytes Absolute: 0.5 10*3/uL (ref 0.1–1.0)
NEUTROS PCT: 59 %
NRBC: 0 % (ref 0.0–0.2)
Neutro Abs: 3.5 10*3/uL (ref 1.7–7.7)
Platelets: 279 10*3/uL (ref 150–400)
RBC: 4 MIL/uL (ref 3.87–5.11)
RDW: 12.8 % (ref 11.5–15.5)
WBC: 5.8 10*3/uL (ref 4.0–10.5)

## 2018-04-04 MED ORDER — SODIUM CHLORIDE 0.9% FLUSH
10.0000 mL | Freq: Once | INTRAVENOUS | Status: AC
  Administered 2018-04-04: 10 mL via INTRAVENOUS
  Filled 2018-04-04: qty 10

## 2018-04-04 MED ORDER — HEPARIN SOD (PORK) LOCK FLUSH 100 UNIT/ML IV SOLN
500.0000 [IU] | Freq: Once | INTRAVENOUS | Status: AC
  Administered 2018-04-04: 500 [IU] via INTRAVENOUS
  Filled 2018-04-04: qty 5

## 2018-04-04 MED ORDER — SODIUM CHLORIDE 0.9 % IV SOLN
10.0000 mg/kg | Freq: Once | INTRAVENOUS | Status: AC
  Administered 2018-04-04: 860 mg via INTRAVENOUS
  Filled 2018-04-04: qty 10

## 2018-04-04 MED ORDER — SODIUM CHLORIDE 0.9 % IV SOLN
Freq: Once | INTRAVENOUS | Status: AC
  Administered 2018-04-04: 10:00:00 via INTRAVENOUS
  Filled 2018-04-04: qty 250

## 2018-04-04 MED ORDER — HEPARIN SOD (PORK) LOCK FLUSH 100 UNIT/ML IV SOLN: 500.0000 [IU] | Freq: Once | INTRAVENOUS | Status: DC | PRN

## 2018-04-04 NOTE — Progress Notes (Signed)
Patient here today for follow up and chemotherapy.  Patient states no new concerns today

## 2018-04-05 NOTE — Progress Notes (Signed)
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   HISTORY OF PRESENTING ILLNESS:  Connie Powers is a  67 y.o.  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  CT lung cancer screen 05/27/2017   1. A few bilateral pulmonary nodules measuring up to 5 mm are indeterminate. Considering the suspicious 2.0 cm mediastinal adenopathy, follow up with PET/CT is recommended. 2. Mild bronchial wall thickening could represent airway disease including bronchitis.ACR Lung-RADS Category and Recommendation*: ACR Lung-RADS Category 2S (S modifier for mediastinal adenopathy)  PET scan 3/112019 1. No hypermetabolic pulmonary nodules.2. Hypermetabolic enlarged right paratracheal lymph node, of uncertain etiology in isolation. Lymphoproliferative disorder cannot be excluded. 3.  Aortic atherosclerosis (ICD10-170.0).  EBUS biopsy of paratracheal node showed non small cell lung cancer favoring adenocarcinoma. Case was discussed on tumor board on 09/16/2017 and consensus recommendation is to treat as Stage IIIA disease given the invasion of mediastinum.   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 MRI brain is contraindicated. CT brain negative for metastatic disease.  During the interval patient also was referred to vascular surgery for evaluation of malposition of Medi port.  Patient's right jugular port was removed and right internal jugular vein Mediport was placed by Dr.Dew on 8/5 2019.  INTERVAL HISTORY Connie Powers is a 67 y.o. female who has above history reviewed by me today presents for assessment prior to maintenance  immunotherapy for stage IIIa non-small cell lung cancer.  #She continues to do well at baseline.  Tolerate immunotherapy well.  Denies any diarrhea, skin rash, shortness of breath.  She still smokes a few cigarettes every day. Chronic cough at baseline.  No changes.   Current Treatment S/p concurrent Chemotherapy [name] /taxol  Weekly x 6] and RT.  Started on maintenance durvalumab every 2 weeks on 12/27/2017.  Review of Systems  Constitutional: Negative for chills, fever, malaise/fatigue and weight loss.  HENT: Negative for congestion, ear discharge, ear pain, hearing loss, nosebleeds, sinus pain and sore throat.   Eyes: Negative for double vision, photophobia, pain, discharge and redness.  Respiratory: Positive for cough. Negative for hemoptysis, sputum production, shortness of breath and wheezing.   Cardiovascular: Negative for chest pain, palpitations, orthopnea, claudication and leg swelling.  Gastrointestinal: Negative for abdominal pain, blood in stool, constipation, diarrhea, heartburn, melena, nausea and vomiting.  Genitourinary: Negative for dysuria, flank pain, frequency and hematuria.  Musculoskeletal: Negative for back pain, myalgias and neck pain.  Skin: Negative for itching and rash.  Neurological: Negative for dizziness, tingling, tremors, sensory change, focal weakness, weakness and headaches.  Endo/Heme/Allergies: Negative for environmental allergies. Does not bruise/bleed easily.  Psychiatric/Behavioral: Negative for depression, hallucinations and substance abuse. The patient is not nervous/anxious.     MEDICAL HISTORY:  Past Medical History:  Diagnosis Date  . Brain aneurysm   . Difficult intubation   . Hypertension   . Non-small cell cancer of right lung ([place]) 09/22/2017   Chemo + Rad tx's.   . Viral meningitis     SURGICAL HISTORY: Past Surgical History:  Procedure Laterality Date  . ENDOBRONCHIAL ULTRASOUND N/A 09/09/2017   Procedure: ENDOBRONCHIAL  ULTRASOUND;  Surgeon: Laverle Hobby, MD;  Location: [facility] ORS;  Service: Pulmonary;  Laterality: N/A;  . OOPHORECTOMY Left   . PORTA CATH INSERTION N/A 09/29/2017   Procedure: PORTA CATH INSERTION;  Surgeon: Algernon Huxley, MD;  Location: [place] [facility];  Service: Cardiovascular;  Laterality: N/A;  . PORTA CATH INSERTION N/A 01/03/2018   Procedure: PORTA CATH INSERTION;  Surgeon: Algernon Huxley, MD;  Location: [place] [facility];  Service: Cardiovascular;  Laterality: N/A;  . surgical repair for brain tumor  1969   clips in head  . WISDOM TOOTH EXTRACTION      SOCIAL HISTORY: Social History   Socioeconomic History  . Marital status: Married    Spouse name: Not on file  . Number of children: Not on file  . Years of education: Not on file  . Highest education level: Not on file  Occupational History  . Not on file  Social Needs  . Financial resource strain: Not on file  . Food insecurity:    Worry: Not on file    Inability: Not on file  . Transportation needs:    Medical: Not on file    Non-medical: Not on file  Tobacco Use  . Smoking status: Former Smoker    Packs/day: 0.25    Last attempt to quit: 10/03/2017    Years since quitting: 0.5  . Smokeless tobacco: Never Used  Substance and Sexual Activity  . Alcohol use: Not Currently    Comment: social  . Drug use: Not Currently    Types: Cocaine  . Sexual activity: Not on file  Lifestyle  . Physical activity:    Days per week: Not on file    Minutes per session: Not on file  . Stress: Not on file  Relationships  . Social connections:    Talks on phone: Not on file    Gets together: Not on file    Attends religious service: Not on file    Active member of club or organization: Not on file    Attends meetings of clubs or organizations: Not on file    Relationship status: Not on file  . Intimate partner violence:    Fear of current or ex partner: Not on file    Emotionally abused: Not on file    Physically  abused: Not on file    Forced sexual activity: Not on file  Other Topics Concern  . Not on file  Social History Narrative  . Not on file    FAMILY HISTORY: Family History  Problem Relation Age of Onset  . Diabetes Mother   . Hypertension Mother   . Hyperlipidemia Mother   . Arthritis Mother   . Hypertension Father   . Hyperlipidemia Father   . Heart attack Father        x2  . Breast cancer Neg Hx     ALLERGIES:  has No Known Allergies.  MEDICATIONS:  Current Outpatient Medications  Medication Sig Dispense Refill  . amLODipine (NORVASC) 10 MG tablet Take 10 mg by mouth daily.  11  . aspirin EC 81 MG tablet Take 81 mg by mouth daily.    . Cyanocobalamin (B-12) 1000 MCG CAPS Take 1,000 mcg by mouth daily. 30 capsule 3  . docusate sodium (COLACE) 100 MG capsule Take 100 mg by mouth daily.    Marland Kitchen lidocaine-prilocaine (EMLA) cream Apply to affected area once 30 g 3  . ondansetron (ZOFRAN) 8 MG tablet Take 1 tablet (8 mg total) by mouth 2 (two) times  daily as needed for refractory nausea / vomiting. Start on day 3 after chemo. 30 tablet 1  . prochlorperazine (COMPAZINE) 10 MG tablet Take 1 tablet (10 mg total) by mouth every 6 (six) hours as needed (Nausea or vomiting). 30 tablet 1  . umeclidinium-vilanterol (ANORO ELLIPTA) 62.5-25 MCG/INH AEPB Inhale 1 puff into the lungs daily as needed (shortness of breath).      No current facility-administered medications for this visit.      PHYSICAL EXAMINATION: ECOG PERFORMANCE STATUS: 0 - Asymptomatic Vitals:   04/04/18 0937 04/04/18 0941  BP: (!) 155/77   Pulse: 83   Resp: 16   Temp: 98.1 F (36.7 C)   SpO2:  97%   Filed Weights   04/04/18 0937  Weight: 192 lb 5 oz (87.2 kg)    Physical Exam  Constitutional: She is oriented to person, place, and time. No distress.  HENT:  Head: Normocephalic and atraumatic.  Mouth/Throat: Oropharynx is clear and moist.  Eyes: Pupils are equal, round, and reactive to light. Conjunctivae  and EOM are normal. Left eye exhibits no discharge. No scleral icterus.  Neck: Normal range of motion. Neck supple.  Cardiovascular: Normal rate, regular rhythm and normal heart sounds.  No murmur heard. Pulmonary/Chest: Effort normal. No respiratory distress. She has no wheezes. She has no rales. She exhibits no tenderness.  Decreased breath sound bilaterally.    Abdominal: Soft. Bowel sounds are normal. She exhibits no distension and no mass. There is no tenderness. There is no guarding.  Musculoskeletal: Normal range of motion. She exhibits no edema or deformity.  Lymphadenopathy:    She has no cervical adenopathy.  Neurological: She is alert and oriented to person, place, and time. No cranial nerve deficit. Coordination normal.  Skin: Skin is warm and dry. No rash noted. No erythema.  Psychiatric: She has a normal mood and affect. Her behavior is normal. Thought content normal.     LABORATORY DATA:  I have reviewed the data as listed Lab Results  Component Value Date   WBC 5.8 04/04/2018   HGB 13.1 04/04/2018   HCT 39.4 04/04/2018   MCV 98.5 04/04/2018   PLT 279 04/04/2018   Recent Labs    03/07/18 1247 03/21/18 1055 04/04/18 0824  NA 138 138 138  K 3.7 3.8 3.6  CL 105 105 105  CO2 25 25 25   GLUCOSE 168* 181* 122*  BUN 6* 6* 6*  CREATININE 0.55 0.70 0.48  CALCIUM 9.1 9.4 8.8*  GFRNONAA >60 >60 >60  GFRAA >60 >60 >60  PROT 7.3 7.2 7.2  ALBUMIN 3.9 4.0 3.8  AST 23 24 17   ALT 19 16 17   ALKPHOS 106 100 93  BILITOT 0.7 0.6 0.4   RADIOGRAPHIC STUDIES: I have personally reviewed the radiological images as listed and agreed with the findings in the report.  12/20/2017 Repeat CT scan showed interval decreased of disease. CT images were Independently reviewed by me and discussed with patient. New 34mm right lung lesion, indeterminate.  02/25/2018 CT chest with contrast, presumed evolutionary changes of radiation therapy in the upper right hemithorax.  Aortic  atherosclerosis, emphysema.  ASSESSMENT & PLAN:  Cancer Staging Non-small cell cancer of right lung [facility]) Staging form: Lung, AJCC 8th Edition - Clinical stage from 09/22/2017: Stage Unknown (cTX, cN1, cM0) - Signed by Earlie Server, MD on 09/22/2017 - Pathologic: No stage assigned - Unsigned  1. Non-small cell cancer of right lung ([place])   2. Encounter for antineoplastic immunotherapy   3.  Cough   4. Tobacco abuse    #Clinical stage IIIA lung NSCLC, favoring adenocarcinoma Stable disease on most recent CT scan September 2019. Labs reviewed and discussed with patient.  Counts acceptable to proceed with today's durvalumab treatment.  No signs of immunotherapy related toxicities. Patient has so far finished 8 cycles of Durvalumab.  Tolerates well.  I discussed with her that we will plan to proceed with 1 year of durvalumab treatment maintenance.  #Patient continues to smoke a few cigarettes every day.  We discussed about smoke cessation again.  Patient is motivated to quit smoking. We spent sufficient time to discuss many aspect of care, questions were answered to patient's satisfaction. Return visits: 2 weeks  Total face to face encounter time for this patient visit was 25 min. >50% of the time was  spent in counseling and coordination of care.    Earlie Server, MD, PhD Hematology Oncology [facility] at [facility] Pager- [phone] 04/05/2018

## 2018-04-18 ENCOUNTER — Inpatient Hospital Stay: Payer: Medicare HMO

## 2018-04-18 ENCOUNTER — Encounter: Payer: Self-pay | Admitting: Oncology

## 2018-04-18 ENCOUNTER — Inpatient Hospital Stay (HOSPITAL_BASED_OUTPATIENT_CLINIC_OR_DEPARTMENT_OTHER): Payer: Medicare HMO | Admitting: Oncology

## 2018-04-18 ENCOUNTER — Other Ambulatory Visit: Payer: Self-pay

## 2018-04-18 VITALS — BP 136/83 | HR 94 | Temp 97.6°F | Wt 192.5 lb

## 2018-04-18 DIAGNOSIS — C3491 Malignant neoplasm of unspecified part of right bronchus or lung: Secondary | ICD-10-CM

## 2018-04-18 DIAGNOSIS — Z87891 Personal history of nicotine dependence: Secondary | ICD-10-CM

## 2018-04-18 DIAGNOSIS — R059 Cough, unspecified: Secondary | ICD-10-CM

## 2018-04-18 DIAGNOSIS — Z5112 Encounter for antineoplastic immunotherapy: Secondary | ICD-10-CM | POA: Diagnosis not present

## 2018-04-18 DIAGNOSIS — R05 Cough: Secondary | ICD-10-CM

## 2018-04-18 LAB — CBC WITH DIFFERENTIAL/PLATELET
ABS IMMATURE GRANULOCYTES: 0.01 10*3/uL (ref 0.00–0.07)
Basophils Absolute: 0.1 10*3/uL (ref 0.0–0.1)
Basophils Relative: 1 %
Eosinophils Absolute: 0.5 10*3/uL (ref 0.0–0.5)
Eosinophils Relative: 8 %
HCT: 39.4 % (ref 36.0–46.0)
HEMOGLOBIN: 13.1 g/dL (ref 12.0–15.0)
Immature Granulocytes: 0 %
LYMPHS PCT: 20 %
Lymphs Abs: 1.2 10*3/uL (ref 0.7–4.0)
MCH: 32.6 pg (ref 26.0–34.0)
MCHC: 33.2 g/dL (ref 30.0–36.0)
MCV: 98 fL (ref 80.0–100.0)
Monocytes Absolute: 0.4 10*3/uL (ref 0.1–1.0)
Monocytes Relative: 6 %
NEUTROS ABS: 3.9 10*3/uL (ref 1.7–7.7)
NRBC: 0 % (ref 0.0–0.2)
Neutrophils Relative %: 65 %
Platelets: 294 10*3/uL (ref 150–400)
RBC: 4.02 MIL/uL (ref 3.87–5.11)
RDW: 12.5 % (ref 11.5–15.5)
WBC: 6 10*3/uL (ref 4.0–10.5)

## 2018-04-18 LAB — COMPREHENSIVE METABOLIC PANEL
ALT: 18 U/L (ref 0–44)
ANION GAP: 6 (ref 5–15)
AST: 21 U/L (ref 15–41)
Albumin: 4 g/dL (ref 3.5–5.0)
Alkaline Phosphatase: 109 U/L (ref 38–126)
BILIRUBIN TOTAL: 0.4 mg/dL (ref 0.3–1.2)
BUN: 5 mg/dL — ABNORMAL LOW (ref 8–23)
CHLORIDE: 105 mmol/L (ref 98–111)
CO2: 26 mmol/L (ref 22–32)
Calcium: 9.1 mg/dL (ref 8.9–10.3)
Creatinine, Ser: 0.51 mg/dL (ref 0.44–1.00)
Glucose, Bld: 130 mg/dL — ABNORMAL HIGH (ref 70–99)
POTASSIUM: 3.8 mmol/L (ref 3.5–5.1)
Sodium: 137 mmol/L (ref 135–145)
TOTAL PROTEIN: 7.2 g/dL (ref 6.5–8.1)

## 2018-04-18 LAB — TSH: TSH: 1.966 u[IU]/mL (ref 0.350–4.500)

## 2018-04-18 MED ORDER — HEPARIN SOD (PORK) LOCK FLUSH 100 UNIT/ML IV SOLN
500.0000 [IU] | Freq: Once | INTRAVENOUS | Status: AC | PRN
  Administered 2018-04-18: 500 [IU]

## 2018-04-18 MED ORDER — SODIUM CHLORIDE 0.9 % IV SOLN
10.0000 mg/kg | Freq: Once | INTRAVENOUS | Status: AC
  Administered 2018-04-18: 860 mg via INTRAVENOUS
  Filled 2018-04-18: qty 7.2

## 2018-04-18 MED ORDER — SODIUM CHLORIDE 0.9 % IV SOLN
Freq: Once | INTRAVENOUS | Status: AC
  Administered 2018-04-18: 10:00:00 via INTRAVENOUS
  Filled 2018-04-18: qty 250

## 2018-04-18 MED ORDER — SODIUM CHLORIDE 0.9% FLUSH
10.0000 mL | INTRAVENOUS | Status: DC | PRN
  Administered 2018-04-18: 10 mL
  Filled 2018-04-18: qty 10

## 2018-04-18 NOTE — Progress Notes (Signed)
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   HISTORY OF PRESENTING ILLNESS:  Connie Powers is a  67 y.o.  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  CT lung cancer screen 05/27/2017   1. A few bilateral pulmonary nodules measuring up to 5 mm are indeterminate. Considering the suspicious 2.0 cm mediastinal adenopathy, follow up with PET/CT is recommended. 2. Mild bronchial wall thickening could represent airway disease including bronchitis.ACR Lung-RADS Category and Recommendation*: ACR Lung-RADS Category 2S (S modifier for mediastinal adenopathy)  PET scan 3/112019 1. No hypermetabolic pulmonary nodules.2. Hypermetabolic enlarged right paratracheal lymph node, of uncertain etiology in isolation. Lymphoproliferative disorder cannot be excluded. 3.  Aortic atherosclerosis (ICD10-170.0).  EBUS biopsy of paratracheal node showed non small cell lung cancer favoring adenocarcinoma. Case was discussed on tumor board on 09/16/2017 and consensus recommendation is to treat as Stage IIIA disease given the invasion of mediastinum.   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 MRI brain is contraindicated. CT brain negative for metastatic disease.  During the interval patient also was referred to vascular surgery for evaluation of malposition of Medi port.  Patient's right jugular port was removed and right internal jugular vein Mediport was placed by Dr.Dew on 8/5 2019.  INTERVAL HISTORY Connie Powers is a 67 y.o. female who has above history reviewed by me today presents for assessment prior to maintenance  immunotherapy for stage IIIa non-small cell lung cancer.  #Continues to do well at baseline.  Tolerate immunotherapy.  Denies any diarrhea, skin rash, shortness of breath Chronic cough is at baseline. Last visit, she admits smoking a few cigarettes every day.  Today she reports that she has quit smoking. No other complaints.  Current Treatment S/p concurrent Chemotherapy [name] /taxol  Weekly x 6] and RT.  Started on maintenance durvalumab every 2 weeks on 12/27/2017.  Review of Systems  Constitutional: Negative for chills, fever, malaise/fatigue and weight loss.  HENT: Negative for congestion, ear discharge, ear pain, hearing loss, nosebleeds, sinus pain and sore throat.   Eyes: Negative for double vision, photophobia, pain, discharge and redness.  Respiratory: Positive for cough. Negative for hemoptysis, sputum production, shortness of breath and wheezing.   Cardiovascular: Negative for chest pain, palpitations, orthopnea, claudication and leg swelling.  Gastrointestinal: Negative for abdominal pain, blood in stool, constipation, diarrhea, heartburn, melena, nausea and vomiting.  Genitourinary: Negative for dysuria, flank pain, frequency and hematuria.  Musculoskeletal: Negative for back pain, myalgias and neck pain.  Skin: Negative for itching and rash.  Neurological: Negative for dizziness, tingling, tremors, sensory change, focal weakness, weakness and headaches.  Endo/Heme/Allergies: Negative for environmental allergies. Does not bruise/bleed easily.  Psychiatric/Behavioral: Negative for depression, hallucinations and substance abuse. The patient is not nervous/anxious.     MEDICAL HISTORY:  Past Medical History:  Diagnosis Date  . Brain aneurysm   . Difficult intubation   . Hypertension   . Non-small cell cancer of right lung ([place]) 09/22/2017   Chemo + Rad tx's.   . Viral meningitis     SURGICAL HISTORY: Past Surgical History:  Procedure Laterality Date  . ENDOBRONCHIAL  ULTRASOUND N/A 09/09/2017  Procedure: ENDOBRONCHIAL ULTRASOUND;  Surgeon: Laverle Hobby, MD;  Location: [facility] ORS;  Service: Pulmonary;  Laterality: N/A;  . OOPHORECTOMY Left   . PORTA CATH INSERTION N/A 09/29/2017   Procedure: PORTA CATH INSERTION;  Surgeon: Algernon Huxley, MD;  Location: [place] [facility];  Service: Cardiovascular;  Laterality: N/A;  . PORTA CATH INSERTION N/A 01/03/2018   Procedure: PORTA CATH INSERTION;  Surgeon: Algernon Huxley, MD;  Location: [place] [facility];  Service: Cardiovascular;  Laterality: N/A;  . surgical repair for brain tumor  1969   clips in head  . WISDOM TOOTH EXTRACTION      SOCIAL HISTORY: Social History   Socioeconomic History  . Marital status: Married    Spouse name: Not on file  . Number of children: Not on file  . Years of education: Not on file  . Highest education level: Not on file  Occupational History  . Not on file  Social Needs  . Financial resource strain: Not on file  . Food insecurity:    Worry: Not on file    Inability: Not on file  . Transportation needs:    Medical: Not on file    Non-medical: Not on file  Tobacco Use  . Smoking status: Former Smoker    Packs/day: 0.25    Last attempt to quit: 10/03/2017    Years since quitting: 0.5  . Smokeless tobacco: Never Used  Substance and Sexual Activity  . Alcohol use: Not Currently    Comment: social  . Drug use: Not Currently    Types: Cocaine  . Sexual activity: Not on file  Lifestyle  . Physical activity:    Days per week: Not on file    Minutes per session: Not on file  . Stress: Not on file  Relationships  . Social connections:    Talks on phone: Not on file    Gets together: Not on file    Attends religious service: Not on file    Active member of club or organization: Not on file    Attends meetings of clubs or organizations: Not on file    Relationship status: Not on file  . Intimate partner violence:    Fear of current or ex partner: Not on file     Emotionally abused: Not on file    Physically abused: Not on file    Forced sexual activity: Not on file  Other Topics Concern  . Not on file  Social History Narrative  . Not on file    FAMILY HISTORY: Family History  Problem Relation Age of Onset  . Diabetes Mother   . Hypertension Mother   . Hyperlipidemia Mother   . Arthritis Mother   . Hypertension Father   . Hyperlipidemia Father   . Heart attack Father        x2  . Breast cancer Neg Hx     ALLERGIES:  has No Known Allergies.  MEDICATIONS:  Current Outpatient Medications  Medication Sig Dispense Refill  . amLODipine (NORVASC) 10 MG tablet Take 10 mg by mouth daily.  11  . aspirin EC 81 MG tablet Take 81 mg by mouth daily.    . Cyanocobalamin (B-12) 1000 MCG CAPS Take 1,000 mcg by mouth daily. 30 capsule 3  . docusate sodium (COLACE) 100 MG capsule Take 100 mg by mouth daily.    Marland Kitchen lidocaine-prilocaine (EMLA) cream Apply to affected area once 30 g 3  . ondansetron (ZOFRAN) 8 MG tablet Take 1  tablet (8 mg total) by mouth 2 (two) times daily as needed for refractory nausea / vomiting. Start on day 3 after chemo. 30 tablet 1  . prochlorperazine (COMPAZINE) 10 MG tablet Take 1 tablet (10 mg total) by mouth every 6 (six) hours as needed (Nausea or vomiting). 30 tablet 1  . umeclidinium-vilanterol (ANORO ELLIPTA) 62.5-25 MCG/INH AEPB Inhale 1 puff into the lungs daily as needed (shortness of breath).      No current facility-administered medications for this visit.    Facility-Administered Medications Ordered in Other Visits  Medication Dose Route Frequency Provider Last Rate Last Dose  . durvalumab (IMFINZI) 860 mg in sodium chloride 0.9 % 100 mL chemo infusion  10 mg/kg (Treatment Plan Recorded) Intravenous Once Earlie Server, MD      . heparin lock flush 100 unit/mL  500 Units Intracatheter Once PRN Earlie Server, MD      . sodium chloride flush (NS) 0.9 % injection 10 mL  10 mL Intracatheter PRN Earlie Server, MD         PHYSICAL  EXAMINATION: ECOG PERFORMANCE STATUS: 0 - Asymptomatic Vitals:   04/18/18 0920  BP: 136/83  Pulse: 94  Temp: 97.6 F (36.4 C)  SpO2: 96%   Filed Weights   04/18/18 0920  Weight: 192 lb 8 oz (87.3 kg)    Physical Exam  Constitutional: She is oriented to person, place, and time. No distress.  HENT:  Head: Normocephalic and atraumatic.  Mouth/Throat: Oropharynx is clear and moist.  Eyes: Pupils are equal, round, and reactive to light. Conjunctivae and EOM are normal. Left eye exhibits no discharge. No scleral icterus.  Neck: Normal range of motion. Neck supple.  Cardiovascular: Normal rate, regular rhythm and normal heart sounds.  No murmur heard. Pulmonary/Chest: Effort normal and breath sounds normal. No respiratory distress. She has no wheezes. She has no rales. She exhibits no tenderness.     Abdominal: Soft. Bowel sounds are normal. She exhibits no distension and no mass. There is no tenderness. There is no guarding.  Musculoskeletal: Normal range of motion. She exhibits no edema or deformity.  Lymphadenopathy:    She has no cervical adenopathy.  Neurological: She is alert and oriented to person, place, and time. No cranial nerve deficit. Coordination normal.  Skin: Skin is warm and dry. No rash noted. No erythema.  Psychiatric: She has a normal mood and affect. Her behavior is normal. Thought content normal.     LABORATORY DATA:  I have reviewed the data as listed Lab Results  Component Value Date   WBC 6.0 04/18/2018   HGB 13.1 04/18/2018   HCT 39.4 04/18/2018   MCV 98.0 04/18/2018   PLT 294 04/18/2018   Recent Labs    03/21/18 1055 04/04/18 0824 04/18/18 0907  NA 138 138 137  K 3.8 3.6 3.8  CL 105 105 105  CO2 25 25 26   GLUCOSE 181* 122* 130*  BUN 6* 6* 5*  CREATININE 0.70 0.48 0.51  CALCIUM 9.4 8.8* 9.1  GFRNONAA >60 >60 >60  GFRAA >60 >60 >60  PROT 7.2 7.2 7.2  ALBUMIN 4.0 3.8 4.0  AST 24 17 21   ALT 16 17 18   ALKPHOS 100 93 109  BILITOT 0.6  0.4 0.4   RADIOGRAPHIC STUDIES: I have personally reviewed the radiological images as listed and agreed with the findings in the report.  12/20/2017 Repeat CT scan showed interval decreased of disease. CT images were Independently reviewed by me and discussed with patient. New 83mm  right lung lesion, indeterminate.  02/25/2018 CT chest with contrast, presumed evolutionary changes of radiation therapy in the upper right hemithorax.  Aortic atherosclerosis, emphysema.  ASSESSMENT & PLAN:  Cancer Staging Non-small cell cancer of right lung [facility]) Staging form: Lung, AJCC 8th Edition - Clinical stage from 09/22/2017: Stage Unknown (cTX, cN1, cM0) - Signed by Earlie Server, MD on 09/22/2017 - Pathologic: No stage assigned - Unsigned  1. Non-small cell cancer of right lung ([place])   2. Encounter for antineoplastic immunotherapy   3. Cough    #Clinical stage IIIA lung NSCLC, favoring adenocarcinoma Stable disease on most recent CT scan in September 2019.  Labs reviewed and discussed with patient.  Counts acceptable to proceed with today's durvalumab treatment.  No clinical signs of immunotherapy related toxicity so far. TSH pending.   She recently quit smoking again, encourage patient to continue.  We spent sufficient time to discuss many aspect of care, questions were answered to patient's satisfaction. Return visits: 2 weeks Total face to face encounter time for this patient visit was 25 min. >50% of the time was  spent in counseling and coordination of care.    Earlie Server, MD, PhD Hematology Oncology [facility] at [facility] Pager- [phone] 04/18/2018

## 2018-04-18 NOTE — Progress Notes (Signed)
Patient here for follow up. States feeling fine. No concerns voiced.

## 2018-05-02 ENCOUNTER — Inpatient Hospital Stay: Payer: Medicare HMO

## 2018-05-02 ENCOUNTER — Other Ambulatory Visit: Payer: Self-pay

## 2018-05-02 ENCOUNTER — Inpatient Hospital Stay (HOSPITAL_BASED_OUTPATIENT_CLINIC_OR_DEPARTMENT_OTHER): Payer: Medicare HMO | Admitting: Oncology

## 2018-05-02 ENCOUNTER — Inpatient Hospital Stay: Payer: Medicare HMO | Attending: Oncology

## 2018-05-02 ENCOUNTER — Encounter: Payer: Self-pay | Admitting: Oncology

## 2018-05-02 VITALS — HR 88

## 2018-05-02 VITALS — BP 137/82 | HR 101 | Temp 97.2°F | Resp 18 | Wt 193.2 lb

## 2018-05-02 DIAGNOSIS — R05 Cough: Secondary | ICD-10-CM | POA: Diagnosis not present

## 2018-05-02 DIAGNOSIS — Z5112 Encounter for antineoplastic immunotherapy: Secondary | ICD-10-CM

## 2018-05-02 DIAGNOSIS — R059 Cough, unspecified: Secondary | ICD-10-CM

## 2018-05-02 DIAGNOSIS — Z79899 Other long term (current) drug therapy: Secondary | ICD-10-CM | POA: Insufficient documentation

## 2018-05-02 DIAGNOSIS — Z720 Tobacco use: Secondary | ICD-10-CM

## 2018-05-02 DIAGNOSIS — C3491 Malignant neoplasm of unspecified part of right bronchus or lung: Secondary | ICD-10-CM | POA: Diagnosis present

## 2018-05-02 LAB — CBC WITH DIFFERENTIAL/PLATELET
ABS IMMATURE GRANULOCYTES: 0.01 10*3/uL (ref 0.00–0.07)
Basophils Absolute: 0.1 10*3/uL (ref 0.0–0.1)
Basophils Relative: 1 %
EOS PCT: 5 %
Eosinophils Absolute: 0.3 10*3/uL (ref 0.0–0.5)
HEMATOCRIT: 38.8 % (ref 36.0–46.0)
HEMOGLOBIN: 13.3 g/dL (ref 12.0–15.0)
Immature Granulocytes: 0 %
LYMPHS ABS: 1.2 10*3/uL (ref 0.7–4.0)
LYMPHS PCT: 18 %
MCH: 33.9 pg (ref 26.0–34.0)
MCHC: 34.3 g/dL (ref 30.0–36.0)
MCV: 99 fL (ref 80.0–100.0)
MONO ABS: 0.4 10*3/uL (ref 0.1–1.0)
Monocytes Relative: 6 %
NEUTROS ABS: 4.6 10*3/uL (ref 1.7–7.7)
Neutrophils Relative %: 70 %
Platelets: 249 10*3/uL (ref 150–400)
RBC: 3.92 MIL/uL (ref 3.87–5.11)
RDW: 12.5 % (ref 11.5–15.5)
WBC: 6.6 10*3/uL (ref 4.0–10.5)
nRBC: 0 % (ref 0.0–0.2)

## 2018-05-02 LAB — COMPREHENSIVE METABOLIC PANEL
ALBUMIN: 4.2 g/dL (ref 3.5–5.0)
ALT: 16 U/L (ref 0–44)
AST: 20 U/L (ref 15–41)
Alkaline Phosphatase: 108 U/L (ref 38–126)
Anion gap: 6 (ref 5–15)
CHLORIDE: 105 mmol/L (ref 98–111)
CO2: 26 mmol/L (ref 22–32)
CREATININE: 0.38 mg/dL — AB (ref 0.44–1.00)
Calcium: 9.1 mg/dL (ref 8.9–10.3)
GFR calc Af Amer: >60 mL/min (ref >60)
GFR calc non Af Amer: >60 mL/min (ref >60)
GLUCOSE: 126 mg/dL — AB (ref 70–99)
POTASSIUM: 3.7 mmol/L (ref 3.5–5.1)
SODIUM: 137 mmol/L (ref 135–145)
Total Bilirubin: 0.4 mg/dL (ref 0.3–1.2)
Total Protein: 7.5 g/dL (ref 6.5–8.1)

## 2018-05-02 LAB — TSH: TSH: 1.678 u[IU]/mL (ref 0.350–4.500)

## 2018-05-02 MED ORDER — SODIUM CHLORIDE 0.9 % IV SOLN
10.0000 mg/kg | Freq: Once | INTRAVENOUS | Status: AC
  Administered 2018-05-02: 860 mg via INTRAVENOUS
  Filled 2018-05-02: qty 7.2

## 2018-05-02 MED ORDER — SODIUM CHLORIDE 0.9 % IV SOLN
Freq: Once | INTRAVENOUS | Status: AC
  Administered 2018-05-02: 09:00:00 via INTRAVENOUS
  Filled 2018-05-02: qty 250

## 2018-05-02 MED ORDER — HEPARIN SOD (PORK) LOCK FLUSH 100 UNIT/ML IV SOLN
500.0000 [IU] | Freq: Once | INTRAVENOUS | Status: AC | PRN
  Administered 2018-05-02: 500 [IU]
  Filled 2018-05-02: qty 5

## 2018-05-02 NOTE — Progress Notes (Signed)
Patient here for follow up. No concerns voiced.  °

## 2018-05-02 NOTE — Progress Notes (Signed)
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   HISTORY OF PRESENTING ILLNESS:  Connie Powers is a  67 y.o.  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  CT lung cancer screen 05/27/2017   1. A few bilateral pulmonary nodules measuring up to 5 mm are indeterminate. Considering the suspicious 2.0 cm mediastinal adenopathy, follow up with PET/CT is recommended. 2. Mild bronchial wall thickening could represent airway disease including bronchitis.ACR Lung-RADS Category and Recommendation*: ACR Lung-RADS Category 2S (S modifier for mediastinal adenopathy)  PET scan 3/112019 1. No hypermetabolic pulmonary nodules.2. Hypermetabolic enlarged right paratracheal lymph node, of uncertain etiology in isolation. Lymphoproliferative disorder cannot be excluded. 3.  Aortic atherosclerosis (ICD10-170.0).  EBUS biopsy of paratracheal node showed non small cell lung cancer favoring adenocarcinoma. Case was discussed on tumor board on 09/16/2017 and consensus recommendation is to treat as Stage IIIA disease given the invasion of mediastinum.   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 MRI brain is contraindicated. CT brain negative for metastatic disease.  During the interval patient also was referred to vascular surgery for evaluation of malposition of Medi port.  Patient's right jugular port was removed and right internal jugular vein Mediport was placed by Dr.Dew on 8/5 2019.  INTERVAL HISTORY Connie Powers is a 67 y.o. female who has above history reviewed by me today presents for assessment prior to maintenance  immunotherapy for stage IIIA non-small cell lung cancer.  Continues to do well at baseline.  Tolerated immunotherapy.  Denies any diarrhea, skin rash, shortness of breath.  Chronic cough is at baseline. No new complaints. Current Treatment S/p concurrent Chemotherapy Connie Powers /taxol  Weekly x 6] and RT.  Started on maintenance durvalumab every 2 weeks on 12/27/2017.  Review of Systems  Constitutional: Negative for appetite change, chills, fatigue and fever.  HENT:   Negative for hearing loss and voice change.   Eyes: Negative for eye problems.  Respiratory: Positive for cough. Negative for chest tightness.   Cardiovascular: Negative for chest pain.  Gastrointestinal: Negative for abdominal distention, abdominal pain and blood in stool.  Endocrine: Negative for hot flashes.  Genitourinary: Negative for difficulty urinating and frequency.   Musculoskeletal: Negative for arthralgias.  Skin: Negative for itching and rash.  Neurological: Negative for extremity weakness.  Hematological: Negative for adenopathy.  Psychiatric/Behavioral: Negative for confusion.    MEDICAL HISTORY:  Past Medical History:  Diagnosis Date  . Brain aneurysm   . Difficult intubation   . Hypertension   . Non-small cell cancer of right lung ([place]) 09/22/2017   Chemo + Rad tx's.   . Viral meningitis     SURGICAL HISTORY: Past Surgical History:  Procedure Laterality Date  . ENDOBRONCHIAL ULTRASOUND N/A 09/09/2017   Procedure: ENDOBRONCHIAL ULTRASOUND;  Surgeon: Laverle Hobby, MD;  Location: [facility] ORS;  Service: Pulmonary;  Laterality: N/A;  . OOPHORECTOMY Left   . PORTA CATH INSERTION N/A 09/29/2017   Procedure: PORTA CATH INSERTION;  Surgeon: Algernon Huxley, MD;  Location: [place] [facility];  Service: Cardiovascular;  Laterality: N/A;  . PORTA CATH INSERTION N/A 01/03/2018   Procedure: PORTA CATH INSERTION;  Surgeon: Lucky Cowboy,  Erskine Squibb, MD;  Location: [place] [facility];  Service: Cardiovascular;  Laterality:  N/A;  . surgical repair for brain tumor  1969   clips in head  . WISDOM TOOTH EXTRACTION      SOCIAL HISTORY: Social History   Socioeconomic History  . Marital status: Married    Spouse name: Not on file  . Number of children: Not on file  . Years of education: Not on file  . Highest education level: Not on file  Occupational History  . Not on file  Social Needs  . Financial resource strain: Not on file  . Food insecurity:    Worry: Not on file    Inability: Not on file  . Transportation needs:    Medical: Not on file    Non-medical: Not on file  Tobacco Use  . Smoking status: Former Smoker    Packs/day: 0.25    Last attempt to quit: 10/03/2017    Years since quitting: 0.5  . Smokeless tobacco: Never Used  Substance and Sexual Activity  . Alcohol use: Not Currently    Comment: social  . Drug use: Not Currently    Types: Cocaine  . Sexual activity: Not on file  Lifestyle  . Physical activity:    Days per week: Not on file    Minutes per session: Not on file  . Stress: Not on file  Relationships  . Social connections:    Talks on phone: Not on file    Gets together: Not on file    Attends religious service: Not on file    Active member of club or organization: Not on file    Attends meetings of clubs or organizations: Not on file    Relationship status: Not on file  . Intimate partner violence:    Fear of current or ex partner: Not on file    Emotionally abused: Not on file    Physically abused: Not on file    Forced sexual activity: Not on file  Other Topics Concern  . Not on file  Social History Narrative  . Not on file    FAMILY HISTORY: Family History  Problem Relation Age of Onset  . Diabetes Mother   . Hypertension Mother   . Hyperlipidemia Mother   . Arthritis Mother   . Hypertension Father   . Hyperlipidemia Father   . Heart attack Father        x2  . Breast cancer Neg Hx     ALLERGIES:  has No Known Allergies.  MEDICATIONS:  Current  Outpatient Medications  Medication Sig Dispense Refill  . amLODipine (NORVASC) 10 MG tablet Take 10 mg by mouth daily.  11  . aspirin EC 81 MG tablet Take 81 mg by mouth daily.    . Cyanocobalamin (B-12) 1000 MCG CAPS Take 1,000 mcg by mouth daily. 30 capsule 3  . docusate sodium (COLACE) 100 MG capsule Take 100 mg by mouth daily.    Marland Kitchen lidocaine-prilocaine (EMLA) cream Apply to affected area once 30 g 3  . ondansetron (ZOFRAN) 8 MG tablet Take 1 tablet (8 mg total) by mouth 2 (two) times daily as needed for refractory nausea / vomiting. Start on day 3 after chemo. 30 tablet 1  . prochlorperazine (COMPAZINE) 10 MG tablet Take 1 tablet (10 mg total) by mouth every 6 (six) hours as needed (Nausea or vomiting). 30 tablet 1  . umeclidinium-vilanterol (ANORO ELLIPTA) 62.5-25 MCG/INH AEPB Inhale 1 puff into the lungs daily as  needed (shortness of breath).      No current facility-administered medications for this visit.      PHYSICAL EXAMINATION: ECOG PERFORMANCE STATUS: 0 - Asymptomatic Vitals:   05/02/18 0854  BP: 137/82  Pulse: (!) 101  Resp: 18  Temp: (!) 97.2 F (36.2 C)   Filed Weights   05/02/18 0854  Weight: 193 lb 3.2 oz (87.6 kg)    Physical Exam  Constitutional: She is oriented to person, place, and time. No distress.  HENT:  Head: Normocephalic and atraumatic.  Mouth/Throat: Oropharynx is clear and moist.  Eyes: Pupils are equal, round, and reactive to light. Conjunctivae and EOM are normal. Left eye exhibits no discharge. No scleral icterus.  Neck: Normal range of motion. Neck supple.  Cardiovascular: Normal rate, regular rhythm and normal heart sounds.  No murmur heard. Pulmonary/Chest: Effort normal and breath sounds normal. No respiratory distress. She has no wheezes. She has no rales. She exhibits no tenderness.     Abdominal: Soft. Bowel sounds are normal. She exhibits no distension and no mass. There is no tenderness. There is no guarding.  Musculoskeletal:  Normal range of motion. She exhibits no edema or deformity.  Lymphadenopathy:    She has no cervical adenopathy.  Neurological: She is alert and oriented to person, place, and time. No cranial nerve deficit. Coordination normal.  Skin: Skin is warm and dry. No rash noted. No erythema.  Psychiatric: She has a normal mood and affect. Her behavior is normal. Thought content normal.     LABORATORY DATA:  I have reviewed the data as listed Lab Results  Component Value Date   WBC 6.6 05/02/2018   HGB 13.3 05/02/2018   HCT 38.8 05/02/2018   MCV 99.0 05/02/2018   PLT 249 05/02/2018   Recent Labs    04/04/18 0824 04/18/18 0907 05/02/18 0839  NA 138 137 137  K 3.6 3.8 3.7  CL 105 105 105  CO2 25 26 26   GLUCOSE 122* 130* 126*  BUN 6* 5* <5*  CREATININE 0.48 0.51 0.38*  CALCIUM 8.8* 9.1 9.1  GFRNONAA >60 >60 >60  GFRAA >60 >60 >60  PROT 7.2 7.2 7.5  ALBUMIN 3.8 4.0 4.2  AST 17 21 20   ALT 17 18 16   ALKPHOS 93 109 108  BILITOT 0.4 0.4 0.4   RADIOGRAPHIC STUDIES: I have personally reviewed the radiological images as listed and agreed with the findings in the report.  12/20/2017 Repeat CT scan showed interval decreased of disease. CT images were Independently reviewed by me and discussed with patient. New 1mm right lung lesion, indeterminate.  02/25/2018 CT chest with contrast, presumed evolutionary changes of radiation therapy in the upper right hemithorax.  Aortic atherosclerosis, emphysema.  ASSESSMENT & PLAN:  Cancer Staging Non-small cell cancer of right lung [facility]) Staging form: Lung, AJCC 8th Edition - Clinical stage from 09/22/2017: Stage Unknown (cTX, cN1, cM0) - Signed by Earlie Server, MD on 09/22/2017 - Pathologic: No stage assigned - Unsigned  1. Non-small cell cancer of right lung ([place])   2. Cough   3. Encounter for antineoplastic immunotherapy   4. Tobacco abuse    #Clinical stage IIIA lung NSCLC, favoring adenocarcinoma Stable disease on most recent CT scan in  September 2019.  Labs reviewed and discussed with patient.  Counts acceptable to proceed with today's durvalumab treatment. No clinical signs of immunotherapy related toxicity so far. TSH 1.678. Smoking cessation discussed again with patient.  She is motivated. Total face to face encounter time  for this patient visit was 25 min. >50% of the time was  spent in counseling and coordination of care.    Earlie Server, MD, PhD Hematology Oncology [facility] at [facility] Pager- [phone] 05/02/2018

## 2018-05-09 ENCOUNTER — Encounter: Payer: Self-pay | Admitting: Radiation Oncology

## 2018-05-09 ENCOUNTER — Ambulatory Visit
Admission: RE | Admit: 2018-05-09 | Discharge: 2018-05-09 | Disposition: A | Discharge status: Home / Self Care | Payer: Medicare HMO | Source: Ambulatory Visit | Attending: Radiation Oncology | Admitting: Radiation Oncology

## 2018-05-09 ENCOUNTER — Other Ambulatory Visit: Payer: Self-pay

## 2018-05-09 VITALS — BP 148/80 | HR 115 | Temp 95.9°F | Resp 20 | Wt 191.2 lb

## 2018-05-09 DIAGNOSIS — Z923 Personal history of irradiation: Secondary | ICD-10-CM | POA: Insufficient documentation

## 2018-05-09 DIAGNOSIS — F17210 Nicotine dependence, cigarettes, uncomplicated: Secondary | ICD-10-CM | POA: Insufficient documentation

## 2018-05-09 DIAGNOSIS — C3491 Malignant neoplasm of unspecified part of right bronchus or lung: Secondary | ICD-10-CM | POA: Diagnosis not present

## 2018-05-09 NOTE — Progress Notes (Signed)
Radiation Oncology Follow up Note  Name: Connie Powers   Date:   05/09/2018 MRN:  [id number] DOB: 1950-10-26    This 67 y.o. female presents to the clinic today for 5 month follow-up status post concurrent chemotherapy radiation therapy for stage IIIa (T4 N0 M0) adenocarcinoma the right lung.  REFERRING PROVIDER: Sharyne Peach, MD  HPI: patient is a 67 year old female now seen out 5 months having completed concurrent chemoradiation therapy for stage IIIa adenocarcinoma the right lung. Seen today in routine follow she is doing well. She specifically denies cough hemoptysis or chest tightness. She had a CT scan.back in September showing evolutionary changes of radiation therapy in the upper right hemithorax no evidence of resistant or progressive disease.  COMPLICATIONS OF TREATMENT: none  FOLLOW UP COMPLIANCE: keeps appointments   PHYSICAL EXAM:  BP (!) 148/80 (BP Location: Left Arm, Patient Position: Sitting)   Pulse (!) 115   Temp (!) 95.9 F (35.5 C) (Tympanic)   Resp 20   Wt 191 lb 4 oz (86.7 kg)   BMI 32.83 kg/m  Well-developed well-nourished patient in NAD. HEENT reveals PERLA, EOMI, discs not visualized.  Oral cavity is clear. No oral mucosal lesions are identified. Neck is clear without evidence of cervical or supraclavicular adenopathy. Lungs are clear to A&P. Cardiac examination is essentially unremarkable with regular rate and rhythm without murmur rub or thrill. Abdomen is benign with no organomegaly or masses noted. Motor sensory and DTR levels are equal and symmetric in the upper and lower extremities. Cranial nerves II through XII are grossly intact. Proprioception is intact. No peripheral adenopathy or edema is identified. No motor or sensory levels are noted. Crude visual fields are within normal range.  RADIOLOGY RESULTS: new CT scan reviewed and compatible with the above-stated findings PLAN: present time patient is doing well with no evidence of disease now  out 5 months. I am please were overall progress. I've asked to see her back in 6 months for follow-up. She scheduled for CT scan prior to that next visit. Patient is to call sooner with any concerns at any time.  I would like to take this opportunity to thank you for allowing me to participate in the care of your patient.Noreene Filbert, MD

## 2018-05-11 DIAGNOSIS — R825 Elevated urine levels of drugs, medicaments and biological substances: Secondary | ICD-10-CM | POA: Insufficient documentation

## 2018-05-16 ENCOUNTER — Encounter: Payer: Self-pay | Admitting: Oncology

## 2018-05-16 ENCOUNTER — Inpatient Hospital Stay (HOSPITAL_BASED_OUTPATIENT_CLINIC_OR_DEPARTMENT_OTHER): Payer: Medicare HMO | Admitting: Oncology

## 2018-05-16 ENCOUNTER — Inpatient Hospital Stay: Payer: Medicare HMO

## 2018-05-16 VITALS — BP 141/83 | HR 94 | Temp 97.6°F | Ht 64.0 in | Wt 190.3 lb

## 2018-05-16 DIAGNOSIS — C3491 Malignant neoplasm of unspecified part of right bronchus or lung: Secondary | ICD-10-CM

## 2018-05-16 DIAGNOSIS — Z720 Tobacco use: Secondary | ICD-10-CM | POA: Diagnosis not present

## 2018-05-16 DIAGNOSIS — Z5112 Encounter for antineoplastic immunotherapy: Secondary | ICD-10-CM

## 2018-05-16 LAB — COMPREHENSIVE METABOLIC PANEL
ALT: 19 U/L (ref 0–44)
AST: 22 U/L (ref 15–41)
Albumin: 4.1 g/dL (ref 3.5–5.0)
Alkaline Phosphatase: 107 U/L (ref 38–126)
Anion gap: 7 (ref 5–15)
BUN: 6 mg/dL — ABNORMAL LOW (ref 8–23)
CO2: 25 mmol/L (ref 22–32)
Calcium: 9 mg/dL (ref 8.9–10.3)
Chloride: 105 mmol/L (ref 98–111)
Creatinine, Ser: 0.45 mg/dL (ref 0.44–1.00)
GFR calc Af Amer: >60 mL/min (ref >60)
GFR calc non Af Amer: >60 mL/min (ref >60)
GLUCOSE: 135 mg/dL — AB (ref 70–99)
Potassium: 3.7 mmol/L (ref 3.5–5.1)
SODIUM: 137 mmol/L (ref 135–145)
Total Bilirubin: 0.6 mg/dL (ref 0.3–1.2)
Total Protein: 7.4 g/dL (ref 6.5–8.1)

## 2018-05-16 LAB — CBC WITH DIFFERENTIAL/PLATELET
Abs Immature Granulocytes: 0.01 10*3/uL (ref 0.00–0.07)
Basophils Absolute: 0.1 10*3/uL (ref 0.0–0.1)
Basophils Relative: 1 %
Eosinophils Absolute: 0.4 10*3/uL (ref 0.0–0.5)
Eosinophils Relative: 6 %
HCT: 40.2 % (ref 36.0–46.0)
Hemoglobin: 13.4 g/dL (ref 12.0–15.0)
IMMATURE GRANULOCYTES: 0 %
LYMPHS PCT: 23 %
Lymphs Abs: 1.4 10*3/uL (ref 0.7–4.0)
MCH: 33.2 pg (ref 26.0–34.0)
MCHC: 33.3 g/dL (ref 30.0–36.0)
MCV: 99.5 fL (ref 80.0–100.0)
Monocytes Absolute: 0.4 10*3/uL (ref 0.1–1.0)
Monocytes Relative: 7 %
Neutro Abs: 3.9 10*3/uL (ref 1.7–7.7)
Neutrophils Relative %: 63 %
Platelets: 285 10*3/uL (ref 150–400)
RBC: 4.04 MIL/uL (ref 3.87–5.11)
RDW: 12.5 % (ref 11.5–15.5)
WBC: 6.2 10*3/uL (ref 4.0–10.5)
nRBC: 0 % (ref 0.0–0.2)

## 2018-05-16 MED ORDER — HEPARIN SOD (PORK) LOCK FLUSH 100 UNIT/ML IV SOLN
500.0000 [IU] | Freq: Once | INTRAVENOUS | Status: AC
  Administered 2018-05-16: 500 [IU] via INTRAVENOUS

## 2018-05-16 MED ORDER — SODIUM CHLORIDE 0.9 % IV SOLN
Freq: Once | INTRAVENOUS | Status: AC
  Administered 2018-05-16: 10:00:00 via INTRAVENOUS
  Filled 2018-05-16: qty 250

## 2018-05-16 MED ORDER — SODIUM CHLORIDE 0.9 % IV SOLN
10.0000 mg/kg | Freq: Once | INTRAVENOUS | Status: AC
  Administered 2018-05-16: 860 mg via INTRAVENOUS
  Filled 2018-05-16: qty 17.2

## 2018-05-16 NOTE — Progress Notes (Signed)
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   HISTORY OF PRESENTING ILLNESS:  Connie Powers is a  67 y.o.  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  CT lung cancer screen 05/27/2017   1. A few bilateral pulmonary nodules measuring up to 5 mm are indeterminate. Considering the suspicious 2.0 cm mediastinal adenopathy, follow up with PET/CT is recommended. 2. Mild bronchial wall thickening could represent airway disease including bronchitis.ACR Lung-RADS Category and Recommendation*: ACR Lung-RADS Category 2S (S modifier for mediastinal adenopathy)  PET scan 3/112019 1. No hypermetabolic pulmonary nodules.2. Hypermetabolic enlarged right paratracheal lymph node, of uncertain etiology in isolation. Lymphoproliferative disorder cannot be excluded. 3.  Aortic atherosclerosis (ICD10-170.0).  EBUS biopsy of paratracheal node showed non small cell lung cancer favoring adenocarcinoma. Case was discussed on tumor board on 09/16/2017 and consensus recommendation is to treat as Stage IIIA disease given the invasion of mediastinum.   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 MRI brain is contraindicated. CT brain negative for metastatic disease.  During the interval patient also was referred to vascular surgery for evaluation of malposition of Medi port.  Patient's right jugular port was removed and right internal jugular vein Mediport was placed by Dr.Dew on 8/5 2019.  INTERVAL HISTORY Connie Powers is a 67 y.o. female who has above history reviewed by me today presents for assessment prior to maintenance  immunotherapy for stage IIIa non-small cell lung cancer. Continues to do well at baseline.  Tolerates immunotherapy.  Denies any diarrhea, skin rash, shortness of breath, chronic cough is at baseline.  She has no new complaints.   Current Treatment S/p concurrent Chemotherapy [name] /taxol  Weekly x 6] and RT.  Started on maintenance durvalumab every 2 weeks on 12/27/2017.  Review of Systems  Constitutional: Negative for appetite change, chills, fatigue and fever.  HENT:   Negative for hearing loss and voice change.   Eyes: Negative for eye problems.  Respiratory: Positive for cough. Negative for chest tightness.   Cardiovascular: Negative for chest pain.  Gastrointestinal: Negative for abdominal distention, abdominal pain and blood in stool.  Endocrine: Negative for hot flashes.  Genitourinary: Negative for difficulty urinating and frequency.   Musculoskeletal: Negative for arthralgias.  Skin: Negative for itching and rash.  Neurological: Negative for extremity weakness.  Hematological: Negative for adenopathy.  Psychiatric/Behavioral: Negative for confusion.    MEDICAL HISTORY:  Past Medical History:  Diagnosis Date  . Brain aneurysm   . Difficult intubation   . Hypertension   . Non-small cell cancer of right lung ([place]) 09/22/2017   Chemo + Rad tx's.   . Viral meningitis     SURGICAL HISTORY: Past Surgical History:  Procedure Laterality Date  . ENDOBRONCHIAL ULTRASOUND N/A 09/09/2017   Procedure: ENDOBRONCHIAL ULTRASOUND;  Surgeon: Laverle Hobby, MD;  Location: [facility] ORS;  Service: Pulmonary;  Laterality: N/A;  . OOPHORECTOMY Left   . PORTA CATH INSERTION N/A 09/29/2017   Procedure: PORTA CATH INSERTION;  Surgeon: Algernon Huxley, MD;  Location: [facility];  Service: Cardiovascular;  Laterality: N/A;  . PORTA CATH INSERTION N/A 01/03/2018   Procedure: PORTA CATH INSERTION;  Surgeon: Algernon Huxley, MD;  Location: [facility];  Service: Cardiovascular;   Laterality: N/A;  . surgical repair for brain tumor  1969   clips in head  . WISDOM TOOTH EXTRACTION      SOCIAL HISTORY: Social History   Socioeconomic History  . Marital status: Married    Spouse name: Not on file  . Number of children: Not on file  . Years of education: Not on file  . Highest education level: Not on file  Occupational History  . Not on file  Social Needs  . Financial resource strain: Not on file  . Food insecurity:    Worry: Not on file    Inability: Not on file  . Transportation needs:    Medical: Not on file    Non-medical: Not on file  Tobacco Use  . Smoking status: Former Smoker    Packs/day: 0.25    Last attempt to quit: 10/03/2017    Years since quitting: 0.6  . Smokeless tobacco: Never Used  Substance and Sexual Activity  . Alcohol use: Not Currently    Comment: social  . Drug use: Not Currently    Types: Cocaine  . Sexual activity: Not on file  Lifestyle  . Physical activity:    Days per week: Not on file    Minutes per session: Not on file  . Stress: Not on file  Relationships  . Social connections:    Talks on phone: Not on file    Gets together: Not on file    Attends religious service: Not on file    Active member of club or organization: Not on file    Attends meetings of clubs or organizations: Not on file    Relationship status: Not on file  . Intimate partner violence:    Fear of current or ex partner: Not on file    Emotionally abused: Not on file    Physically abused: Not on file    Forced sexual activity: Not on file  Other Topics Concern  . Not on file  Social History Narrative  . Not on file    FAMILY HISTORY: Family History  Problem Relation Age of Onset  . Diabetes Mother   . Hypertension Mother   . Hyperlipidemia Mother   . Arthritis Mother   . Hypertension Father   . Hyperlipidemia Father   . Heart attack Father        x2  . Breast cancer Neg Hx     ALLERGIES:  has No Known Allergies.  MEDICATIONS:   Current Outpatient Medications  Medication Sig Dispense Refill  . amLODipine (NORVASC) 10 MG tablet Take 10 mg by mouth daily.  11  . aspirin EC 81 MG tablet Take 81 mg by mouth daily.    . Cyanocobalamin (B-12) 1000 MCG CAPS Take 1,000 mcg by mouth daily. 30 capsule 3  . docusate sodium (COLACE) 100 MG capsule Take 100 mg by mouth daily.    Marland Kitchen lidocaine-prilocaine (EMLA) cream Apply to affected area once 30 g 3  . ondansetron (ZOFRAN) 8 MG tablet Take 1 tablet (8 mg total) by mouth 2 (two) times daily as needed for refractory nausea / vomiting. Start on day 3 after chemo. 30 tablet 1  . prochlorperazine (COMPAZINE) 10 MG tablet Take 1 tablet (10 mg total) by mouth every 6 (six) hours as needed (Nausea or vomiting). 30 tablet 1  . umeclidinium-vilanterol (ANORO ELLIPTA) 62.5-25 MCG/INH AEPB Inhale 1 puff into the lungs  daily as needed (shortness of breath).      No current facility-administered medications for this visit.    Facility-Administered Medications Ordered in Other Visits  Medication Dose Route Frequency Provider Last Rate Last Dose  . durvalumab (IMFINZI) 860 mg in sodium chloride 0.9 % 100 mL chemo infusion  10 mg/kg (Treatment Plan Recorded) Intravenous Once Earlie Server, MD         PHYSICAL EXAMINATION: ECOG PERFORMANCE STATUS: 0 - Asymptomatic Vitals:   05/16/18 0914  BP: (!) 141/83  Pulse: 94  Temp: 97.6 F (36.4 C)   Filed Weights   05/16/18 0914  Weight: 190 lb 5 oz (86.3 kg)    Physical Exam Constitutional:      General: She is not in acute distress. HENT:     Head: Normocephalic and atraumatic.  Eyes:     General: No scleral icterus.       Left eye: No discharge.     Conjunctiva/sclera: Conjunctivae normal.     Pupils: Pupils are equal, round, and reactive to light.  Neck:     Musculoskeletal: Normal range of motion and neck supple.  Cardiovascular:     Rate and Rhythm: Normal rate and regular rhythm.     Heart sounds: Normal heart sounds. No murmur.    Pulmonary:     Effort: Pulmonary effort is normal. No respiratory distress.     Breath sounds: Normal breath sounds. No wheezing or rales.  Chest:     Chest wall: No tenderness.  Abdominal:     General: Bowel sounds are normal. There is no distension.     Palpations: Abdomen is soft. There is no mass.     Tenderness: There is no abdominal tenderness. There is no guarding.  Musculoskeletal: Normal range of motion.        General: No deformity.  Lymphadenopathy:     Cervical: No cervical adenopathy.  Skin:    General: Skin is warm and dry.     Findings: No erythema or rash.  Neurological:     Mental Status: She is alert and oriented to person, place, and time.     Cranial Nerves: No cranial nerve deficit.     Coordination: Coordination normal.  Psychiatric:        Behavior: Behavior normal.        Thought Content: Thought content normal.      LABORATORY DATA:  I have reviewed the data as listed Lab Results  Component Value Date   WBC 6.2 05/16/2018   HGB 13.4 05/16/2018   HCT 40.2 05/16/2018   MCV 99.5 05/16/2018   PLT 285 05/16/2018   Recent Labs    04/18/18 0907 05/02/18 0839 05/16/18 0849  NA 137 137 137  K 3.8 3.7 3.7  CL 105 105 105  CO2 26 26 25   GLUCOSE 130* 126* 135*  BUN 5* <5* 6*  CREATININE 0.51 0.38* 0.45  CALCIUM 9.1 9.1 9.0  GFRNONAA >60 >60 >60  GFRAA >60 >60 >60  PROT 7.2 7.5 7.4  ALBUMIN 4.0 4.2 4.1  AST 21 20 22   ALT 18 16 19   ALKPHOS 109 108 107  BILITOT 0.4 0.4 0.6   RADIOGRAPHIC STUDIES: I have personally reviewed the radiological images as listed and agreed with the findings in the report. 12/20/2017 Repeat CT scan showed interval decreased of disease. CT images were Independently reviewed by me and discussed with patient. New 44mm right lung lesion, indeterminate.  02/25/2018 CT chest with contrast, presumed evolutionary changes of  radiation therapy in the upper right hemithorax.  Aortic atherosclerosis, emphysema.  ASSESSMENT &  PLAN:  Cancer Staging Non-small cell cancer of right lung [facility]) Staging form: Lung, AJCC 8th Edition - Clinical stage from 09/22/2017: Stage Unknown (cTX, cN1, cM0) - Signed by Earlie Server, MD on 09/22/2017 - Pathologic: No stage assigned - Unsigned  1. Non-small cell cancer of right lung ([place])   2. Encounter for antineoplastic immunotherapy    #Clinical stage IIIA lung NSCLC, favoring adenocarcinoma Stable disease on most recent CT scan in September 2019.  Labs reviewed and discussed with patient.  Counts acceptable to proceed with today's durvalumab treatment.  No signs of immunotherapy related toxicity so far. Smoking cessation discussed again with patient.  She is motivated. Plan repeat CT chest w contrast in January 2020.    Follow up in 2 weeks.  Orders Placed This Encounter  Procedures  . CT Chest W Contrast    Standing Status:   Future    Standing Expiration Date:   05/16/2019    Order Specific Question:   If indicated for the ordered procedure, I authorize the administration of contrast media per Radiology protocol    Answer:   Yes    Order Specific Question:   Preferred imaging location?    Answer:   [facility]    Order Specific Question:   Radiology Contrast Protocol - do NOT remove file path    Answer:   [email or web address]    Total face to face encounter time for this patient visit was 25 min. >50% of the time was  spent in counseling and coordination of care.   Earlie Server, MD, PhD Hematology Oncology [facility] at [facility] Pager- [phone] 05/16/2018

## 2018-05-30 ENCOUNTER — Inpatient Hospital Stay: Payer: Medicare HMO

## 2018-05-30 ENCOUNTER — Inpatient Hospital Stay (HOSPITAL_BASED_OUTPATIENT_CLINIC_OR_DEPARTMENT_OTHER): Payer: Medicare HMO | Admitting: Oncology

## 2018-05-30 ENCOUNTER — Encounter: Payer: Self-pay | Admitting: Oncology

## 2018-05-30 ENCOUNTER — Other Ambulatory Visit: Payer: Self-pay

## 2018-05-30 VITALS — BP 122/76 | HR 81 | Temp 96.4°F | Resp 18 | Wt 190.6 lb

## 2018-05-30 DIAGNOSIS — R05 Cough: Secondary | ICD-10-CM

## 2018-05-30 DIAGNOSIS — C3491 Malignant neoplasm of unspecified part of right bronchus or lung: Secondary | ICD-10-CM

## 2018-05-30 DIAGNOSIS — Z5112 Encounter for antineoplastic immunotherapy: Secondary | ICD-10-CM

## 2018-05-30 DIAGNOSIS — Z720 Tobacco use: Secondary | ICD-10-CM | POA: Diagnosis not present

## 2018-05-30 DIAGNOSIS — R059 Cough, unspecified: Secondary | ICD-10-CM

## 2018-05-30 LAB — CBC WITH DIFFERENTIAL/PLATELET
Abs Immature Granulocytes: 0.02 10*3/uL (ref 0.00–0.07)
Basophils Absolute: 0 10*3/uL (ref 0.0–0.1)
Basophils Relative: 1 %
Eosinophils Absolute: 0.4 10*3/uL (ref 0.0–0.5)
Eosinophils Relative: 7 %
HCT: 38.5 % (ref 36.0–46.0)
Hemoglobin: 12.8 g/dL (ref 12.0–15.0)
Immature Granulocytes: 0 %
Lymphocytes Relative: 21 %
Lymphs Abs: 1.1 10*3/uL (ref 0.7–4.0)
MCH: 33.4 pg (ref 26.0–34.0)
MCHC: 33.2 g/dL (ref 30.0–36.0)
MCV: 100.5 fL — ABNORMAL HIGH (ref 80.0–100.0)
Monocytes Absolute: 0.4 10*3/uL (ref 0.1–1.0)
Monocytes Relative: 7 %
NEUTROS ABS: 3.3 10*3/uL (ref 1.7–7.7)
NEUTROS PCT: 64 %
Platelets: 293 10*3/uL (ref 150–400)
RBC: 3.83 MIL/uL — ABNORMAL LOW (ref 3.87–5.11)
RDW: 12.9 % (ref 11.5–15.5)
WBC: 5.1 10*3/uL (ref 4.0–10.5)
nRBC: 0 % (ref 0.0–0.2)

## 2018-05-30 LAB — COMPREHENSIVE METABOLIC PANEL
ALK PHOS: 104 U/L (ref 38–126)
ALT: 18 U/L (ref 0–44)
ANION GAP: 8 (ref 5–15)
AST: 18 U/L (ref 15–41)
Albumin: 3.9 g/dL (ref 3.5–5.0)
BUN: 5 mg/dL — ABNORMAL LOW (ref 8–23)
CALCIUM: 8.9 mg/dL (ref 8.9–10.3)
CO2: 27 mmol/L (ref 22–32)
Chloride: 106 mmol/L (ref 98–111)
Creatinine, Ser: 0.5 mg/dL (ref 0.44–1.00)
GFR calc Af Amer: >60 mL/min (ref >60)
GFR calc non Af Amer: >60 mL/min (ref >60)
Glucose, Bld: 118 mg/dL — ABNORMAL HIGH (ref 70–99)
POTASSIUM: 3.7 mmol/L (ref 3.5–5.1)
Sodium: 141 mmol/L (ref 135–145)
TOTAL PROTEIN: 7.1 g/dL (ref 6.5–8.1)
Total Bilirubin: 0.5 mg/dL (ref 0.3–1.2)

## 2018-05-30 LAB — TSH: TSH: 1.697 u[IU]/mL (ref 0.350–4.500)

## 2018-05-30 MED ORDER — HEPARIN SOD (PORK) LOCK FLUSH 100 UNIT/ML IV SOLN
INTRAVENOUS | Status: AC
  Filled 2018-05-30: qty 5

## 2018-05-30 MED ORDER — SODIUM CHLORIDE 0.9 % IV SOLN
Freq: Once | INTRAVENOUS | Status: AC
  Administered 2018-05-30: 11:00:00 via INTRAVENOUS
  Filled 2018-05-30: qty 250

## 2018-05-30 MED ORDER — HEPARIN SOD (PORK) LOCK FLUSH 100 UNIT/ML IV SOLN
500.0000 [IU] | Freq: Once | INTRAVENOUS | Status: AC | PRN
  Administered 2018-05-30: 500 [IU]

## 2018-05-30 MED ORDER — SODIUM CHLORIDE 0.9 % IV SOLN
10.0000 mg/kg | Freq: Once | INTRAVENOUS | Status: AC
  Administered 2018-05-30: 860 mg via INTRAVENOUS
  Filled 2018-05-30: qty 17.2

## 2018-05-30 NOTE — Progress Notes (Signed)
Patient here for follow up. No concerns voiced.  °

## 2018-05-30 NOTE — Progress Notes (Signed)
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   HISTORY OF PRESENTING ILLNESS:  Connie Powers is a  67 y.o.  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  CT lung cancer screen 05/27/2017   1. A few bilateral pulmonary nodules measuring up to 5 mm are indeterminate. Considering the suspicious 2.0 cm mediastinal adenopathy, follow up with PET/CT is recommended. 2. Mild bronchial wall thickening could represent airway disease including bronchitis.ACR Lung-RADS Category and Recommendation*: ACR Lung-RADS Category 2S (S modifier for mediastinal adenopathy)  PET scan 3/112019 1. No hypermetabolic pulmonary nodules.2. Hypermetabolic enlarged right paratracheal lymph node, of uncertain etiology in isolation. Lymphoproliferative disorder cannot be excluded. 3.  Aortic atherosclerosis (ICD10-170.0).  EBUS biopsy of paratracheal node showed non small cell lung cancer favoring adenocarcinoma. Case was discussed on tumor board on 09/16/2017 and consensus recommendation is to treat as Stage IIIA disease given the invasion of mediastinum.   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 MRI brain is contraindicated. CT brain negative for metastatic disease.  During the interval patient also was referred to vascular surgery for evaluation of malposition of Medi port.  Patient's right jugular port was removed and right internal jugular vein Mediport was placed by Dr.Dew on 8/5 2019.  INTERVAL HISTORY Connie Powers is a 67 y.o. female who has above history reviewed by me today presents for assessment prior to maintenance  immunotherapy for stage IIIa non-small cell lung cancer.  She continues to do well at baseline.  Tolerates immunotherapy very well.  Denies any diarrhea, skin rash, shortness of breath, chronic cough is at baseline.  She has no new complaints today. Continues to smoke intermittently.  Current Treatment S/p concurrent Chemotherapy [name] /taxol  Weekly x 6] and RT.  Started on maintenance durvalumab every 2 weeks on 12/27/2017.  Review of Systems  Constitutional: Negative for appetite change, chills, fatigue and fever.  HENT:   Negative for hearing loss and voice change.   Eyes: Negative for eye problems.  Respiratory: Positive for cough. Negative for chest tightness.   Cardiovascular: Negative for chest pain.  Gastrointestinal: Negative for abdominal distention, abdominal pain and blood in stool.  Endocrine: Negative for hot flashes.  Genitourinary: Negative for difficulty urinating and frequency.   Musculoskeletal: Negative for arthralgias.  Skin: Negative for itching and rash.  Neurological: Negative for extremity weakness.  Hematological: Negative for adenopathy.  Psychiatric/Behavioral: Negative for confusion.    MEDICAL HISTORY:  Past Medical History:  Diagnosis Date  . Brain aneurysm   . Difficult intubation   . Hypertension   . Non-small cell cancer of right lung ([place]) 09/22/2017   Chemo + Rad tx's.   . Viral meningitis     SURGICAL HISTORY: Past Surgical History:  Procedure Laterality Date  . ENDOBRONCHIAL ULTRASOUND N/A 09/09/2017   Procedure: ENDOBRONCHIAL ULTRASOUND;  Surgeon: Laverle Hobby, MD;  Location: [facility] ORS;  Service: Pulmonary;  Laterality: N/A;  . OOPHORECTOMY Left   . PORTA CATH INSERTION N/A 09/29/2017   Procedure: PORTA CATH INSERTION;  Surgeon: Algernon Huxley, MD;  Location: [place] [facility];  Service: Cardiovascular;  Laterality: N/A;  . PORTA CATH INSERTION  N/A 01/03/2018   Procedure: PORTA CATH INSERTION;  Surgeon: Algernon Huxley, MD;   Location: [place] [facility];  Service: Cardiovascular;  Laterality: N/A;  . surgical repair for brain tumor  1969   clips in head  . WISDOM TOOTH EXTRACTION      SOCIAL HISTORY: Social History   Socioeconomic History  . Marital status: Married    Spouse name: Not on file  . Number of children: Not on file  . Years of education: Not on file  . Highest education level: Not on file  Occupational History  . Not on file  Social Needs  . Financial resource strain: Not on file  . Food insecurity:    Worry: Not on file    Inability: Not on file  . Transportation needs:    Medical: Not on file    Non-medical: Not on file  Tobacco Use  . Smoking status: Former Smoker    Packs/day: 0.25    Last attempt to quit: 10/03/2017    Years since quitting: 0.6  . Smokeless tobacco: Never Used  Substance and Sexual Activity  . Alcohol use: Not Currently    Comment: social  . Drug use: Not Currently    Types: Cocaine  . Sexual activity: Not on file  Lifestyle  . Physical activity:    Days per week: Not on file    Minutes per session: Not on file  . Stress: Not on file  Relationships  . Social connections:    Talks on phone: Not on file    Gets together: Not on file    Attends religious service: Not on file    Active member of club or organization: Not on file    Attends meetings of clubs or organizations: Not on file    Relationship status: Not on file  . Intimate partner violence:    Fear of current or ex partner: Not on file    Emotionally abused: Not on file    Physically abused: Not on file    Forced sexual activity: Not on file  Other Topics Concern  . Not on file  Social History Narrative  . Not on file    FAMILY HISTORY: Family History  Problem Relation Age of Onset  . Diabetes Mother   . Hypertension Mother   . Hyperlipidemia Mother   . Arthritis Mother   . Hypertension Father   . Hyperlipidemia Father   . Heart attack Father        x2  . Breast cancer Neg Hx      ALLERGIES:  has No Known Allergies.  MEDICATIONS:  Current Outpatient Medications  Medication Sig Dispense Refill  . amLODipine (NORVASC) 10 MG tablet Take 10 mg by mouth daily.  11  . aspirin EC 81 MG tablet Take 81 mg by mouth daily.    . Cyanocobalamin (B-12) 1000 MCG CAPS Take 1,000 mcg by mouth daily. 30 capsule 3  . docusate sodium (COLACE) 100 MG capsule Take 100 mg by mouth daily.    Marland Kitchen lidocaine-prilocaine (EMLA) cream Apply to affected area once 30 g 3  . ondansetron (ZOFRAN) 8 MG tablet Take 1 tablet (8 mg total) by mouth 2 (two) times daily as needed for refractory nausea / vomiting. Start on day 3 after chemo. 30 tablet 1  . prochlorperazine (COMPAZINE) 10 MG tablet Take 1 tablet (10 mg total) by mouth every 6 (six) hours as needed (Nausea or vomiting). 30 tablet 1  . umeclidinium-vilanterol (ANORO ELLIPTA)  62.5-25 MCG/INH AEPB Inhale 1 puff into the lungs daily as needed (shortness of breath).      No current facility-administered medications for this visit.      PHYSICAL EXAMINATION: ECOG PERFORMANCE STATUS: 0 - Asymptomatic Vitals:   05/30/18 0920  BP: 122/76  Pulse: 81  Resp: 18  Temp: (!) 96.4 F (35.8 C)  SpO2: 97%   Filed Weights   05/30/18 0920  Weight: 190 lb 9.6 oz (86.5 kg)    Physical Exam Constitutional:      General: She is not in acute distress. HENT:     Head: Normocephalic and atraumatic.  Eyes:     General: No scleral icterus.       Left eye: No discharge.     Conjunctiva/sclera: Conjunctivae normal.     Pupils: Pupils are equal, round, and reactive to light.  Neck:     Musculoskeletal: Normal range of motion and neck supple.  Cardiovascular:     Rate and Rhythm: Normal rate and regular rhythm.     Heart sounds: Normal heart sounds. No murmur.  Pulmonary:     Effort: Pulmonary effort is normal. No respiratory distress.     Breath sounds: Normal breath sounds. No wheezing or rales.  Chest:     Chest wall: No tenderness.    Abdominal:     General: Bowel sounds are normal. There is no distension.     Palpations: Abdomen is soft. There is no mass.     Tenderness: There is no abdominal tenderness. There is no guarding.  Musculoskeletal: Normal range of motion.        General: No deformity.  Lymphadenopathy:     Cervical: No cervical adenopathy.  Skin:    General: Skin is warm and dry.     Findings: No erythema or rash.  Neurological:     Mental Status: She is alert and oriented to person, place, and time.     Cranial Nerves: No cranial nerve deficit.     Coordination: Coordination normal.  Psychiatric:        Behavior: Behavior normal.        Thought Content: Thought content normal.      LABORATORY DATA:  I have reviewed the data as listed Lab Results  Component Value Date   WBC 5.1 05/30/2018   HGB 12.8 05/30/2018   HCT 38.5 05/30/2018   MCV 100.5 (H) 05/30/2018   PLT 293 05/30/2018   Recent Labs    05/02/18 0839 05/16/18 0849 05/30/18 0900  NA 137 137 141  K 3.7 3.7 3.7  CL 105 105 106  CO2 26 25 27   GLUCOSE 126* 135* 118*  BUN <5* 6* 5*  CREATININE 0.38* 0.45 0.50  CALCIUM 9.1 9.0 8.9  GFRNONAA >60 >60 >60  GFRAA >60 >60 >60  PROT 7.5 7.4 7.1  ALBUMIN 4.2 4.1 3.9  AST 20 22 18   ALT 16 19 18   ALKPHOS 108 107 104  BILITOT 0.4 0.6 0.5   RADIOGRAPHIC STUDIES: I have personally reviewed the radiological images as listed and agreed with the findings in the report. 12/20/2017 Repeat CT scan showed interval decreased of disease. CT images were Independently reviewed by me and discussed with patient. New 8mm right lung lesion, indeterminate.  02/25/2018 CT chest with contrast, presumed evolutionary changes of radiation therapy in the upper right hemithorax.  Aortic atherosclerosis, emphysema.  ASSESSMENT & PLAN:  Cancer Staging Non-small cell cancer of right lung ([place]) Staging form: Lung, AJCC 8th Edition -  Clinical stage from 09/22/2017: Stage Unknown (cTX, cN1, cM0) - Signed by Earlie Server, MD on 09/22/2017 - Pathologic: No stage assigned - Unsigned  1. Encounter for antineoplastic immunotherapy   2. Non-small cell cancer of right lung ([place])   3. Tobacco abuse    #Clinical stage IIIA lung NSCLC, favoring adenocarcinoma Stable disease on most recent CT scan in September 2019.  Labs reviewed and discussed with patient.  Counts acceptable to proceed with today's durvalumab treatment.  No signs of immunotherapy related toxicity so far. Smoking cessation was discussed again with patient.  She continues to smoke intermittently. Repeat CT chest for surveillance in January 2020.  Scheduled.   Follow up in 2 weeks.  Total face to face encounter time for this patient visit was 25 min. >50% of the time was  spent in counseling and coordination of care.   Earlie Server, MD, PhD Hematology Oncology [facility] Pager- [phone] 05/30/2018

## 2018-06-02 ENCOUNTER — Telehealth: Payer: Self-pay | Admitting: *Deleted

## 2018-06-02 ENCOUNTER — Other Ambulatory Visit: Payer: Self-pay | Admitting: Oncology

## 2018-06-02 ENCOUNTER — Ambulatory Visit
Admission: RE | Admit: 2018-06-02 | Discharge: 2018-06-02 | Disposition: A | Discharge status: Home / Self Care | Payer: Medicare HMO | Source: Ambulatory Visit | Attending: Oncology | Admitting: Oncology

## 2018-06-02 DIAGNOSIS — I81 Portal vein thrombosis: Secondary | ICD-10-CM

## 2018-06-02 DIAGNOSIS — C3491 Malignant neoplasm of unspecified part of right bronchus or lung: Secondary | ICD-10-CM | POA: Diagnosis not present

## 2018-06-02 DIAGNOSIS — Z95828 Presence of other vascular implants and grafts: Secondary | ICD-10-CM

## 2018-06-02 DIAGNOSIS — I8290 Acute embolism and thrombosis of unspecified vein: Secondary | ICD-10-CM

## 2018-06-02 MED ORDER — IOPAMIDOL (ISOVUE-300) INJECTION 61%
75.0000 mL | Freq: Once | INTRAVENOUS | Status: AC | PRN
  Administered 2018-06-02: 75 mL via INTRAVENOUS

## 2018-06-02 NOTE — Telephone Encounter (Signed)
Called report: IMPRESSION: Radiation changes in the medial right upper lobe/paramediastinal region.  No evidence of recurrent or metastatic disease.  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 Consider right upper extremity venous Doppler ultrasound for further evaluation, as clinically warranted.  These results will be called to the ordering clinician or representative by the Radiologist Assistant, and communication documented in the PACS or zVision Dashboard.  Emphysema (ICD10-J43.9).   Electronically Signed   By: Julian Hy M.D.   On: 06/02/2018 09:00

## 2018-06-02 NOTE — Telephone Encounter (Signed)
Please arrange right upper extremity venous Doppler ultrasound. Thanks.

## 2018-06-02 NOTE — Telephone Encounter (Signed)
I will call her and send a message to schedulers to schedule

## 2018-06-06 ENCOUNTER — Ambulatory Visit
Admission: RE | Admit: 2018-06-06 | Discharge: 2018-06-06 | Disposition: A | Discharge status: Home / Self Care | Payer: Medicare HMO | Source: Ambulatory Visit | Attending: Oncology | Admitting: Oncology

## 2018-06-06 ENCOUNTER — Other Ambulatory Visit: Payer: Self-pay | Admitting: Oncology

## 2018-06-06 ENCOUNTER — Telehealth: Payer: Self-pay | Admitting: *Deleted

## 2018-06-06 DIAGNOSIS — I8290 Acute embolism and thrombosis of unspecified vein: Secondary | ICD-10-CM | POA: Insufficient documentation

## 2018-06-06 MED ORDER — ELIQUIS 5 MG VTE STARTER PACK: ORAL_TABLET | ORAL | 0 refills | Status: DC

## 2018-06-06 NOTE — Telephone Encounter (Signed)
Called report CLINICAL DATA:  Nonocclusive thrombus noted on CT dated 06/02/2018.  EXAM: RIGHT UPPER EXTREMITY VENOUS DUPLEX ULTRASOUND  TECHNIQUE: Doppler venous assessment of the right upper extremity deep venous system was performed, including characterization of spectral flow, compressibility, and phasicity.  COMPARISON:  None.  FINDINGS: There is nonocclusive thrombus in the right jugular vein. The port catheter can be seen associated with the thrombus. The jugular vein is partially occlusive. There is also partially occlusive thrombus in the right subclavian vein. The right axillary, brachial, radial, and ulnar veins are compressible. Doppler analysis demonstrates phasicity in the Doppler waveform.  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   Electronically Signed   By: Marybelle Killings M.D.   On: 06/06/2018 14:30

## 2018-06-06 NOTE — Telephone Encounter (Signed)
Dr Tasia Catchings has sent Rx for Eliquis to pharmacy.  Attempted to call pt to notify her, no answer & unable to leave vm.

## 2018-06-13 ENCOUNTER — Encounter: Payer: Self-pay | Admitting: Oncology

## 2018-06-13 ENCOUNTER — Inpatient Hospital Stay: Payer: Medicare HMO

## 2018-06-13 ENCOUNTER — Inpatient Hospital Stay: Payer: Medicare HMO | Attending: Oncology

## 2018-06-13 ENCOUNTER — Encounter (INDEPENDENT_AMBULATORY_CARE_PROVIDER_SITE_OTHER): Payer: Self-pay

## 2018-06-13 ENCOUNTER — Other Ambulatory Visit: Payer: Self-pay

## 2018-06-13 ENCOUNTER — Inpatient Hospital Stay (HOSPITAL_BASED_OUTPATIENT_CLINIC_OR_DEPARTMENT_OTHER): Payer: Medicare HMO | Admitting: Oncology

## 2018-06-13 VITALS — BP 137/84 | HR 85 | Temp 96.5°F | Resp 18 | Wt 190.6 lb

## 2018-06-13 DIAGNOSIS — Z95828 Presence of other vascular implants and grafts: Secondary | ICD-10-CM

## 2018-06-13 DIAGNOSIS — Z7901 Long term (current) use of anticoagulants: Secondary | ICD-10-CM | POA: Diagnosis not present

## 2018-06-13 DIAGNOSIS — C3491 Malignant neoplasm of unspecified part of right bronchus or lung: Secondary | ICD-10-CM | POA: Diagnosis present

## 2018-06-13 DIAGNOSIS — R05 Cough: Secondary | ICD-10-CM | POA: Diagnosis not present

## 2018-06-13 DIAGNOSIS — Z5112 Encounter for antineoplastic immunotherapy: Secondary | ICD-10-CM

## 2018-06-13 DIAGNOSIS — Z720 Tobacco use: Secondary | ICD-10-CM

## 2018-06-13 DIAGNOSIS — Z79899 Other long term (current) drug therapy: Secondary | ICD-10-CM | POA: Insufficient documentation

## 2018-06-13 DIAGNOSIS — I81 Portal vein thrombosis: Secondary | ICD-10-CM | POA: Diagnosis not present

## 2018-06-13 DIAGNOSIS — I82621 Acute embolism and thrombosis of deep veins of right upper extremity: Secondary | ICD-10-CM

## 2018-06-13 LAB — CBC WITH DIFFERENTIAL/PLATELET
Abs Immature Granulocytes: 0.01 10*3/uL (ref 0.00–0.07)
Basophils Absolute: 0.1 10*3/uL (ref 0.0–0.1)
Basophils Relative: 1 %
Eosinophils Absolute: 0.3 10*3/uL (ref 0.0–0.5)
Eosinophils Relative: 5 %
HCT: 38.9 % (ref 36.0–46.0)
Hemoglobin: 13 g/dL (ref 12.0–15.0)
Immature Granulocytes: 0 %
Lymphocytes Relative: 19 %
Lymphs Abs: 1.2 10*3/uL (ref 0.7–4.0)
MCH: 33.4 pg (ref 26.0–34.0)
MCHC: 33.4 g/dL (ref 30.0–36.0)
MCV: 100 fL (ref 80.0–100.0)
MONO ABS: 0.4 10*3/uL (ref 0.1–1.0)
Monocytes Relative: 6 %
Neutro Abs: 4.5 10*3/uL (ref 1.7–7.7)
Neutrophils Relative %: 69 %
Platelets: 291 10*3/uL (ref 150–400)
RBC: 3.89 MIL/uL (ref 3.87–5.11)
RDW: 12.7 % (ref 11.5–15.5)
WBC: 6.6 10*3/uL (ref 4.0–10.5)
nRBC: 0 % (ref 0.0–0.2)

## 2018-06-13 LAB — COMPREHENSIVE METABOLIC PANEL
ALT: 23 U/L (ref 0–44)
AST: 18 U/L (ref 15–41)
Albumin: 4.2 g/dL (ref 3.5–5.0)
Alkaline Phosphatase: 110 U/L (ref 38–126)
Anion gap: 7 (ref 5–15)
BUN: 7 mg/dL — AB (ref 8–23)
CO2: 27 mmol/L (ref 22–32)
Calcium: 9.3 mg/dL (ref 8.9–10.3)
Chloride: 105 mmol/L (ref 98–111)
Creatinine, Ser: 0.52 mg/dL (ref 0.44–1.00)
GFR calc Af Amer: >60 mL/min (ref >60)
GFR calc non Af Amer: >60 mL/min (ref >60)
Glucose, Bld: 154 mg/dL — ABNORMAL HIGH (ref 70–99)
Potassium: 3.6 mmol/L (ref 3.5–5.1)
Sodium: 139 mmol/L (ref 135–145)
TOTAL PROTEIN: 7.6 g/dL (ref 6.5–8.1)
Total Bilirubin: 0.7 mg/dL (ref 0.3–1.2)

## 2018-06-13 MED ORDER — SODIUM CHLORIDE 0.9% FLUSH
10.0000 mL | INTRAVENOUS | Status: DC | PRN
  Administered 2018-06-13: 10 mL via INTRAVENOUS
  Filled 2018-06-13: qty 10

## 2018-06-13 MED ORDER — SODIUM CHLORIDE 0.9 % IV SOLN
Freq: Once | INTRAVENOUS | Status: AC
  Administered 2018-06-13: 10:00:00 via INTRAVENOUS
  Filled 2018-06-13: qty 250

## 2018-06-13 MED ORDER — HEPARIN SOD (PORK) LOCK FLUSH 100 UNIT/ML IV SOLN: 500.0000 [IU] | Freq: Once | INTRAVENOUS | Status: DC | PRN

## 2018-06-13 MED ORDER — HEPARIN SOD (PORK) LOCK FLUSH 100 UNIT/ML IV SOLN
500.0000 [IU] | Freq: Once | INTRAVENOUS | Status: AC
  Administered 2018-06-13: 500 [IU] via INTRAVENOUS
  Filled 2018-06-13: qty 5

## 2018-06-13 MED ORDER — SODIUM CHLORIDE 0.9 % IV SOLN
10.0000 mg/kg | Freq: Once | INTRAVENOUS | Status: AC
  Administered 2018-06-13: 860 mg via INTRAVENOUS
  Filled 2018-06-13: qty 17.2

## 2018-06-13 NOTE — Progress Notes (Signed)
Patient here for follow up. No concerns voiced.  °

## 2018-06-13 NOTE — Progress Notes (Signed)
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   HISTORY OF PRESENTING ILLNESS:  Connie Powers is a  68 y.o.  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  CT lung cancer screen 05/27/2017   1. A few bilateral pulmonary nodules measuring up to 5 mm are indeterminate. Considering the suspicious 2.0 cm mediastinal adenopathy, follow up with PET/CT is recommended. 2. Mild bronchial wall thickening could represent airway disease including bronchitis.ACR Lung-RADS Category and Recommendation*: ACR Lung-RADS Category 2S (S modifier for mediastinal adenopathy)  PET scan 3/112019 1. No hypermetabolic pulmonary nodules.2. Hypermetabolic enlarged right paratracheal lymph node, of uncertain etiology in isolation. Lymphoproliferative disorder cannot be excluded. 3.  Aortic atherosclerosis (ICD10-170.0).  EBUS biopsy of paratracheal node showed non small cell lung cancer favoring adenocarcinoma. Case was discussed on tumor board on 09/16/2017 and consensus recommendation is to treat as Stage IIIA disease given the invasion of mediastinum.   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 MRI brain is contraindicated. CT brain negative for metastatic disease.  During the interval patient also was referred to vascular surgery for evaluation of malposition of Medi port.  Patient's right jugular port was removed and right internal jugular vein Mediport was placed by Dr.Dew on 8/5 2019.  INTERVAL HISTORY Connie Powers is a 68 y.o. female who has above history reviewed by me today presents for assessment prior to maintenance  immunotherapy for stage IIIa non-small cell lung cancer.  #During the interval patient had surveillance CT chest scan done 06/02/2018 which was independent reviewed by me. CT chest showed radiation changes in the medial right upper lobe/paramediastinal region.  No evidence of recurrent or metastatic disease. Nonocclusive pericatheter thrombus/fibrin sheath along patient's right chest port. 06/06/2018 US venous upper right extremity ultrasound showed nonocclusive thrombus in the right jugular vein.  The port catheter can be seen associated with this thrombus.  The jugular vein is partially occlusive.  There is also partially occlusive thrombus in the right subclavian vein.  The right axillary, brachial, radial, and ulnar veins are compressible.  Doppler analysis demonstrated phasicity of the Doppler waveform. Patient was started on Eliquis for anticoagulation.  Patient reports tolerating Eliquis 10 mg twice daily well.  She knows that she will finish total of 1 week of 10 mg twice daily and start 5 mg twice daily afterwards.  Denies hematochezia, hematuria, hematemesis, epistaxis, black tarry stool or easy bruising.   Continues to do well at baseline.  Tolerates immunotherapy very well.  Denies any diarrhea, skin rash, shortness of breath, chronic cough is at baseline.  She has no new complaints.  Continues smoke intermittently   Current Treatment S/p concurrent Chemotherapy [name] /taxol  Weekly x 6] and RT.  Started on maintenance durvalumab every 2 weeks on 12/27/2017. Eliquis started 06/07/2018.  Review of Systems  Constitutional: Negative for appetite change, chills, fatigue and fever.  HENT:   Negative for hearing loss and voice change.   Eyes: Negative for eye problems.  Respiratory: Positive for cough. Negative for chest tightness.   Cardiovascular: Negative for chest pain.  Gastrointestinal: Negative for abdominal distention, abdominal pain and blood in stool.  Endocrine: Negative  for hot  flashes.  Genitourinary: Negative for difficulty urinating and frequency.   Musculoskeletal: Negative for arthralgias.  Skin: Negative for itching and rash.  Neurological: Negative for extremity weakness.  Hematological: Negative for adenopathy.  Psychiatric/Behavioral: Negative for confusion.    MEDICAL HISTORY:  Past Medical History:  Diagnosis Date  . Brain aneurysm   . Difficult intubation   . Hypertension   . Non-small cell cancer of right lung ([place]) 09/22/2017   Chemo + Rad tx's.   . Viral meningitis     SURGICAL HISTORY: Past Surgical History:  Procedure Laterality Date  . ENDOBRONCHIAL ULTRASOUND N/A 09/09/2017   Procedure: ENDOBRONCHIAL ULTRASOUND;  Surgeon: Laverle Hobby, MD;  Location: [facility];  Service: Pulmonary;  Laterality: N/A;  . OOPHORECTOMY Left   . PORTA CATH INSERTION N/A 09/29/2017   Procedure: PORTA CATH INSERTION;  Surgeon: Algernon Huxley, MD;  Location: [place] [facility];  Service: Cardiovascular;  Laterality: N/A;  . PORTA CATH INSERTION N/A 01/03/2018   Procedure: PORTA CATH INSERTION;  Surgeon: Algernon Huxley, MD;  Location: [place] [facility];  Service: Cardiovascular;  Laterality: N/A;  . surgical repair for brain tumor  1969   clips in head  . WISDOM TOOTH EXTRACTION      SOCIAL HISTORY: Social History   Socioeconomic History  . Marital status: Married    Spouse name: Not on file  . Number of children: Not on file  . Years of education: Not on file  . Highest education level: Not on file  Occupational History  . Not on file  Social Needs  . Financial resource strain: Not on file  . Food insecurity:    Worry: Not on file    Inability: Not on file  . Transportation needs:    Medical: Not on file    Non-medical: Not on file  Tobacco Use  . Smoking status: Former Smoker    Packs/day: 0.25    Last attempt to quit: 10/03/2017    Years since quitting: 0.6  . Smokeless tobacco: Never Used  Substance and Sexual Activity  . Alcohol  use: Not Currently    Comment: social  . Drug use: Not Currently    Types: Cocaine  . Sexual activity: Not on file  Lifestyle  . Physical activity:    Days per week: Not on file    Minutes per session: Not on file  . Stress: Not on file  Relationships  . Social connections:    Talks on phone: Not on file    Gets together: Not on file    Attends religious service: Not on file    Active member of club or organization: Not on file    Attends meetings of clubs or organizations: Not on file    Relationship status: Not on file  . Intimate partner violence:    Fear of current or ex partner: Not on file    Emotionally abused: Not on file    Physically abused: Not on file    Forced sexual activity: Not on file  Other Topics Concern  . Not on file  Social History Narrative  . Not on file    FAMILY HISTORY: Family History  Problem Relation Age of Onset  . Diabetes Mother   . Hypertension Mother   . Hyperlipidemia Mother   . Arthritis Mother   . Hypertension Father   . Hyperlipidemia Father   . Heart attack Father        x2  .  Breast cancer Neg Hx     ALLERGIES:  has No Known Allergies.  MEDICATIONS:  Current Outpatient Medications  Medication Sig Dispense Refill  . amLODipine (NORVASC) 10 MG tablet Take 10 mg by mouth daily.  11  . Cyanocobalamin (B-12) 1000 MCG CAPS Take 1,000 mcg by mouth daily. 30 capsule 3  . docusate sodium (COLACE) 100 MG capsule Take 100 mg by mouth daily.    Marland Kitchen ELIQUIS STARTER PACK (ELIQUIS STARTER PACK) 5 MG TABS Take as directed on package: start with two-5mg  tablets twice daily for 7 days. On day 8, switch to one-5mg  tablet twice daily. 1 each 0  . lidocaine-prilocaine (EMLA) cream Apply to affected area once 30 g 3  . ondansetron (ZOFRAN) 8 MG tablet Take 1 tablet (8 mg total) by mouth 2 (two) times daily as needed for refractory nausea / vomiting. Start on day 3 after chemo. 30 tablet 1  . prochlorperazine (COMPAZINE) 10 MG tablet Take 1 tablet  (10 mg total) by mouth every 6 (six) hours as needed (Nausea or vomiting). 30 tablet 1  . umeclidinium-vilanterol (ANORO ELLIPTA) 62.5-25 MCG/INH AEPB Inhale 1 puff into the lungs daily as needed (shortness of breath).      No current facility-administered medications for this visit.      PHYSICAL EXAMINATION: ECOG PERFORMANCE STATUS: 0 - Asymptomatic Vitals:   06/13/18 0923  BP: 137/84  Pulse: 85  Resp: 18  Temp: (!) 96.5 F (35.8 C)  SpO2: 96%   Filed Weights   06/13/18 0923  Weight: 190 lb 9.6 oz (86.5 kg)    Physical Exam Constitutional:      General: She is not in acute distress. HENT:     Head: Normocephalic and atraumatic.  Eyes:     General: No scleral icterus.       Left eye: No discharge.     Conjunctiva/sclera: Conjunctivae normal.     Pupils: Pupils are equal, round, and reactive to light.  Neck:     Musculoskeletal: Normal range of motion and neck supple.  Cardiovascular:     Rate and Rhythm: Normal rate and regular rhythm.     Heart sounds: Normal heart sounds. No murmur.  Pulmonary:     Effort: Pulmonary effort is normal. No respiratory distress.     Breath sounds: Normal breath sounds. No wheezing or rales.  Chest:     Chest wall: No tenderness.  Abdominal:     General: Bowel sounds are normal. There is no distension.     Palpations: Abdomen is soft. There is no mass.     Tenderness: There is no abdominal tenderness. There is no guarding.  Musculoskeletal: Normal range of motion.        General: No deformity.  Lymphadenopathy:     Cervical: No cervical adenopathy.  Skin:    General: Skin is warm and dry.     Findings: No erythema or rash.     Comments: Right anterior chest wall + Mediport  Neurological:     Mental Status: She is alert and oriented to person, place, and time.     Cranial Nerves: No cranial nerve deficit.     Coordination: Coordination normal.  Psychiatric:        Behavior: Behavior normal.        Thought Content: Thought  content normal.      LABORATORY DATA:  I have reviewed the data as listed Lab Results  Component Value Date   WBC 5.1 05/30/2018   HGB  12.8 05/30/2018   HCT 38.5 05/30/2018   MCV 100.5 (H) 05/30/2018   PLT 293 05/30/2018   Recent Labs    05/02/18 0839 05/16/18 0849 05/30/18 0900  NA 137 137 141  K 3.7 3.7 3.7  CL 105 105 106  CO2 26 25 27   GLUCOSE 126* 135* 118*  BUN <5* 6* 5*  CREATININE 0.38* 0.45 0.50  CALCIUM 9.1 9.0 8.9  GFRNONAA >60 >60 >60  GFRAA >60 >60 >60  PROT 7.5 7.4 7.1  ALBUMIN 4.2 4.1 3.9  AST 20 22 18   ALT 16 19 18   ALKPHOS 108 107 104  BILITOT 0.4 0.6 0.5   RADIOGRAPHIC STUDIES: I have personally reviewed the radiological images as listed and agreed with the findings in the report. 12/20/2017 Repeat CT scan showed interval decreased of disease. CT images were Independently reviewed by me and discussed with patient. New 29mm right lung lesion, indeterminate.  02/25/2018 CT chest with contrast, presumed evolutionary changes of radiation therapy in the upper right hemithorax.  Aortic atherosclerosis, emphysema.  ASSESSMENT & PLAN:  Cancer Staging Non-small cell cancer of right lung [facility]) Staging form: Lung, AJCC 8th Edition - Clinical stage from 09/22/2017: Stage Unknown (cTX, cN1, cM0) - Signed by Earlie Server, MD on 09/22/2017 - Pathologic: No stage assigned - Unsigned  1. Encounter for antineoplastic immunotherapy   2. Port-A-Cath in place   3. Thrombosis, portal vein   4. Non-small cell cancer of right lung ([place])   5. Tobacco abuse    #Clinical stage IIIA lung NSCLC, favoring adenocarcinoma Stable disease on most recent CT scan in January 2020.  CT chest with contrast 06/02/2018 independently reviewed by me and discussed with patient.  Stable disease.  Labs reviewed and discussed with patient. Counts acceptable to proceed with today's durvalumab treatment.  No signs of immunotherapy related toxicity so far.  Smoke cessation was discussed again with  patient. She continues to smoke intermittently.  #Catheter related internal jugular/subclavian vein thrombosis. She has been started on anticoagulation with Eliquis 10 mg twice daily for 7 days followed by 5 mg twice daily.  For now Mediport will be continued to facilitate patient's frequent blood draw and treatments.  Patient also prefers not to remove the port. Plan for anticoagulation for 3 months, repeat venous Doppler.  We spent sufficient time to discuss many aspect of care, questions were answered to patient's satisfaction.  RTC 2 weeks with repeat CBC CMP.  Earlie Server, MD, PhD Hematology Oncology [facility] Pager- [phone] 06/13/2018

## 2018-06-24 ENCOUNTER — Other Ambulatory Visit: Payer: Self-pay

## 2018-06-24 DIAGNOSIS — C3491 Malignant neoplasm of unspecified part of right bronchus or lung: Secondary | ICD-10-CM

## 2018-06-27 ENCOUNTER — Inpatient Hospital Stay: Payer: Medicare HMO

## 2018-06-27 ENCOUNTER — Encounter: Payer: Self-pay | Admitting: Oncology

## 2018-06-27 ENCOUNTER — Inpatient Hospital Stay (HOSPITAL_BASED_OUTPATIENT_CLINIC_OR_DEPARTMENT_OTHER): Payer: Medicare HMO | Admitting: Oncology

## 2018-06-27 ENCOUNTER — Other Ambulatory Visit: Payer: Self-pay | Admitting: Oncology

## 2018-06-27 ENCOUNTER — Other Ambulatory Visit: Payer: Self-pay

## 2018-06-27 VITALS — BP 132/81 | HR 99 | Temp 97.2°F | Resp 18 | Wt 190.7 lb

## 2018-06-27 DIAGNOSIS — Z95828 Presence of other vascular implants and grafts: Secondary | ICD-10-CM

## 2018-06-27 DIAGNOSIS — Z5112 Encounter for antineoplastic immunotherapy: Secondary | ICD-10-CM | POA: Diagnosis not present

## 2018-06-27 DIAGNOSIS — Z720 Tobacco use: Secondary | ICD-10-CM | POA: Diagnosis not present

## 2018-06-27 DIAGNOSIS — C3491 Malignant neoplasm of unspecified part of right bronchus or lung: Secondary | ICD-10-CM

## 2018-06-27 DIAGNOSIS — R059 Cough, unspecified: Secondary | ICD-10-CM

## 2018-06-27 DIAGNOSIS — R05 Cough: Secondary | ICD-10-CM

## 2018-06-27 DIAGNOSIS — I8290 Acute embolism and thrombosis of unspecified vein: Secondary | ICD-10-CM

## 2018-06-27 DIAGNOSIS — I82C11 Acute embolism and thrombosis of right internal jugular vein: Secondary | ICD-10-CM

## 2018-06-27 DIAGNOSIS — Z7901 Long term (current) use of anticoagulants: Secondary | ICD-10-CM

## 2018-06-27 DIAGNOSIS — I82B11 Acute embolism and thrombosis of right subclavian vein: Secondary | ICD-10-CM | POA: Diagnosis not present

## 2018-06-27 LAB — COMPREHENSIVE METABOLIC PANEL
ALT: 20 U/L (ref 0–44)
AST: 20 U/L (ref 15–41)
Albumin: 4 g/dL (ref 3.5–5.0)
Alkaline Phosphatase: 104 U/L (ref 38–126)
Anion gap: 8 (ref 5–15)
BUN: 6 mg/dL — ABNORMAL LOW (ref 8–23)
CO2: 25 mmol/L (ref 22–32)
Calcium: 8.9 mg/dL (ref 8.9–10.3)
Chloride: 106 mmol/L (ref 98–111)
Creatinine, Ser: 0.51 mg/dL (ref 0.44–1.00)
GFR calc Af Amer: >60 mL/min (ref >60)
GFR calc non Af Amer: >60 mL/min (ref >60)
Glucose, Bld: 136 mg/dL — ABNORMAL HIGH (ref 70–99)
Potassium: 3.8 mmol/L (ref 3.5–5.1)
Sodium: 139 mmol/L (ref 135–145)
Total Bilirubin: 0.5 mg/dL (ref 0.3–1.2)
Total Protein: 7.2 g/dL (ref 6.5–8.1)

## 2018-06-27 LAB — TSH: TSH: 1.43 u[IU]/mL (ref 0.350–4.500)

## 2018-06-27 LAB — CBC WITH DIFFERENTIAL/PLATELET
Abs Immature Granulocytes: 0.02 10*3/uL (ref 0.00–0.07)
BASOS ABS: 0.1 10*3/uL (ref 0.0–0.1)
Basophils Relative: 1 %
EOS PCT: 9 %
Eosinophils Absolute: 0.5 10*3/uL (ref 0.0–0.5)
HCT: 38 % (ref 36.0–46.0)
Hemoglobin: 12.7 g/dL (ref 12.0–15.0)
Immature Granulocytes: 0 %
Lymphocytes Relative: 21 %
Lymphs Abs: 1.2 10*3/uL (ref 0.7–4.0)
MCH: 33.3 pg (ref 26.0–34.0)
MCHC: 33.4 g/dL (ref 30.0–36.0)
MCV: 99.7 fL (ref 80.0–100.0)
Monocytes Absolute: 0.4 10*3/uL (ref 0.1–1.0)
Monocytes Relative: 7 %
Neutro Abs: 3.5 10*3/uL (ref 1.7–7.7)
Neutrophils Relative %: 62 %
Platelets: 295 10*3/uL (ref 150–400)
RBC: 3.81 MIL/uL — ABNORMAL LOW (ref 3.87–5.11)
RDW: 12.6 % (ref 11.5–15.5)
WBC: 5.6 10*3/uL (ref 4.0–10.5)
nRBC: 0 % (ref 0.0–0.2)

## 2018-06-27 MED ORDER — SODIUM CHLORIDE 0.9% FLUSH
10.0000 mL | Freq: Once | INTRAVENOUS | Status: AC
  Administered 2018-06-27: 10 mL via INTRAVENOUS
  Filled 2018-06-27: qty 10

## 2018-06-27 MED ORDER — SODIUM CHLORIDE 0.9 % IV SOLN
Freq: Once | INTRAVENOUS | Status: AC
  Administered 2018-06-27: 10:00:00 via INTRAVENOUS
  Filled 2018-06-27: qty 250

## 2018-06-27 MED ORDER — HEPARIN SOD (PORK) LOCK FLUSH 100 UNIT/ML IV SOLN
500.0000 [IU] | Freq: Once | INTRAVENOUS | Status: AC
  Administered 2018-06-27: 500 [IU] via INTRAVENOUS
  Filled 2018-06-27: qty 5

## 2018-06-27 MED ORDER — SODIUM CHLORIDE 0.9 % IV SOLN
10.0000 mg/kg | Freq: Once | INTRAVENOUS | Status: AC
  Administered 2018-06-27: 860 mg via INTRAVENOUS
  Filled 2018-06-27: qty 10

## 2018-06-27 NOTE — Progress Notes (Signed)
Patient here for follow up. No concerns voiced.  °

## 2018-06-27 NOTE — Progress Notes (Signed)
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   HISTORY OF PRESENTING ILLNESS:  Connie Powers is a  68 y.o.  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  CT lung cancer screen 05/27/2017   1. A few bilateral pulmonary nodules measuring up to 5 mm are indeterminate. Considering the suspicious 2.0 cm mediastinal adenopathy, follow up with PET/CT is recommended. 2. Mild bronchial wall thickening could represent airway disease including bronchitis.ACR Lung-RADS Category and Recommendation*: ACR Lung-RADS Category 2S (S modifier for mediastinal adenopathy)  PET scan 3/112019 1. No hypermetabolic pulmonary nodules.2. Hypermetabolic enlarged right paratracheal lymph node, of uncertain etiology in isolation. Lymphoproliferative disorder cannot be excluded. 3.  Aortic atherosclerosis (ICD10-170.0).  EBUS biopsy of paratracheal node showed non small cell lung cancer favoring adenocarcinoma. Case was discussed on tumor board on 09/16/2017 and consensus recommendation is to treat as Stage IIIA disease given the invasion of mediastinum.   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 MRI brain is contraindicated. CT brain negative for metastatic disease.  During the interval patient also was referred to vascular surgery for evaluation of malposition of Medi port.  Patient's right jugular port was removed and right internal jugular vein Mediport was placed by Dr.Dew on 8/5 2019.  # CT chest scan done 06/02/2018 which was independent reviewed by me. CT chest showed radiation changes in the medial right upper lobe/paramediastinal region.   No evidence of recurrent or metastatic disease. Nonocclusive pericatheter thrombus/fibrin sheath along patient's right chest port. 06/06/2018 US venous upper right extremity ultrasound showed nonocclusive thrombus in the right jugular vein.  The port catheter can be seen associated with this thrombus.  The jugular vein is partially occlusive.  There is also partially occlusive thrombus in the right subclavian vein.  The right axillary, brachial, radial, and ulnar veins are compressible.  Doppler analysis demonstrated phasicity of the Doppler waveform. Patient was started on Eliquis for anticoagulation.   INTERVAL HISTORY Connie Powers is a 68 y.o. female who has above history reviewed by me today presents for assessment prior to maintenance immunotherapy for stage IIIa non-small cell lung cancer.  #Tolerates immunotherapy well.  Denies any diarrhea, skin rash, shortness of breath, chronic cough is at baseline.  She has no new complaints today.  Continues to smoke intermittently.  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    Current Treatment S/p concurrent Chemotherapy Connie Powers /taxol  Weekly x 6] and RT.  Started on maintenance durvalumab every 2 weeks on 12/27/2017. 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  Review of Systems  Constitutional: Negative for appetite change, chills, fatigue and fever.  HENT:   Negative for hearing loss and voice change.   Eyes: Negative for eye problems.  Respiratory: Positive for cough. Negative for chest tightness.   Cardiovascular: Negative for chest pain.  Gastrointestinal: Negative for abdominal distention, abdominal pain and blood in stool.  Endocrine: Negative for hot flashes.  Genitourinary: Negative for difficulty urinating and frequency.   Musculoskeletal: Negative for arthralgias.  Skin: Negative for itching and rash.  Neurological: Negative for  extremity  weakness.  Hematological: Negative for adenopathy.  Psychiatric/Behavioral: Negative for confusion.    MEDICAL HISTORY:  Past Medical History:  Diagnosis Date  . Brain aneurysm   . Difficult intubation   . Hypertension   . Non-small cell cancer of right lung ([place]) 09/22/2017   Chemo + Rad tx's.   . Viral meningitis     SURGICAL HISTORY: Past Surgical History:  Procedure Laterality Date  . ENDOBRONCHIAL ULTRASOUND N/A 09/09/2017   Procedure: ENDOBRONCHIAL ULTRASOUND;  Surgeon: Laverle Hobby, MD;  Location: [facility] ORS;  Service: Pulmonary;  Laterality: N/A;  . OOPHORECTOMY Left   . PORTA CATH INSERTION N/A 09/29/2017   Procedure: PORTA CATH INSERTION;  Surgeon: Algernon Huxley, MD;  Location: [place] [facility];  Service: Cardiovascular;  Laterality: N/A;  . PORTA CATH INSERTION N/A 01/03/2018   Procedure: PORTA CATH INSERTION;  Surgeon: Algernon Huxley, MD;  Location: [place] [facility];  Service: Cardiovascular;  Laterality: N/A;  . surgical repair for brain tumor  1969   clips in head  . WISDOM TOOTH EXTRACTION      SOCIAL HISTORY: Social History   Socioeconomic History  . Marital status: Married    Spouse name: Not on file  . Number of children: Not on file  . Years of education: Not on file  . Highest education level: Not on file  Occupational History  . Not on file  Social Needs  . Financial resource strain: Not on file  . Food insecurity:    Worry: Not on file    Inability: Not on file  . Transportation needs:    Medical: Not on file    Non-medical: Not on file  Tobacco Use  . Smoking status: Former Smoker    Packs/day: 0.25    Last attempt to quit: 10/03/2017    Years since quitting: 0.7  . Smokeless tobacco: Never Used  Substance and Sexual Activity  . Alcohol use: Not Currently    Comment: social  . Drug use: Not Currently    Types: Cocaine  . Sexual activity: Not on file  Lifestyle  . Physical activity:    Days per week: Not on file     Minutes per session: Not on file  . Stress: Not on file  Relationships  . Social connections:    Talks on phone: Not on file    Gets together: Not on file    Attends religious service: Not on file    Active member of club or organization: Not on file    Attends meetings of clubs or organizations: Not on file    Relationship status: Not on file  . Intimate partner violence:    Fear of current or ex partner: Not on file    Emotionally abused: Not on file    Physically abused: Not on file    Forced sexual activity: Not on file  Other Topics Concern  . Not on file  Social History Narrative  . Not on file    FAMILY HISTORY: Family History  Problem Relation Age of Onset  . Diabetes Mother   . Hypertension Mother   . Hyperlipidemia Mother   . Arthritis Mother   . Hypertension Father   . Hyperlipidemia Father   . Heart attack Father        x2  . Breast cancer Neg Hx     ALLERGIES:  has No Known Allergies.  MEDICATIONS:  Current Outpatient Medications  Medication Sig Dispense Refill  . amLODipine (NORVASC) 10  MG tablet Take 10 mg by mouth daily.  11  . Cyanocobalamin (B-12) 1000 MCG CAPS Take 1,000 mcg by mouth daily. 30 capsule 3  . docusate sodium (COLACE) 100 MG capsule Take 100 mg by mouth daily.    Marland Kitchen ELIQUIS STARTER PACK (ELIQUIS STARTER PACK) 5 MG TABS Take as directed on package: start with two-5mg  tablets twice daily for 7 days. On day 8, switch to one-5mg  tablet twice daily. (Patient taking differently: Take 5 mg by mouth 2 (two) times daily. Take as directed on package: start with two-5mg  tablets twice daily for 7 days. On day 8, switch to one-5mg  tablet twice daily.) 1 each 0  . lidocaine-prilocaine (EMLA) cream Apply to affected area once 30 g 3  . ondansetron (ZOFRAN) 8 MG tablet Take 1 tablet (8 mg total) by mouth 2 (two) times daily as needed for refractory nausea / vomiting. Start on day 3 after chemo. 30 tablet 1  . prochlorperazine (COMPAZINE) 10 MG tablet Take  1 tablet (10 mg total) by mouth every 6 (six) hours as needed (Nausea or vomiting). 30 tablet 1  . umeclidinium-vilanterol (ANORO ELLIPTA) 62.5-25 MCG/INH AEPB Inhale 1 puff into the lungs daily as needed (shortness of breath).      No current facility-administered medications for this visit.      PHYSICAL EXAMINATION: ECOG PERFORMANCE STATUS: 0 - Asymptomatic Vitals:   06/27/18 0920  BP: 132/81  Pulse: 99  Resp: 18  Temp: (!) 97.2 F (36.2 C)  SpO2: 96%   Filed Weights   06/27/18 0920  Weight: 190 lb 11.2 oz (86.5 kg)    Physical Exam Constitutional:      General: She is not in acute distress. HENT:     Head: Normocephalic and atraumatic.  Eyes:     General: No scleral icterus.       Left eye: No discharge.     Conjunctiva/sclera: Conjunctivae normal.     Pupils: Pupils are equal, round, and reactive to light.  Neck:     Musculoskeletal: Normal range of motion and neck supple.     Comments: Right supra clavicular area mild swelling. Cardiovascular:     Rate and Rhythm: Normal rate and regular rhythm.     Heart sounds: Normal heart sounds. No murmur.  Pulmonary:     Effort: Pulmonary effort is normal. No respiratory distress.     Breath sounds: Normal breath sounds. No wheezing or rales.  Chest:     Chest wall: No tenderness.  Abdominal:     General: Bowel sounds are normal. There is no distension.     Palpations: Abdomen is soft. There is no mass.     Tenderness: There is no abdominal tenderness. There is no guarding.  Musculoskeletal: Normal range of motion.        General: No deformity.  Lymphadenopathy:     Cervical: No cervical adenopathy.  Skin:    General: Skin is warm and dry.     Findings: No erythema or rash.     Comments: Right anterior chest wall + Mediport  Neurological:     Mental Status: She is alert and oriented to person, place, and time.     Cranial Nerves: No cranial nerve deficit.     Coordination: Coordination normal.  Psychiatric:          Behavior: Behavior normal.        Thought Content: Thought content normal.      LABORATORY DATA:  I have reviewed the data  as listed Lab Results  Component Value Date   WBC 5.6 06/27/2018   HGB 12.7 06/27/2018   HCT 38.0 06/27/2018   MCV 99.7 06/27/2018   PLT 295 06/27/2018   Recent Labs    05/30/18 0900 06/13/18 0907 06/27/18 0908  NA 141 139 139  K 3.7 3.6 3.8  CL 106 105 106  CO2 27 27 25   GLUCOSE 118* 154* 136*  BUN 5* 7* 6*  CREATININE 0.50 0.52 0.51  CALCIUM 8.9 9.3 8.9  GFRNONAA >60 >60 >60  GFRAA >60 >60 >60  PROT 7.1 7.6 7.2  ALBUMIN 3.9 4.2 4.0  AST 18 18 20   ALT 18 23 20   ALKPHOS 104 110 104  BILITOT 0.5 0.7 0.5   RADIOGRAPHIC STUDIES: I have personally reviewed the radiological images as listed and agreed with the findings in the report. 12/20/2017 Repeat CT scan showed interval decreased of disease. CT images were Independently reviewed by me and discussed with patient. New 96mm right lung lesion, indeterminate.  02/25/2018 CT chest with contrast, presumed evolutionary changes of radiation therapy in the upper right hemithorax.  Aortic atherosclerosis, emphysema.  ASSESSMENT & PLAN:  Cancer Staging Non-small cell cancer of right lung [facility]) Staging form: Lung, AJCC 8th Edition - Clinical stage from 09/22/2017: Stage Unknown (cTX, cN1, cM0) - Signed by Earlie Server, MD on 09/22/2017 - Pathologic: No stage assigned - Unsigned  1. Non-small cell cancer of right lung ([place])   2. Port-A-Cath in place   3. Encounter for antineoplastic immunotherapy   4. Thrombosis    #Clinical stage IIIA lung NSCLC, favoring adenocarcinoma Stable disease on most recent CT scan in January 2020.  Tolerating immunotherapy well. Labs are reviewed and discussed with patient.  Proceed with durvalumab treatment today. Plan total of 26 treatment. Repeat CT scanning in 3 months.  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   Tolerating well. Plan Eliquis 5 mg twice daily to complete 3 to 6 months of anticoagulation. May reduce to 2.5 mg twice daily for maintenance. Repeat image in 3 months.  RTC 2 weeks with repeat CBC CMP. We spent sufficient time to discuss many aspect of care, questions were answered to patient's satisfaction. Total face to face encounter time for this patient visit was 25 min. >50% of the time was  spent in counseling and coordination of care.    Earlie Server, MD, PhD Hematology Oncology [facility] Pager- [phone] 06/27/2018

## 2018-07-07 ENCOUNTER — Other Ambulatory Visit: Payer: Self-pay | Admitting: Oncology

## 2018-07-07 MED ORDER — APIXABAN 5 MG PO TABS: 5.0000 mg | ORAL_TABLET | Freq: Two times a day (BID) | ORAL | 0 refills | Status: DC

## 2018-07-11 ENCOUNTER — Inpatient Hospital Stay: Payer: Medicare HMO | Attending: Oncology

## 2018-07-11 ENCOUNTER — Inpatient Hospital Stay (HOSPITAL_BASED_OUTPATIENT_CLINIC_OR_DEPARTMENT_OTHER): Payer: Medicare HMO | Admitting: Oncology

## 2018-07-11 ENCOUNTER — Inpatient Hospital Stay: Payer: Medicare HMO

## 2018-07-11 ENCOUNTER — Encounter: Payer: Self-pay | Admitting: Oncology

## 2018-07-11 ENCOUNTER — Other Ambulatory Visit: Payer: Self-pay

## 2018-07-11 VITALS — BP 139/81 | HR 79 | Temp 97.0°F | Wt 189.1 lb

## 2018-07-11 DIAGNOSIS — I8290 Acute embolism and thrombosis of unspecified vein: Secondary | ICD-10-CM

## 2018-07-11 DIAGNOSIS — I82C11 Acute embolism and thrombosis of right internal jugular vein: Secondary | ICD-10-CM

## 2018-07-11 DIAGNOSIS — Z720 Tobacco use: Secondary | ICD-10-CM

## 2018-07-11 DIAGNOSIS — R05 Cough: Secondary | ICD-10-CM | POA: Diagnosis not present

## 2018-07-11 DIAGNOSIS — C3491 Malignant neoplasm of unspecified part of right bronchus or lung: Secondary | ICD-10-CM

## 2018-07-11 DIAGNOSIS — Z5112 Encounter for antineoplastic immunotherapy: Secondary | ICD-10-CM

## 2018-07-11 DIAGNOSIS — Z7901 Long term (current) use of anticoagulants: Secondary | ICD-10-CM | POA: Diagnosis not present

## 2018-07-11 DIAGNOSIS — F17200 Nicotine dependence, unspecified, uncomplicated: Secondary | ICD-10-CM | POA: Insufficient documentation

## 2018-07-11 DIAGNOSIS — I82B11 Acute embolism and thrombosis of right subclavian vein: Secondary | ICD-10-CM | POA: Diagnosis not present

## 2018-07-11 DIAGNOSIS — Z79899 Other long term (current) drug therapy: Secondary | ICD-10-CM | POA: Insufficient documentation

## 2018-07-11 DIAGNOSIS — Z95828 Presence of other vascular implants and grafts: Secondary | ICD-10-CM

## 2018-07-11 LAB — CBC WITH DIFFERENTIAL/PLATELET
Abs Immature Granulocytes: 0.02 10*3/uL (ref 0.00–0.07)
Basophils Absolute: 0 10*3/uL (ref 0.0–0.1)
Basophils Relative: 1 %
EOS PCT: 8 %
Eosinophils Absolute: 0.4 10*3/uL (ref 0.0–0.5)
HCT: 37.6 % (ref 36.0–46.0)
Hemoglobin: 12.6 g/dL (ref 12.0–15.0)
Immature Granulocytes: 0 %
Lymphocytes Relative: 22 %
Lymphs Abs: 1.1 10*3/uL (ref 0.7–4.0)
MCH: 33.2 pg (ref 26.0–34.0)
MCHC: 33.5 g/dL (ref 30.0–36.0)
MCV: 99.2 fL (ref 80.0–100.0)
MONO ABS: 0.3 10*3/uL (ref 0.1–1.0)
Monocytes Relative: 6 %
Neutro Abs: 3.2 10*3/uL (ref 1.7–7.7)
Neutrophils Relative %: 63 %
Platelets: 296 10*3/uL (ref 150–400)
RBC: 3.79 MIL/uL — ABNORMAL LOW (ref 3.87–5.11)
RDW: 12.6 % (ref 11.5–15.5)
WBC: 5.1 10*3/uL (ref 4.0–10.5)
nRBC: 0 % (ref 0.0–0.2)

## 2018-07-11 LAB — COMPREHENSIVE METABOLIC PANEL
ALK PHOS: 103 U/L (ref 38–126)
ALT: 20 U/L (ref 0–44)
AST: 17 U/L (ref 15–41)
Albumin: 4 g/dL (ref 3.5–5.0)
Anion gap: 6 (ref 5–15)
BUN: 7 mg/dL — AB (ref 8–23)
CO2: 25 mmol/L (ref 22–32)
Calcium: 8.7 mg/dL — ABNORMAL LOW (ref 8.9–10.3)
Chloride: 106 mmol/L (ref 98–111)
Creatinine, Ser: 0.44 mg/dL (ref 0.44–1.00)
GFR calc Af Amer: >60 mL/min (ref >60)
GFR calc non Af Amer: >60 mL/min (ref >60)
Glucose, Bld: 116 mg/dL — ABNORMAL HIGH (ref 70–99)
Potassium: 3.5 mmol/L (ref 3.5–5.1)
Sodium: 137 mmol/L (ref 135–145)
Total Bilirubin: 0.5 mg/dL (ref 0.3–1.2)
Total Protein: 7.2 g/dL (ref 6.5–8.1)

## 2018-07-11 MED ORDER — SODIUM CHLORIDE 0.9 % IV SOLN
Freq: Once | INTRAVENOUS | Status: AC
  Administered 2018-07-11: 11:00:00 via INTRAVENOUS
  Filled 2018-07-11: qty 250

## 2018-07-11 MED ORDER — SODIUM CHLORIDE 0.9% FLUSH
10.0000 mL | INTRAVENOUS | Status: DC | PRN
  Administered 2018-07-11: 10 mL via INTRAVENOUS
  Filled 2018-07-11: qty 10

## 2018-07-11 MED ORDER — SODIUM CHLORIDE 0.9 % IV SOLN
10.0000 mg/kg | Freq: Once | INTRAVENOUS | Status: AC
  Administered 2018-07-11: 860 mg via INTRAVENOUS
  Filled 2018-07-11: qty 10

## 2018-07-11 MED ORDER — HEPARIN SOD (PORK) LOCK FLUSH 100 UNIT/ML IV SOLN
500.0000 [IU] | Freq: Once | INTRAVENOUS | Status: AC
  Administered 2018-07-11: 500 [IU] via INTRAVENOUS
  Filled 2018-07-11: qty 5

## 2018-07-11 NOTE — Progress Notes (Signed)
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   HISTORY OF PRESENTING ILLNESS:  Connie Powers is a  68 y.o.  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  CT lung cancer screen 05/27/2017   1. A few bilateral pulmonary nodules measuring up to 5 mm are indeterminate. Considering the suspicious 2.0 cm mediastinal adenopathy, follow up with PET/CT is recommended. 2. Mild bronchial wall thickening could represent airway disease including bronchitis.ACR Lung-RADS Category and Recommendation*: ACR Lung-RADS Category 2S (S modifier for mediastinal adenopathy)  PET scan 3/112019 1. No hypermetabolic pulmonary nodules.2. Hypermetabolic enlarged right paratracheal lymph node, of uncertain etiology in isolation. Lymphoproliferative disorder cannot be excluded. 3.  Aortic atherosclerosis (ICD10-170.0).  EBUS biopsy of paratracheal node showed non small cell lung cancer favoring adenocarcinoma. Case was discussed on tumor board on 09/16/2017 and consensus recommendation is to treat as Stage IIIA disease given the invasion of mediastinum.   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 MRI brain is contraindicated. CT brain negative for metastatic disease.  During the interval patient also was referred to vascular surgery for evaluation of malposition of Medi port.  Patient's right jugular port was removed and right internal jugular vein Mediport was placed by Dr.Dew on 8/5 2019.  # CT chest scan done 06/02/2018 which was independent reviewed by me. CT chest showed radiation changes in the medial right upper lobe/paramediastinal region.   No evidence of recurrent or metastatic disease. Nonocclusive pericatheter thrombus/fibrin sheath along patient's right chest port. 06/06/2018 US venous upper right extremity ultrasound showed nonocclusive thrombus in the right jugular vein.  The port catheter can be seen associated with this thrombus.  The jugular vein is partially occlusive.  There is also partially occlusive thrombus in the right subclavian vein.  The right axillary, brachial, radial, and ulnar veins are compressible.  Doppler analysis demonstrated phasicity of the Doppler waveform. Patient was started on Eliquis for anticoagulation.   INTERVAL HISTORY Connie Powers is a 68 y.o. female who has above history reviewed by me today presents for assessment prior to maintenance immunotherapy for stage IIIa non-small cell lung cancer. Tolerates immunotherapy well.  Denies any diarrhea, skin rash, shortness of breath Chronic cough is at baseline. She has no new complaints today. She continues to smoke intermittently. 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 Tolerates well.  Denies any bleeding events.   Current Treatment S/p concurrent Chemotherapy [name] /taxol  Weekly x 6] and RT.  Started on maintenance durvalumab every 2 weeks on 12/27/2017. 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  Review of Systems  Constitutional: Negative for appetite change, chills, fatigue and fever.  HENT:   Negative for hearing loss and voice change.   Eyes: Negative for eye problems.  Respiratory: Positive for cough. Negative for chest tightness.   Cardiovascular: Negative for chest pain.  Gastrointestinal: Negative for abdominal distention, abdominal pain and blood in stool.  Endocrine: Negative for hot flashes.  Genitourinary: Negative for difficulty urinating and frequency.   Musculoskeletal: Negative for arthralgias.  Skin: Negative for itching  and rash.  Neurological: Negative for extremity weakness.  Hematological: Negative for adenopathy.  Psychiatric/Behavioral: Negative for confusion.    MEDICAL HISTORY:  Past Medical History:  Diagnosis Date  . Brain aneurysm   . Difficult intubation   . Hypertension   . Non-small cell cancer of right lung ([place]) 09/22/2017   Chemo + Rad tx's.   . Viral meningitis     SURGICAL HISTORY: Past Surgical History:  Procedure Laterality Date  . ENDOBRONCHIAL ULTRASOUND N/A 09/09/2017   Procedure: ENDOBRONCHIAL ULTRASOUND;  Surgeon: Laverle Hobby, MD;  Location: [facility];  Service: Pulmonary;  Laterality: N/A;  . OOPHORECTOMY Left   . PORTA CATH INSERTION N/A 09/29/2017   Procedure: PORTA CATH INSERTION;  Surgeon: Algernon Huxley, MD;  Location: [place] [facility];  Service: Cardiovascular;  Laterality: N/A;  . PORTA CATH INSERTION N/A 01/03/2018   Procedure: PORTA CATH INSERTION;  Surgeon: Algernon Huxley, MD;  Location: [place] [facility];  Service: Cardiovascular;  Laterality: N/A;  . surgical repair for brain tumor  1969   clips in head  . WISDOM TOOTH EXTRACTION      SOCIAL HISTORY: Social History   Socioeconomic History  . Marital status: Married    Spouse name: Not on file  . Number of children: Not on file  . Years of education: Not on file  . Highest education level: Not on file  Occupational History  . Not on file  Social Needs  . Financial resource strain: Not on file  . Food insecurity:    Worry: Not on file    Inability: Not on file  . Transportation needs:    Medical: Not on file    Non-medical: Not on file  Tobacco Use  . Smoking status: Former Smoker    Packs/day: 0.25    Last attempt to quit: 10/03/2017    Years since quitting: 0.7  . Smokeless tobacco: Never Used  Substance and Sexual Activity  . Alcohol use: Not Currently    Comment: social  . Drug use: Not Currently    Types: Cocaine  . Sexual activity: Not on file  Lifestyle  . Physical  activity:    Days per week: Not on file    Minutes per session: Not on file  . Stress: Not on file  Relationships  . Social connections:    Talks on phone: Not on file    Gets together: Not on file    Attends religious service: Not on file    Active member of club or organization: Not on file    Attends meetings of clubs or organizations: Not on file    Relationship status: Not on file  . Intimate partner violence:    Fear of current or ex partner: Not on file    Emotionally abused: Not on file    Physically abused: Not on file    Forced sexual activity: Not on file  Other Topics Concern  . Not on file  Social History Narrative  . Not on file    FAMILY HISTORY: Family History  Problem Relation Age of Onset  . Diabetes Mother   . Hypertension Mother   . Hyperlipidemia Mother   . Arthritis Mother   . Hypertension Father   . Hyperlipidemia Father   . Heart attack Father        x2  . Breast cancer Neg Hx     ALLERGIES:  has No Known Allergies.  MEDICATIONS:  Current Outpatient Medications  Medication Sig Dispense Refill  .  amLODipine (NORVASC) 10 MG tablet Take 10 mg by mouth daily.  11  . apixaban (ELIQUIS) 5 MG TABS tablet Take 1 tablet (5 mg total) by mouth 2 (two) times daily. 60 tablet 0  . Cyanocobalamin (B-12) 1000 MCG CAPS Take 1,000 mcg by mouth daily. 30 capsule 3  . docusate sodium (COLACE) 100 MG capsule Take 100 mg by mouth daily.    Marland Kitchen lidocaine-prilocaine (EMLA) cream Apply to affected area once 30 g 3  . ondansetron (ZOFRAN) 8 MG tablet Take 1 tablet (8 mg total) by mouth 2 (two) times daily as needed for refractory nausea / vomiting. Start on day 3 after chemo. 30 tablet 1  . prochlorperazine (COMPAZINE) 10 MG tablet Take 1 tablet (10 mg total) by mouth every 6 (six) hours as needed (Nausea or vomiting). 30 tablet 1  . umeclidinium-vilanterol (ANORO ELLIPTA) 62.5-25 MCG/INH AEPB Inhale 1 puff into the lungs daily as needed (shortness of breath).      No  current facility-administered medications for this visit.      PHYSICAL EXAMINATION: ECOG PERFORMANCE STATUS: 0 - Asymptomatic Vitals:   07/11/18 0951  BP: 139/81  Pulse: 79  Temp: (!) 97 F (36.1 C)   Filed Weights   07/11/18 0951  Weight: 189 lb 1.6 oz (85.8 kg)    Physical Exam Constitutional:      General: She is not in acute distress. HENT:     Head: Normocephalic and atraumatic.  Eyes:     General: No scleral icterus.       Left eye: No discharge.     Conjunctiva/sclera: Conjunctivae normal.     Pupils: Pupils are equal, round, and reactive to light.  Neck:     Musculoskeletal: Normal range of motion and neck supple.  Cardiovascular:     Rate and Rhythm: Normal rate and regular rhythm.     Heart sounds: Normal heart sounds. No murmur.  Pulmonary:     Effort: Pulmonary effort is normal. No respiratory distress.     Breath sounds: Normal breath sounds. No wheezing or rales.  Chest:     Chest wall: No tenderness.  Abdominal:     General: Bowel sounds are normal. There is no distension.     Palpations: Abdomen is soft. There is no mass.     Tenderness: There is no abdominal tenderness. There is no guarding.  Musculoskeletal: Normal range of motion.        General: No deformity.  Lymphadenopathy:     Cervical: No cervical adenopathy.  Skin:    General: Skin is warm and dry.     Findings: No erythema or rash.     Comments: Right anterior chest wall + Mediport  Neurological:     Mental Status: She is alert and oriented to person, place, and time.     Cranial Nerves: No cranial nerve deficit.     Coordination: Coordination normal.  Psychiatric:        Behavior: Behavior normal.        Thought Content: Thought content normal.      LABORATORY DATA:  I have reviewed the data as listed Lab Results  Component Value Date   WBC 5.1 07/11/2018   HGB 12.6 07/11/2018   HCT 37.6 07/11/2018   MCV 99.2 07/11/2018   PLT 296 07/11/2018   Recent Labs     06/13/18 0907 06/27/18 0908 07/11/18 0900  NA 139 139 137  K 3.6 3.8 3.5  CL 105 106 106  CO2  27 25 25   GLUCOSE 154* 136* 116*  BUN 7* 6* 7*  CREATININE 0.52 0.51 0.44  CALCIUM 9.3 8.9 8.7*  GFRNONAA >60 >60 >60  GFRAA >60 >60 >60  PROT 7.6 7.2 7.2  ALBUMIN 4.2 4.0 4.0  AST 18 20 17   ALT 23 20 20   ALKPHOS 110 104 103  BILITOT 0.7 0.5 0.5   RADIOGRAPHIC STUDIES: I have personally reviewed the radiological images as listed and agreed with the findings in the report. 12/20/2017 Repeat CT scan showed interval decreased of disease. CT images were Independently reviewed by me and discussed with patient. New 30mm right lung lesion, indeterminate.  02/25/2018 CT chest with contrast, presumed evolutionary changes of radiation therapy in the upper right hemithorax.  Aortic atherosclerosis, emphysema.  ASSESSMENT & PLAN:  Cancer Staging Non-small cell lung cancer ([place]) Staging form: Lung, AJCC 8th Edition - Clinical stage from 09/22/2017: Stage Unknown (cTX, cN1, cM0) - Signed by Earlie Server, MD on 09/22/2017 - Pathologic: No stage assigned - Unsigned  1. Non-small cell cancer of right lung ([place])   2. Encounter for antineoplastic immunotherapy   3. Port-A-Cath in place   4. Thrombosis    #Clinical stage IIIA lung NSCLC, favoring adenocarcinoma Stable disease on most recent CT scan in January 2020.  Tolerating immunotherapy well.  No clinical signs of side effects .  Labs reviewed and discussed with patient. Proceed with durvalumab treatment today. Plan total of 26 treatment. Repeat CT scan in 3 months . 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 Continue Eliquis 5 mg twice daily. Plan remove Mediport after she finished 26 treatment of durvalumab. After port is removed, anticoagulation can also discontinued at that time.  RTC 2 weeks with repeat CBC and CMP. We spent sufficient time to discuss many aspect of care, questions were answered to patient's  satisfaction. Total face to face encounter time for this patient visit was 25 min. >50% of the time was  spent in counseling and coordination of care.   Earlie Server, MD, PhD Hematology Oncology [facility] at [facility] Pager- [phone] 07/11/2018

## 2018-07-25 ENCOUNTER — Inpatient Hospital Stay: Payer: Medicare HMO

## 2018-07-25 ENCOUNTER — Encounter: Payer: Self-pay | Admitting: Oncology

## 2018-07-25 ENCOUNTER — Other Ambulatory Visit: Payer: Self-pay

## 2018-07-25 ENCOUNTER — Inpatient Hospital Stay (HOSPITAL_BASED_OUTPATIENT_CLINIC_OR_DEPARTMENT_OTHER): Payer: Medicare HMO | Admitting: Oncology

## 2018-07-25 VITALS — BP 136/83 | HR 98 | Temp 96.7°F | Resp 18 | Wt 189.5 lb

## 2018-07-25 DIAGNOSIS — Z5112 Encounter for antineoplastic immunotherapy: Secondary | ICD-10-CM | POA: Diagnosis not present

## 2018-07-25 DIAGNOSIS — C3491 Malignant neoplasm of unspecified part of right bronchus or lung: Secondary | ICD-10-CM

## 2018-07-25 DIAGNOSIS — I82B11 Acute embolism and thrombosis of right subclavian vein: Secondary | ICD-10-CM | POA: Diagnosis not present

## 2018-07-25 DIAGNOSIS — R059 Cough, unspecified: Secondary | ICD-10-CM

## 2018-07-25 DIAGNOSIS — R05 Cough: Secondary | ICD-10-CM

## 2018-07-25 DIAGNOSIS — I82C11 Acute embolism and thrombosis of right internal jugular vein: Secondary | ICD-10-CM | POA: Diagnosis not present

## 2018-07-25 DIAGNOSIS — Z87891 Personal history of nicotine dependence: Secondary | ICD-10-CM

## 2018-07-25 DIAGNOSIS — Z95828 Presence of other vascular implants and grafts: Secondary | ICD-10-CM

## 2018-07-25 DIAGNOSIS — Z7901 Long term (current) use of anticoagulants: Secondary | ICD-10-CM

## 2018-07-25 DIAGNOSIS — I8290 Acute embolism and thrombosis of unspecified vein: Secondary | ICD-10-CM

## 2018-07-25 LAB — CBC WITH DIFFERENTIAL/PLATELET
Abs Immature Granulocytes: 0.01 10*3/uL (ref 0.00–0.07)
Basophils Absolute: 0 10*3/uL (ref 0.0–0.1)
Basophils Relative: 1 %
Eosinophils Absolute: 0.6 10*3/uL — ABNORMAL HIGH (ref 0.0–0.5)
Eosinophils Relative: 10 %
HEMATOCRIT: 35.3 % — AB (ref 36.0–46.0)
Hemoglobin: 12 g/dL (ref 12.0–15.0)
Immature Granulocytes: 0 %
Lymphocytes Relative: 22 %
Lymphs Abs: 1.2 10*3/uL (ref 0.7–4.0)
MCH: 34 pg (ref 26.0–34.0)
MCHC: 34 g/dL (ref 30.0–36.0)
MCV: 100 fL (ref 80.0–100.0)
Monocytes Absolute: 0.4 10*3/uL (ref 0.1–1.0)
Monocytes Relative: 8 %
NEUTROS PCT: 59 %
Neutro Abs: 3.2 10*3/uL (ref 1.7–7.7)
PLATELETS: 289 10*3/uL (ref 150–400)
RBC: 3.53 MIL/uL — ABNORMAL LOW (ref 3.87–5.11)
RDW: 12.5 % (ref 11.5–15.5)
WBC: 5.3 10*3/uL (ref 4.0–10.5)
nRBC: 0 % (ref 0.0–0.2)

## 2018-07-25 LAB — COMPREHENSIVE METABOLIC PANEL
ALK PHOS: 104 U/L (ref 38–126)
ALT: 21 U/L (ref 0–44)
AST: 20 U/L (ref 15–41)
Albumin: 4 g/dL (ref 3.5–5.0)
Anion gap: 7 (ref 5–15)
BUN: 6 mg/dL — ABNORMAL LOW (ref 8–23)
CO2: 25 mmol/L (ref 22–32)
Calcium: 8.9 mg/dL (ref 8.9–10.3)
Chloride: 105 mmol/L (ref 98–111)
Creatinine, Ser: 0.54 mg/dL (ref 0.44–1.00)
GFR calc Af Amer: >60 mL/min (ref >60)
GFR calc non Af Amer: >60 mL/min (ref >60)
Glucose, Bld: 134 mg/dL — ABNORMAL HIGH (ref 70–99)
Potassium: 3.4 mmol/L — ABNORMAL LOW (ref 3.5–5.1)
Sodium: 137 mmol/L (ref 135–145)
Total Bilirubin: 0.6 mg/dL (ref 0.3–1.2)
Total Protein: 7.1 g/dL (ref 6.5–8.1)

## 2018-07-25 LAB — TSH: TSH: 1.412 u[IU]/mL (ref 0.350–4.500)

## 2018-07-25 MED ORDER — SODIUM CHLORIDE 0.9% FLUSH
10.0000 mL | Freq: Once | INTRAVENOUS | Status: AC
  Administered 2018-07-25: 10 mL via INTRAVENOUS
  Filled 2018-07-25: qty 10

## 2018-07-25 MED ORDER — POTASSIUM CHLORIDE CRYS ER 20 MEQ PO TBCR: 20.0000 meq | EXTENDED_RELEASE_TABLET | Freq: Every day | ORAL | 0 refills | Status: DC

## 2018-07-25 MED ORDER — SODIUM CHLORIDE 0.9 % IV SOLN
Freq: Once | INTRAVENOUS | Status: AC
  Administered 2018-07-25: 11:00:00 via INTRAVENOUS
  Filled 2018-07-25: qty 250

## 2018-07-25 MED ORDER — HEPARIN SOD (PORK) LOCK FLUSH 100 UNIT/ML IV SOLN
500.0000 [IU] | Freq: Once | INTRAVENOUS | Status: AC
  Administered 2018-07-25: 500 [IU] via INTRAVENOUS

## 2018-07-25 MED ORDER — SODIUM CHLORIDE 0.9 % IV SOLN
10.0000 mg/kg | Freq: Once | INTRAVENOUS | Status: AC
  Administered 2018-07-25: 860 mg via INTRAVENOUS
  Filled 2018-07-25: qty 10

## 2018-07-25 NOTE — Progress Notes (Signed)
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   HISTORY OF PRESENTING ILLNESS:  Connie Powers is a  68 y.o.  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  CT lung cancer screen 05/27/2017   1. A few bilateral pulmonary nodules measuring up to 5 mm are indeterminate. Considering the suspicious 2.0 cm mediastinal adenopathy, follow up with PET/CT is recommended. 2. Mild bronchial wall thickening could represent airway disease including bronchitis.ACR Lung-RADS Category and Recommendation*: ACR Lung-RADS Category 2S (S modifier for mediastinal adenopathy)  PET scan 3/112019 1. No hypermetabolic pulmonary nodules.2. Hypermetabolic enlarged right paratracheal lymph node, of uncertain etiology in isolation. Lymphoproliferative disorder cannot be excluded. 3.  Aortic atherosclerosis (ICD10-170.0).  EBUS biopsy of paratracheal node showed non small cell lung cancer favoring adenocarcinoma. Case was discussed on tumor board on 09/16/2017 and consensus recommendation is to treat as Stage IIIA disease given the invasion of mediastinum.   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 MRI brain is contraindicated. CT brain negative for metastatic disease.  During the interval patient also was referred to vascular surgery for evaluation of malposition of Medi port.  Patient's right jugular port was removed and right internal jugular vein Mediport was placed by Dr.Dew on 8/5 2019.  # CT chest scan done 06/02/2018 which was independent reviewed by me. CT chest showed radiation changes in the medial right upper lobe/paramediastinal region.   No evidence of recurrent or metastatic disease. Nonocclusive pericatheter thrombus/fibrin sheath along patient's right chest port. 06/06/2018 US venous upper right extremity ultrasound showed nonocclusive thrombus in the right jugular vein.  The port catheter can be seen associated with this thrombus.  The jugular vein is partially occlusive.  There is also partially occlusive thrombus in the right subclavian vein.  The right axillary, brachial, radial, and ulnar veins are compressible.  Doppler analysis demonstrated phasicity of the Doppler waveform. Patient was started on Eliquis for anticoagulation.   INTERVAL HISTORY Connie Powers is a 68 y.o. female who has above history reviewed by me today presents for assessment prior to maintenance immunotherapy for stage IIIa non-small cell lung cancer. Patient was accompanied by sister clinic today.  Reports feeling well. Tolerate immunotherapy very well.  Denies any diarrhea skin rash, shortness of breath. Chronic cough with whitish sputum at baseline. No complaints today. Reports that she finally quit smoking. 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 Reports tolerating well.  Denies any bleeding events.  She is not interested in having the Mediport removed and receive immunotherapy via peripheral vein.   Current Treatment S/p concurrent Chemotherapy Connie Powers /taxol  Weekly x 6] and RT.  Started on maintenance durvalumab every 2 weeks on 12/27/2017. 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  Review of Systems  Constitutional: Negative for appetite change, chills, fatigue and fever.  HENT:   Negative for hearing loss and voice change.   Eyes: Negative for eye problems.  Respiratory: Positive for cough. Negative for chest tightness.   Cardiovascular: Negative for chest pain.  Gastrointestinal: Negative for abdominal distention, abdominal pain and  blood in  stool.  Endocrine: Negative for hot flashes.  Genitourinary: Negative for difficulty urinating and frequency.   Musculoskeletal: Negative for arthralgias.  Skin: Negative for itching and rash.  Neurological: Negative for extremity weakness.  Hematological: Negative for adenopathy.  Psychiatric/Behavioral: Negative for confusion.    MEDICAL HISTORY:  Past Medical History:  Diagnosis Date  . Brain aneurysm   . Difficult intubation   . Hypertension   . Non-small cell cancer of right lung ([place]) 09/22/2017   Chemo + Rad tx's.   . Viral meningitis     SURGICAL HISTORY: Past Surgical History:  Procedure Laterality Date  . ENDOBRONCHIAL ULTRASOUND N/A 09/09/2017   Procedure: ENDOBRONCHIAL ULTRASOUND;  Surgeon: Laverle Hobby, MD;  Location: [facility] ORS;  Service: Pulmonary;  Laterality: N/A;  . OOPHORECTOMY Left   . PORTA CATH INSERTION N/A 09/29/2017   Procedure: PORTA CATH INSERTION;  Surgeon: Algernon Huxley, MD;  Location: [place] [facility];  Service: Cardiovascular;  Laterality: N/A;  . PORTA CATH INSERTION N/A 01/03/2018   Procedure: PORTA CATH INSERTION;  Surgeon: Algernon Huxley, MD;  Location: [place] [facility];  Service: Cardiovascular;  Laterality: N/A;  . surgical repair for brain tumor  1969   clips in head  . WISDOM TOOTH EXTRACTION      SOCIAL HISTORY: Social History   Socioeconomic History  . Marital status: Married    Spouse name: Not on file  . Number of children: Not on file  . Years of education: Not on file  . Highest education level: Not on file  Occupational History  . Not on file  Social Needs  . Financial resource strain: Not on file  . Food insecurity:    Worry: Not on file    Inability: Not on file  . Transportation needs:    Medical: Not on file    Non-medical: Not on file  Tobacco Use  . Smoking status: Former Smoker    Packs/day: 0.25    Last attempt to quit: 10/03/2017    Years since quitting: 0.8  . Smokeless tobacco: Never Used   Substance and Sexual Activity  . Alcohol use: Not Currently    Comment: social  . Drug use: Not Currently    Types: Cocaine  . Sexual activity: Not on file  Lifestyle  . Physical activity:    Days per week: Not on file    Minutes per session: Not on file  . Stress: Not on file  Relationships  . Social connections:    Talks on phone: Not on file    Gets together: Not on file    Attends religious service: Not on file    Active member of club or organization: Not on file    Attends meetings of clubs or organizations: Not on file    Relationship status: Not on file  . Intimate partner violence:    Fear of current or ex partner: Not on file    Emotionally abused: Not on file    Physically abused: Not on file    Forced sexual activity: Not on file  Other Topics Concern  . Not on file  Social History Narrative  . Not on file    FAMILY HISTORY: Family History  Problem Relation Age of Onset  . Diabetes Mother   . Hypertension Mother   . Hyperlipidemia Mother   . Arthritis Mother   . Hypertension Father   . Hyperlipidemia Father   . Heart attack Father  x2  . Breast cancer Neg Hx     ALLERGIES:  has No Known Allergies.  MEDICATIONS:  Current Outpatient Medications  Medication Sig Dispense Refill  . amLODipine (NORVASC) 10 MG tablet Take 10 mg by mouth daily.  11  . apixaban (ELIQUIS) 5 MG TABS tablet Take 1 tablet (5 mg total) by mouth 2 (two) times daily. 60 tablet 0  . Cyanocobalamin (B-12) 1000 MCG CAPS Take 1,000 mcg by mouth daily. 30 capsule 3  . docusate sodium (COLACE) 100 MG capsule Take 100 mg by mouth daily.    Marland Kitchen lidocaine-prilocaine (EMLA) cream Apply to affected area once 30 g 3  . ondansetron (ZOFRAN) 8 MG tablet Take 1 tablet (8 mg total) by mouth 2 (two) times daily as needed for refractory nausea / vomiting. Start on day 3 after chemo. 30 tablet 1  . prochlorperazine (COMPAZINE) 10 MG tablet Take 1 tablet (10 mg total) by mouth every 6 (six) hours  as needed (Nausea or vomiting). 30 tablet 1  . umeclidinium-vilanterol (ANORO ELLIPTA) 62.5-25 MCG/INH AEPB Inhale 1 puff into the lungs daily as needed (shortness of breath).     . potassium chloride SA (K-DUR,KLOR-CON) 20 MEQ tablet Take 1 tablet (20 mEq total) by mouth daily. 3 tablet 0   No current facility-administered medications for this visit.      PHYSICAL EXAMINATION: ECOG PERFORMANCE STATUS: 0 - Asymptomatic Vitals:   07/25/18 1019  BP: 136/83  Pulse: 98  Resp: 18  Temp: (!) 96.7 F (35.9 C)  SpO2: 100%   Filed Weights   07/25/18 1019  Weight: 189 lb 8 oz (86 kg)    Physical Exam Constitutional:      General: She is not in acute distress. HENT:     Head: Normocephalic and atraumatic.  Eyes:     General: No scleral icterus.       Left eye: No discharge.     Conjunctiva/sclera: Conjunctivae normal.     Pupils: Pupils are equal, round, and reactive to light.  Neck:     Musculoskeletal: Normal range of motion and neck supple.  Cardiovascular:     Rate and Rhythm: Normal rate and regular rhythm.     Heart sounds: Normal heart sounds. No murmur.  Pulmonary:     Effort: Pulmonary effort is normal. No respiratory distress.     Breath sounds: Normal breath sounds. No wheezing or rales.  Chest:     Chest wall: No tenderness.  Abdominal:     General: Bowel sounds are normal. There is no distension.     Palpations: Abdomen is soft. There is no mass.     Tenderness: There is no abdominal tenderness. There is no guarding.  Musculoskeletal: Normal range of motion.        General: No deformity.  Lymphadenopathy:     Cervical: No cervical adenopathy.  Skin:    General: Skin is warm and dry.     Findings: No erythema or rash.     Comments: Right anterior chest wall + Mediport  Neurological:     Mental Status: She is alert and oriented to person, place, and time.     Cranial Nerves: No cranial nerve deficit.     Coordination: Coordination normal.  Psychiatric:          Behavior: Behavior normal.        Thought Content: Thought content normal.      LABORATORY DATA:  I have reviewed the data as listed Lab Results  Component Value Date   WBC 5.3 07/25/2018   HGB 12.0 07/25/2018   HCT 35.3 (L) 07/25/2018   MCV 100.0 07/25/2018   PLT 289 07/25/2018   Recent Labs    06/27/18 0908 07/11/18 0900 07/25/18 1011  NA 139 137 137  K 3.8 3.5 3.4*  CL 106 106 105  CO2 25 25 25   GLUCOSE 136* 116* 134*  BUN 6* 7* 6*  CREATININE 0.51 0.44 0.54  CALCIUM 8.9 8.7* 8.9  GFRNONAA >60 >60 >60  GFRAA >60 >60 >60  PROT 7.2 7.2 7.1  ALBUMIN 4.0 4.0 4.0  AST 20 17 20   ALT 20 20 21   ALKPHOS 104 103 104  BILITOT 0.5 0.5 0.6   RADIOGRAPHIC STUDIES: I have personally reviewed the radiological images as listed and agreed with the findings in the report. 12/20/2017 Repeat CT scan showed interval decreased of disease. CT images were Independently reviewed by me and discussed with patient. New 28mm right lung lesion, indeterminate.  02/25/2018 CT chest with contrast, presumed evolutionary changes of radiation therapy in the upper right hemithorax.  Aortic atherosclerosis, emphysema.  ASSESSMENT & PLAN:  Cancer Staging Non-small cell lung cancer ([place]) Staging form: Lung, AJCC 8th Edition - Clinical stage from 09/22/2017: Stage Unknown (cTX, cN1, cM0) - Signed by Earlie Server, MD on 09/22/2017 - Pathologic: No stage assigned - Unsigned  1. Non-small cell cancer of right lung ([place])   2. Encounter for antineoplastic immunotherapy   3. Port-A-Cath in place   4. Thrombosis   5. Chronic anticoagulation    #Clinical stage IIIA lung NSCLC, favoring adenocarcinoma Stable disease on most recent CT scan in January 2020. Tolerating immunotherapy well. No clinical signs of immunotherapy related side effects .  Labs reviewed and discussed with patient. Proceed with durvalumab treatment today. Plan total of 26 treatment. 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  Continue Eliquis 5 mg twice daily. She prefers to keep the port in so that she can receive immunotherapy very tight Mediport. She is aware about the option of taking out Mediport and receive Durvalumab via peripheral vein. Plan continue anticoagulation until she finishes 26 treatment of durvalumab. At that time will remove Mediport and patient can come off Eliquis after that.  RTC 2 weeks with repeat CBC and CMP. We spent sufficient time to discuss many aspect of care, questions were answered to patient's satisfaction. Total face to face encounter time for this patient visit was 25 min. >50% of the time was  spent in counseling and coordination of care.    Earlie Server, MD, PhD Hematology Oncology [facility] Pager- [phone] 07/25/2018

## 2018-07-25 NOTE — Progress Notes (Signed)
Patient here for follow up. No concerns voiced.  °

## 2018-08-08 ENCOUNTER — Inpatient Hospital Stay: Payer: Medicare HMO

## 2018-08-08 ENCOUNTER — Other Ambulatory Visit: Payer: Self-pay

## 2018-08-08 ENCOUNTER — Other Ambulatory Visit: Payer: Self-pay | Admitting: Internal Medicine

## 2018-08-08 ENCOUNTER — Inpatient Hospital Stay (HOSPITAL_BASED_OUTPATIENT_CLINIC_OR_DEPARTMENT_OTHER): Payer: Medicare HMO | Admitting: Nurse Practitioner

## 2018-08-08 ENCOUNTER — Inpatient Hospital Stay: Payer: Medicare HMO | Attending: Oncology

## 2018-08-08 ENCOUNTER — Encounter: Payer: Self-pay | Admitting: Nurse Practitioner

## 2018-08-08 VITALS — BP 143/83 | HR 85 | Temp 97.8°F | Resp 18 | Wt 187.0 lb

## 2018-08-08 DIAGNOSIS — I82B11 Acute embolism and thrombosis of right subclavian vein: Secondary | ICD-10-CM | POA: Diagnosis not present

## 2018-08-08 DIAGNOSIS — Z5112 Encounter for antineoplastic immunotherapy: Secondary | ICD-10-CM | POA: Insufficient documentation

## 2018-08-08 DIAGNOSIS — C3491 Malignant neoplasm of unspecified part of right bronchus or lung: Secondary | ICD-10-CM | POA: Diagnosis not present

## 2018-08-08 DIAGNOSIS — C3490 Malignant neoplasm of unspecified part of unspecified bronchus or lung: Secondary | ICD-10-CM

## 2018-08-08 DIAGNOSIS — Z87891 Personal history of nicotine dependence: Secondary | ICD-10-CM

## 2018-08-08 DIAGNOSIS — I10 Essential (primary) hypertension: Secondary | ICD-10-CM

## 2018-08-08 DIAGNOSIS — Z79899 Other long term (current) drug therapy: Secondary | ICD-10-CM | POA: Insufficient documentation

## 2018-08-08 DIAGNOSIS — Z7901 Long term (current) use of anticoagulants: Secondary | ICD-10-CM

## 2018-08-08 DIAGNOSIS — I82C11 Acute embolism and thrombosis of right internal jugular vein: Secondary | ICD-10-CM | POA: Diagnosis not present

## 2018-08-08 LAB — CBC WITH DIFFERENTIAL/PLATELET
Abs Immature Granulocytes: 0.01 10*3/uL (ref 0.00–0.07)
Basophils Absolute: 0 10*3/uL (ref 0.0–0.1)
Basophils Relative: 1 %
Eosinophils Absolute: 0.6 10*3/uL — ABNORMAL HIGH (ref 0.0–0.5)
Eosinophils Relative: 11 %
HCT: 36.7 % (ref 36.0–46.0)
Hemoglobin: 12.4 g/dL (ref 12.0–15.0)
Immature Granulocytes: 0 %
Lymphocytes Relative: 34 %
Lymphs Abs: 1.7 10*3/uL (ref 0.7–4.0)
MCH: 34.1 pg — ABNORMAL HIGH (ref 26.0–34.0)
MCHC: 33.8 g/dL (ref 30.0–36.0)
MCV: 100.8 fL — AB (ref 80.0–100.0)
MONOS PCT: 7 %
Monocytes Absolute: 0.3 10*3/uL (ref 0.1–1.0)
Neutro Abs: 2.3 10*3/uL (ref 1.7–7.7)
Neutrophils Relative %: 47 %
Platelets: 311 10*3/uL (ref 150–400)
RBC: 3.64 MIL/uL — ABNORMAL LOW (ref 3.87–5.11)
RDW: 12.6 % (ref 11.5–15.5)
WBC: 4.9 10*3/uL (ref 4.0–10.5)
nRBC: 0 % (ref 0.0–0.2)

## 2018-08-08 LAB — COMPREHENSIVE METABOLIC PANEL
ALT: 17 U/L (ref 0–44)
ANION GAP: 8 (ref 5–15)
AST: 15 U/L (ref 15–41)
Albumin: 4.1 g/dL (ref 3.5–5.0)
Alkaline Phosphatase: 96 U/L (ref 38–126)
BUN: 7 mg/dL — ABNORMAL LOW (ref 8–23)
CALCIUM: 8.6 mg/dL — AB (ref 8.9–10.3)
CO2: 24 mmol/L (ref 22–32)
Chloride: 107 mmol/L (ref 98–111)
Creatinine, Ser: 0.59 mg/dL (ref 0.44–1.00)
GFR calc Af Amer: >60 mL/min (ref >60)
Glucose, Bld: 126 mg/dL — ABNORMAL HIGH (ref 70–99)
Potassium: 3.5 mmol/L (ref 3.5–5.1)
Sodium: 139 mmol/L (ref 135–145)
Total Bilirubin: 0.3 mg/dL (ref 0.3–1.2)
Total Protein: 7.4 g/dL (ref 6.5–8.1)

## 2018-08-08 MED ORDER — SODIUM CHLORIDE 0.9% FLUSH
10.0000 mL | Freq: Once | INTRAVENOUS | Status: AC
  Administered 2018-08-08: 10 mL via INTRAVENOUS
  Filled 2018-08-08: qty 10

## 2018-08-08 MED ORDER — SODIUM CHLORIDE 0.9 % IV SOLN
Freq: Once | INTRAVENOUS | Status: AC
  Administered 2018-08-08: 10:00:00 via INTRAVENOUS
  Filled 2018-08-08: qty 250

## 2018-08-08 MED ORDER — HEPARIN SOD (PORK) LOCK FLUSH 100 UNIT/ML IV SOLN
500.0000 [IU] | Freq: Once | INTRAVENOUS | Status: AC
  Administered 2018-08-08: 500 [IU] via INTRAVENOUS

## 2018-08-08 MED ORDER — SODIUM CHLORIDE 0.9 % IV SOLN
10.0000 mg/kg | Freq: Once | INTRAVENOUS | Status: AC
  Administered 2018-08-08: 860 mg via INTRAVENOUS
  Filled 2018-08-08: qty 10

## 2018-08-08 NOTE — Progress Notes (Signed)
Hematology/Oncology follow up note [facility] [phone] [phone]   Patient Care Team: Sharyne Peach, MD as PCP - General (Family Medicine) Earlie Server, MD as Medical Oncologist (Medical Oncology) Telford Nab, RN as Registered Nurse  REASON FOR VISIT:  follow up/consideration of maintenance immunotherapy for treatment of stage IIIa non-small cell lung cancer   HISTORY OF PRESENTING ILLNESS:  Connie Powers is a  68 y.o.  female with PMH significant for brain aneurysm, hypertension, viral meningitis, smoking, who was referred to medical-oncology/Dr. 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   CT lung cancer screen 05/27/2017 - 1. A few bilateral pulmonary nodules measuring up to 5 mm are indeterminate. Considering the suspicious 2.0 cm mediastinal adenopathy, follow up with PET/CT is recommended. 2. Mild bronchial wall thickening could represent airway disease including bronchitis. ACR Lung-RADS Category and Recommendation*: ACR Lung-RADS Category 2S (S modifier for mediastinal adenopathy)  PET scan 3/112019- 1. No hypermetabolic pulmonary nodules.2. Hypermetabolic enlarged right paratracheal lymph node, of uncertain etiology in isolation. Lymphoproliferative disorder cannot be excluded. 3.  Aortic atherosclerosis  EBUS biopsy of paratracheal node showed non small cell lung cancer favoring adenocarcinoma.   Case was discussed on tumor board on 09/16/2017 and consensus recommendation is to treat as Stage IIIA disease given the invasion of mediastinum.   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 MRI brain is contraindicated. CT brain negative for metastatic disease.  During the interval patient also was referred to vascular surgery for evaluation of malposition of Medi port.  Patient's right jugular port was removed and right internal jugular vein Mediport was placed by Dr.Dew on 8/5 2019.  # CT chest  scan done 06/02/2018 which was independent reviewed by me. CT chest showed radiation changes in the medial right upper lobe/paramediastinal region.  No evidence of recurrent or metastatic disease. Nonocclusive pericatheter thrombus/fibrin sheath along patient's right chest port.  06/06/2018 US venous upper right extremity ultrasound showed nonocclusive thrombus in the right jugular vein.  The port catheter can be seen associated with this thrombus.  The jugular vein is partially occlusive.  There is also partially occlusive thrombus in the right subclavian vein.  The right axillary, brachial, radial, and ulnar veins are compressible.  Doppler analysis demonstrated phasicity of the Doppler waveform.  Patient was started on Eliquis for anticoagulation.   INTERVAL HISTORY Connie Powers is a 68 y.o. female with above history of stage IIIa non-small cell lung cancer who returns to clinic today for evaluation and consideration of continuation of maintenance immunotherapy.  She reports that she feels at baseline and denies specific complaints.  Specifically she denies peripheral edema, fatigue, rash, diarrhea, infection, cough, pain, fever or chills.  No shortness of breath.  She continues to abstain from smoking.  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  Reports she is tolerating well and denies any abnormal bleeding or bruising.  Denies nosebleeds.  Current Treatment S/p concurrent Chemotherapy [name] /taxol  Weekly x 6] and RT.   Started on maintenance durvalumab every 2 weeks on 12/27/2017. 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  Review of Systems  Constitutional: Negative for appetite change, chills, fatigue and fever.  HENT:   Negative for hearing loss and voice change.   Eyes: Negative for eye problems.  Respiratory: Negative for chest tightness and cough.   Cardiovascular: Negative for chest pain.    Gastrointestinal:  Negative for abdominal distention, abdominal pain and blood in stool.  Endocrine: Negative for hot flashes.  Genitourinary: Negative for difficulty urinating and frequency.   Musculoskeletal: Negative for arthralgias.  Skin: Negative for itching and rash.  Neurological: Negative for dizziness, extremity weakness and headaches.  Hematological: Negative for adenopathy.  Psychiatric/Behavioral: Negative for confusion and sleep disturbance.    MEDICAL HISTORY:  Past Medical History:  Diagnosis Date  . Brain aneurysm   . Difficult intubation   . Hypertension   . Non-small cell cancer of right lung ([place]) 09/22/2017   Chemo + Rad tx's.   . Viral meningitis     SURGICAL HISTORY: Past Surgical History:  Procedure Laterality Date  . ENDOBRONCHIAL ULTRASOUND N/A 09/09/2017   Procedure: ENDOBRONCHIAL ULTRASOUND;  Surgeon: Laverle Hobby, MD;  Location: [facility] ORS;  Service: Pulmonary;  Laterality: N/A;  . OOPHORECTOMY Left   . PORTA CATH INSERTION N/A 09/29/2017   Procedure: PORTA CATH INSERTION;  Surgeon: Algernon Huxley, MD;  Location: [place] [facility];  Service: Cardiovascular;  Laterality: N/A;  . PORTA CATH INSERTION N/A 01/03/2018   Procedure: PORTA CATH INSERTION;  Surgeon: Algernon Huxley, MD;  Location: [place] [facility];  Service: Cardiovascular;  Laterality: N/A;  . surgical repair for brain tumor  1969   clips in head  . WISDOM TOOTH EXTRACTION      SOCIAL HISTORY: Social History   Socioeconomic History  . Marital status: Married    Spouse name: Not on file  . Number of children: Not on file  . Years of education: Not on file  . Highest education level: Not on file  Occupational History  . Not on file  Social Needs  . Financial resource strain: Not on file  . Food insecurity:    Worry: Not on file    Inability: Not on file  . Transportation needs:    Medical: Not on file    Non-medical: Not on file  Tobacco Use  . Smoking status: Former  Smoker    Packs/day: 0.25    Last attempt to quit: 10/03/2017    Years since quitting: 0.8  . Smokeless tobacco: Never Used  Substance and Sexual Activity  . Alcohol use: Not Currently    Comment: social  . Drug use: Not Currently    Types: Cocaine  . Sexual activity: Not on file  Lifestyle  . Physical activity:    Days per week: Not on file    Minutes per session: Not on file  . Stress: Not on file  Relationships  . Social connections:    Talks on phone: Not on file    Gets together: Not on file    Attends religious service: Not on file    Active member of club or organization: Not on file    Attends meetings of clubs or organizations: Not on file    Relationship status: Not on file  . Intimate partner violence:    Fear of current or ex partner: Not on file    Emotionally abused: Not on file    Physically abused: Not on file    Forced sexual activity: Not on file  Other Topics Concern  . Not on file  Social History Narrative  . Not on file    FAMILY HISTORY: Family History  Problem Relation Age of Onset  . Diabetes Mother   . Hypertension Mother   . Hyperlipidemia Mother   . Arthritis Mother   . Hypertension Father   .  Hyperlipidemia Father   . Heart attack Father        x2  . Breast cancer Neg Hx     ALLERGIES:  has No Known Allergies.  MEDICATIONS:  Current Outpatient Medications  Medication Sig Dispense Refill  . amLODipine (NORVASC) 10 MG tablet Take 10 mg by mouth daily.  11  . apixaban (ELIQUIS) 5 MG TABS tablet Take 1 tablet (5 mg total) by mouth 2 (two) times daily. 60 tablet 0  . Cyanocobalamin (B-12) 1000 MCG CAPS Take 1,000 mcg by mouth daily. 30 capsule 3  . docusate sodium (COLACE) 100 MG capsule Take 100 mg by mouth daily.    Marland Kitchen lidocaine-prilocaine (EMLA) cream Apply to affected area once 30 g 3  . ondansetron (ZOFRAN) 8 MG tablet Take 1 tablet (8 mg total) by mouth 2 (two) times daily as needed for refractory nausea / vomiting. Start on day 3  after chemo. 30 tablet 1  . potassium chloride SA (K-DUR,KLOR-CON) 20 MEQ tablet Take 1 tablet (20 mEq total) by mouth daily. 3 tablet 0  . prochlorperazine (COMPAZINE) 10 MG tablet Take 1 tablet (10 mg total) by mouth every 6 (six) hours as needed (Nausea or vomiting). 30 tablet 1  . umeclidinium-vilanterol (ANORO ELLIPTA) 62.5-25 MCG/INH AEPB Inhale 1 puff into the lungs daily as needed (shortness of breath).      No current facility-administered medications for this visit.    Facility-Administered Medications Ordered in Other Visits  Medication Dose Route Frequency Provider Last Rate Last Dose  . heparin lock flush 100 unit/mL  500 Units Intravenous Once Earlie Server, MD         PHYSICAL EXAMINATION: ECOG PERFORMANCE STATUS: 0 - Asymptomatic Vitals:   08/08/18 0908  BP: (!) 143/83  Pulse: 85  Resp: 18  Temp: 97.8 F (36.6 C)  SpO2: 99%   Filed Weights   08/08/18 0908  Weight: 187 lb (84.8 kg)    Physical Exam Constitutional:      General: She is not in acute distress.    Comments: unaccompanied  HENT:     Head: Normocephalic and atraumatic.  Eyes:     General: No scleral icterus.       Left eye: No discharge.     Conjunctiva/sclera: Conjunctivae normal.     Pupils: Pupils are equal, round, and reactive to light.  Neck:     Musculoskeletal: Normal range of motion and neck supple.  Cardiovascular:     Rate and Rhythm: Normal rate and regular rhythm.     Heart sounds: Normal heart sounds. No murmur.  Pulmonary:     Effort: Pulmonary effort is normal. No respiratory distress.     Breath sounds: Normal breath sounds. No wheezing or rales.  Chest:     Chest wall: No tenderness.  Abdominal:     General: Bowel sounds are normal. There is no distension.     Palpations: Abdomen is soft. There is no mass.     Tenderness: There is no abdominal tenderness. There is no guarding.  Musculoskeletal: Normal range of motion.        General: No deformity.  Lymphadenopathy:      Cervical: No cervical adenopathy.  Skin:    General: Skin is warm and dry.     Findings: No erythema or rash.     Comments: Right anterior chest wall + Mediport  Neurological:     Mental Status: She is alert and oriented to person, place, and time.  Cranial Nerves: No cranial nerve deficit.     Coordination: Coordination normal.  Psychiatric:        Behavior: Behavior normal.        Thought Content: Thought content normal.     LABORATORY DATA:  I have reviewed the data as listed below:  Lab Results  Component Value Date   WBC 4.9 08/08/2018   HGB 12.4 08/08/2018   HCT 36.7 08/08/2018   MCV 100.8 (H) 08/08/2018   PLT 311 08/08/2018   Recent Labs    07/11/18 0900 07/25/18 1011 08/08/18 0839  NA 137 137 139  K 3.5 3.4* 3.5  CL 106 105 107  CO2 25 25 24   GLUCOSE 116* 134* 126*  BUN 7* 6* 7*  CREATININE 0.44 0.54 0.59  CALCIUM 8.7* 8.9 8.6*  GFRNONAA >60 >60 >60  GFRAA >60 >60 >60  PROT 7.2 7.1 7.4  ALBUMIN 4.0 4.0 4.1  AST 17 20 15   ALT 20 21 17   ALKPHOS 103 104 96  BILITOT 0.5 0.6 0.3   RADIOGRAPHIC STUDIES: 12/20/2017 Repeat CT scan showed interval decreased of disease. CT images were Independently reviewed by me and discussed with patient. New 80mm right lung lesion, indeterminate.   02/25/2018 CT chest with contrast, presumed evolutionary changes of radiation therapy in the upper right hemithorax.  Aortic atherosclerosis, emphysema.  06/02/2018-CT chest with contrast-no evidence of recurrent or metastatic disease.  Radiation changes in the medial right upper lobe/paramediastinal region.  Overall stable.  Additionally, nonocclusive pericatheter thrombus/fibrin sheath along right chest port.  06/06/2018-ultrasound right upper extremity-for incidental finding on prior CT.  Ultrasound positive for nonocclusive thrombus in the right subclavian and jugular veins.  I have personally reviewed the radiological findings as listed above and provided my interpretation.  I  agree with findings in the report.   ASSESSMENT & PLAN:  Cancer Staging Non-small cell lung cancer ([place]) Staging form: Lung, AJCC 8th Edition - Clinical stage from 09/22/2017: Stage Unknown (cTX, cN1, cM0) - Signed by Earlie Server, MD on 09/22/2017 - Pathologic: No stage assigned - Unsigned  No diagnosis found.   -Clinical stage IIIa lung non-small cell lung cancer, favoring adenocarcinoma. S/p concurrent weekly carbo-Taxol x 6 and radiation therapy.  Started maintenance nivolumab every 2 weeks on 12/27/2017. Imaging on 06/02/2018 showed stable disease.  Tolerating immunotherapy well.  Clinically, no signs of immunotherapy related side effects.  Labs today independently reviewed and discussed with patient and acceptable for treatment.  Proceed with durvalumab today.  Plan for total of 26 treatments.  Plan to repeat CT scan in April 2020.  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  Dr. Tasia Catchings previously discussed that option to remove Mediport and receive nivolumab peripherally.  However, patient prefers to keep port in place for administration of immunotherapy at this time.  Continue Eliquis 5 mg twice daily.  Plan to continue anticoagulation until she finishes treatment.  At that time, we will remove Mediport and stop Eliquis.  Return to clinic in 2 weeks for repeat CBC and CMP, reevaluation with Dr. Tasia Catchings and consideration of durvalumab.   Beckey Rutter, DNP, AGNP-C [facility] [phone] (work cell) [phone] (office)  CC: Dr. Tasia Catchings

## 2018-08-11 ENCOUNTER — Other Ambulatory Visit: Payer: Self-pay | Admitting: Oncology

## 2018-08-22 ENCOUNTER — Other Ambulatory Visit: Payer: Self-pay

## 2018-08-22 ENCOUNTER — Inpatient Hospital Stay (HOSPITAL_BASED_OUTPATIENT_CLINIC_OR_DEPARTMENT_OTHER): Payer: Medicare HMO | Admitting: Oncology

## 2018-08-22 ENCOUNTER — Inpatient Hospital Stay: Payer: Medicare HMO

## 2018-08-22 ENCOUNTER — Encounter: Payer: Self-pay | Admitting: Oncology

## 2018-08-22 VITALS — BP 128/84 | HR 82 | Temp 96.3°F | Resp 18 | Wt 186.3 lb

## 2018-08-22 DIAGNOSIS — R05 Cough: Secondary | ICD-10-CM

## 2018-08-22 DIAGNOSIS — Z95828 Presence of other vascular implants and grafts: Secondary | ICD-10-CM

## 2018-08-22 DIAGNOSIS — I82C11 Acute embolism and thrombosis of right internal jugular vein: Secondary | ICD-10-CM | POA: Diagnosis not present

## 2018-08-22 DIAGNOSIS — R059 Cough, unspecified: Secondary | ICD-10-CM

## 2018-08-22 DIAGNOSIS — C3491 Malignant neoplasm of unspecified part of right bronchus or lung: Secondary | ICD-10-CM | POA: Diagnosis not present

## 2018-08-22 DIAGNOSIS — I82B11 Acute embolism and thrombosis of right subclavian vein: Secondary | ICD-10-CM | POA: Diagnosis not present

## 2018-08-22 DIAGNOSIS — Z5112 Encounter for antineoplastic immunotherapy: Secondary | ICD-10-CM

## 2018-08-22 DIAGNOSIS — Z7901 Long term (current) use of anticoagulants: Secondary | ICD-10-CM

## 2018-08-22 DIAGNOSIS — I8290 Acute embolism and thrombosis of unspecified vein: Secondary | ICD-10-CM

## 2018-08-22 LAB — CBC WITH DIFFERENTIAL/PLATELET
Abs Immature Granulocytes: 0.01 10*3/uL (ref 0.00–0.07)
Basophils Absolute: 0 10*3/uL (ref 0.0–0.1)
Basophils Relative: 1 %
Eosinophils Absolute: 0.6 10*3/uL — ABNORMAL HIGH (ref 0.0–0.5)
Eosinophils Relative: 9 %
HEMATOCRIT: 36.9 % (ref 36.0–46.0)
Hemoglobin: 12.4 g/dL (ref 12.0–15.0)
Immature Granulocytes: 0 %
Lymphocytes Relative: 23 %
Lymphs Abs: 1.4 10*3/uL (ref 0.7–4.0)
MCH: 33.6 pg (ref 26.0–34.0)
MCHC: 33.6 g/dL (ref 30.0–36.0)
MCV: 100 fL (ref 80.0–100.0)
Monocytes Absolute: 0.4 10*3/uL (ref 0.1–1.0)
Monocytes Relative: 6 %
Neutro Abs: 3.8 10*3/uL (ref 1.7–7.7)
Neutrophils Relative %: 61 %
Platelets: 286 10*3/uL (ref 150–400)
RBC: 3.69 MIL/uL — ABNORMAL LOW (ref 3.87–5.11)
RDW: 12.4 % (ref 11.5–15.5)
WBC: 6.1 10*3/uL (ref 4.0–10.5)
nRBC: 0 % (ref 0.0–0.2)

## 2018-08-22 LAB — TSH: TSH: 1.81 u[IU]/mL (ref 0.350–4.500)

## 2018-08-22 LAB — COMPREHENSIVE METABOLIC PANEL
ALK PHOS: 100 U/L (ref 38–126)
ALT: 16 U/L (ref 0–44)
AST: 16 U/L (ref 15–41)
Albumin: 4 g/dL (ref 3.5–5.0)
Anion gap: 10 (ref 5–15)
BUN: 10 mg/dL (ref 8–23)
CALCIUM: 8.9 mg/dL (ref 8.9–10.3)
CO2: 23 mmol/L (ref 22–32)
Chloride: 106 mmol/L (ref 98–111)
Creatinine, Ser: 0.62 mg/dL (ref 0.44–1.00)
GFR calc non Af Amer: >60 mL/min (ref >60)
Glucose, Bld: 140 mg/dL — ABNORMAL HIGH (ref 70–99)
Potassium: 3.5 mmol/L (ref 3.5–5.1)
Sodium: 139 mmol/L (ref 135–145)
Total Bilirubin: 0.4 mg/dL (ref 0.3–1.2)
Total Protein: 7.3 g/dL (ref 6.5–8.1)

## 2018-08-22 MED ORDER — SODIUM CHLORIDE 0.9 % IV SOLN
10.0000 mg/kg | Freq: Once | INTRAVENOUS | Status: AC
  Administered 2018-08-22: 860 mg via INTRAVENOUS
  Filled 2018-08-22: qty 10

## 2018-08-22 MED ORDER — SODIUM CHLORIDE 0.9 % IV SOLN
Freq: Once | INTRAVENOUS | Status: AC
  Administered 2018-08-22: 10:00:00 via INTRAVENOUS
  Filled 2018-08-22: qty 250

## 2018-08-22 MED ORDER — SODIUM CHLORIDE 0.9% FLUSH
10.0000 mL | Freq: Once | INTRAVENOUS | Status: AC
  Administered 2018-08-22: 10 mL via INTRAVENOUS
  Filled 2018-08-22: qty 10

## 2018-08-22 MED ORDER — HEPARIN SOD (PORK) LOCK FLUSH 10 UNIT/ML IV SOLN
10.0000 [IU] | Freq: Once | INTRAVENOUS | Status: DC
  Filled 2018-08-22: qty 1

## 2018-08-22 MED ORDER — HEPARIN SOD (PORK) LOCK FLUSH 100 UNIT/ML IV SOLN
500.0000 [IU] | Freq: Once | INTRAVENOUS | Status: AC
  Administered 2018-08-22: 500 [IU] via INTRAVENOUS
  Filled 2018-08-22: qty 5

## 2018-08-22 NOTE — Progress Notes (Signed)
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   HISTORY OF PRESENTING ILLNESS:  Connie Powers is a  68 y.o.  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  CT lung cancer screen 05/27/2017   1. A few bilateral pulmonary nodules measuring up to 5 mm are indeterminate. Considering the suspicious 2.0 cm mediastinal adenopathy, follow up with PET/CT is recommended. 2. Mild bronchial wall thickening could represent airway disease including bronchitis.ACR Lung-RADS Category and Recommendation*: ACR Lung-RADS Category 2S (S modifier for mediastinal adenopathy)  PET scan 3/112019 1. No hypermetabolic pulmonary nodules.2. Hypermetabolic enlarged right paratracheal lymph node, of uncertain etiology in isolation. Lymphoproliferative disorder cannot be excluded. 3.  Aortic atherosclerosis (ICD10-170.0).  EBUS biopsy of paratracheal node showed non small cell lung cancer favoring adenocarcinoma. Case was discussed on tumor board on 09/16/2017 and consensus recommendation is to treat as Stage IIIA disease given the invasion of mediastinum.   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 MRI brain is contraindicated. CT brain negative for metastatic disease.  During the interval patient also was referred to vascular surgery for evaluation of malposition of Medi port.  Patient's right jugular port was removed and right internal jugular vein Mediport was placed by Dr.Dew on 8/5 2019.  # CT chest scan done 06/02/2018 which was independent reviewed by me. CT chest showed radiation changes  in the medial right upper lobe/paramediastinal region.  No evidence of recurrent or metastatic disease. Nonocclusive pericatheter thrombus/fibrin sheath along patient's right chest port. 06/06/2018 US venous upper right extremity ultrasound showed nonocclusive thrombus in the right jugular vein.  The port catheter can be seen associated with this thrombus.  The jugular vein is partially occlusive.  There is also partially occlusive thrombus in the right subclavian vein.  The right axillary, brachial, radial, and ulnar veins are compressible.  Doppler analysis demonstrated phasicity of the Doppler waveform. Patient was started on Eliquis for anticoagulation.   INTERVAL HISTORY Connie Powers is a 68 y.o. female who has above history reviewed by me today presents for assessment prior to maintenance immunotherapy for stage IIIa non-small cell lung cancer. Patient reports doing well.  Tolerating immunotherapy well.  Denies any diarrhea, skin rash, shortness of breath. Chronic cough with whitish sputum at baseline. No new complaints today. She has created smoking. 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  Tolerating well.  No bleeding events. She is not interested in having Mediport removed at this point.   Current Treatment S/p concurrent Chemotherapy [name] /taxol  Weekly x 6] and RT.  Started on maintenance durvalumab every 2 weeks on 12/27/2017. 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  Review of Systems  Constitutional: Negative for appetite change, chills, fatigue and fever.  HENT:   Negative for hearing loss and voice change.   Eyes: Negative for eye problems.  Respiratory: Positive for cough. Negative for chest tightness.   Cardiovascular: Negative for chest pain.  Gastrointestinal: Negative for abdominal distention, abdominal pain and blood in stool.  Endocrine: Negative for hot  flashes.  Genitourinary: Negative for difficulty urinating and frequency.   Musculoskeletal: Negative for arthralgias.  Skin: Negative for itching and rash.  Neurological: Negative for extremity weakness.  Hematological: Negative for adenopathy.  Psychiatric/Behavioral: Negative for confusion.    MEDICAL HISTORY:  Past Medical History:  Diagnosis Date  . Brain aneurysm   . Difficult intubation   . Hypertension   . Non-small cell cancer of right lung ([place]) 09/22/2017   Chemo + Rad tx's.   . Viral meningitis     SURGICAL HISTORY: Past Surgical History:  Procedure Laterality Date  . ENDOBRONCHIAL ULTRASOUND N/A 09/09/2017   Procedure: ENDOBRONCHIAL ULTRASOUND;  Surgeon: Laverle Hobby, MD;  Location: [facility] OR[facility];  Service: Pulmonary;  Laterality: N/A;  . OOPHORECTOMY Left   . PORTA CATH INSERTION N/A 09/29/2017   Procedure: PORTA CATH INSERTION;  Surgeon: Algernon Huxley, MD;  Location: [place] [facility];  Service: Cardiovascular;  Laterality: N/A;  . PORTA CATH INSERTION N/A 01/03/2018   Procedure: PORTA CATH INSERTION;  Surgeon: Algernon Huxley, MD;  Location: [place] [facility];  Service: Cardiovascular;  Laterality: N/A;  . surgical repair for brain tumor  1969   clips in head  . WISDOM TOOTH EXTRACTION      SOCIAL HISTORY: Social History   Socioeconomic History  . Marital status: Married    Spouse name: Not on file  . Number of children: Not on file  . Years of education: Not on file  . Highest education level: Not on file  Occupational History  . Not on file  Social Needs  . Financial resource strain: Not on file  . Food insecurity:    Worry: Not on file    Inability: Not on file  . Transportation needs:    Medical: Not on file    Non-medical: Not on file  Tobacco Use  . Smoking status: Former Smoker    Packs/day: 0.25    Last attempt to quit: 10/03/2017    Years since quitting: 0.8  . Smokeless tobacco: Never Used  Substance and Sexual Activity  . Alcohol  use: Not Currently    Comment: social  . Drug use: Not Currently    Types: Cocaine  . Sexual activity: Not on file  Lifestyle  . Physical activity:    Days per week: Not on file    Minutes per session: Not on file  . Stress: Not on file  Relationships  . Social connections:    Talks on phone: Not on file    Gets together: Not on file    Attends religious service: Not on file    Active member of club or organization: Not on file    Attends meetings of clubs or organizations: Not on file    Relationship status: Not on file  . Intimate partner violence:    Fear of current or ex partner: Not on file    Emotionally abused: Not on file    Physically abused: Not on file    Forced sexual activity: Not on file  Other Topics Concern  . Not on file  Social History Narrative  . Not on file    FAMILY HISTORY: Family History  Problem Relation Age of Onset  . Diabetes Mother   . Hypertension Mother   . Hyperlipidemia Mother   . Arthritis Mother   . Hypertension Father   . Hyperlipidemia Father   . Heart attack Father        x2  . Breast cancer Neg  Hx     ALLERGIES:  has No Known Allergies.  MEDICATIONS:  Current Outpatient Medications  Medication Sig Dispense Refill  . amLODipine (NORVASC) 10 MG tablet Take 10 mg by mouth daily.  11  . Cyanocobalamin (B-12) 1000 MCG CAPS Take 1,000 mcg by mouth daily. 30 capsule 3  . docusate sodium (COLACE) 100 MG capsule Take 100 mg by mouth daily.    Marland Kitchen ELIQUIS 5 MG TABS tablet Take 1 tablet by mouth twice daily 60 tablet 0  . lidocaine-prilocaine (EMLA) cream Apply to affected area once 30 g 3  . ondansetron (ZOFRAN) 8 MG tablet Take 1 tablet (8 mg total) by mouth 2 (two) times daily as needed for refractory nausea / vomiting. Start on day 3 after chemo. 30 tablet 1  . potassium chloride SA (K-DUR,KLOR-CON) 20 MEQ tablet Take 1 tablet (20 mEq total) by mouth daily. 3 tablet 0  . prochlorperazine (COMPAZINE) 10 MG tablet Take 1 tablet (10 mg  total) by mouth every 6 (six) hours as needed (Nausea or vomiting). 30 tablet 1  . umeclidinium-vilanterol (ANORO ELLIPTA) 62.5-25 MCG/INH AEPB Inhale 1 puff into the lungs daily as needed (shortness of breath).      No current facility-administered medications for this visit.    Facility-Administered Medications Ordered in Other Visits  Medication Dose Route Frequency Provider Last Rate Last Dose  . heparin flush 10 UNIT/ML injection 10 Units  10 Units Intracatheter Once Earlie Server, MD      . heparin lock flush 100 unit/mL  500 Units Intravenous Once Earlie Server, MD         PHYSICAL EXAMINATION: ECOG PERFORMANCE STATUS: 0 - Asymptomatic Vitals:   08/22/18 0924  BP: 128/84  Pulse: 82  Resp: 18  Temp: (!) 96.3 F (35.7 C)  SpO2: 98%   Filed Weights   08/22/18 0924  Weight: 186 lb 4.8 oz (84.5 kg)    Physical Exam Constitutional:      General: She is not in acute distress. HENT:     Head: Normocephalic and atraumatic.  Eyes:     General: No scleral icterus.       Left eye: No discharge.     Conjunctiva/sclera: Conjunctivae normal.     Pupils: Pupils are equal, round, and reactive to light.  Neck:     Musculoskeletal: Normal range of motion and neck supple.  Cardiovascular:     Rate and Rhythm: Normal rate and regular rhythm.     Heart sounds: Normal heart sounds. No murmur.  Pulmonary:     Effort: Pulmonary effort is normal. No respiratory distress.     Breath sounds: Normal breath sounds. No wheezing or rales.  Chest:     Chest wall: No tenderness.  Abdominal:     General: Bowel sounds are normal. There is no distension.     Palpations: Abdomen is soft. There is no mass.     Tenderness: There is no abdominal tenderness. There is no guarding.  Musculoskeletal: Normal range of motion.        General: No deformity.  Lymphadenopathy:     Cervical: No cervical adenopathy.  Skin:    General: Skin is warm and dry.     Findings: No erythema or rash.     Comments: Right  anterior chest wall + Mediport  Neurological:     Mental Status: She is alert and oriented to person, place, and time.     Cranial Nerves: No cranial nerve deficit.  Coordination: Coordination normal.  Psychiatric:        Behavior: Behavior normal.        Thought Content: Thought content normal.      LABORATORY DATA:  I have reviewed the data as listed Lab Results  Component Value Date   WBC 6.1 08/22/2018   HGB 12.4 08/22/2018   HCT 36.9 08/22/2018   MCV 100.0 08/22/2018   PLT 286 08/22/2018   Recent Labs    07/25/18 1011 08/08/18 0839 08/22/18 0858  NA 137 139 139  K 3.4* 3.5 3.5  CL 105 107 106  CO2 25 24 23   GLUCOSE 134* 126* 140*  BUN 6* 7* 10  CREATININE 0.54 0.59 0.62  CALCIUM 8.9 8.6* 8.9  GFRNONAA >60 >60 >60  GFRAA >60 >60 >60  PROT 7.1 7.4 7.3  ALBUMIN 4.0 4.1 4.0  AST 20 15 16   ALT 21 17 16   ALKPHOS 104 96 100  BILITOT 0.6 0.3 0.4   RADIOGRAPHIC STUDIES: I have personally reviewed the radiological images as listed and agreed with the findings in the report. 12/20/2017 Repeat CT scan showed interval decreased of disease. CT images were Independently reviewed by me and discussed with patient. New 57mm right lung lesion, indeterminate.  02/25/2018 CT chest with contrast, presumed evolutionary changes of radiation therapy in the upper right hemithorax.  Aortic atherosclerosis, emphysema.  ASSESSMENT & PLAN:  Cancer Staging Non-small cell lung cancer ([place]) Staging form: Lung, AJCC 8th Edition - Clinical stage from 09/22/2017: Stage Unknown (cTX, cN1, cM0) - Signed by Earlie Server, MD on 09/22/2017 - Pathologic: No stage assigned - Unsigned  1. Non-small cell cancer of right lung ([place])   2. Encounter for antineoplastic immunotherapy   3. Port-A-Cath in place   4. Chronic anticoagulation   5. Thrombosis    #Clinical stage IIIA lung NSCLC, favoring adenocarcinoma Stable disease on most recent CT scan in January 2020. Tolerating immunotherapy well. No  clinical signs of immunotherapy related side effects Labs reviewed and discussed with patient. Proceed with durvalumab treatment today. Plan total of 26 treatment. Repeat CT scan in April 2020 . 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  Continue Eliquis 5 mg twice daily. After he finish total 26 immunotherapy, plan to remove Mediport and then she will be taken off anticoagulation.  RTC 2 weeks with repeat CBC and CMP. We spent sufficient time to discuss many aspect of care, questions were answered to patient's satisfaction. Total face to face encounter time for this patient visit was 25 min. >50% of the time was  spent in counseling and coordination of care.     Earlie Server, MD, PhD Hematology Oncology [facility] Pager- [phone] 08/22/2018

## 2018-08-22 NOTE — Progress Notes (Signed)
Patient here for follow up. No concerns voiced.  °

## 2018-09-05 ENCOUNTER — Inpatient Hospital Stay (HOSPITAL_BASED_OUTPATIENT_CLINIC_OR_DEPARTMENT_OTHER): Payer: Medicare HMO | Admitting: Oncology

## 2018-09-05 ENCOUNTER — Other Ambulatory Visit: Payer: Self-pay

## 2018-09-05 ENCOUNTER — Inpatient Hospital Stay: Payer: Medicare HMO

## 2018-09-05 ENCOUNTER — Inpatient Hospital Stay: Payer: Medicare HMO | Attending: Oncology

## 2018-09-05 ENCOUNTER — Encounter: Payer: Self-pay | Admitting: Oncology

## 2018-09-05 VITALS — BP 126/76 | HR 81 | Temp 96.9°F | Resp 18 | Wt 186.0 lb

## 2018-09-05 DIAGNOSIS — Z5112 Encounter for antineoplastic immunotherapy: Secondary | ICD-10-CM | POA: Insufficient documentation

## 2018-09-05 DIAGNOSIS — Z7901 Long term (current) use of anticoagulants: Secondary | ICD-10-CM

## 2018-09-05 DIAGNOSIS — I82B11 Acute embolism and thrombosis of right subclavian vein: Secondary | ICD-10-CM

## 2018-09-05 DIAGNOSIS — Z79899 Other long term (current) drug therapy: Secondary | ICD-10-CM | POA: Insufficient documentation

## 2018-09-05 DIAGNOSIS — C3491 Malignant neoplasm of unspecified part of right bronchus or lung: Secondary | ICD-10-CM

## 2018-09-05 DIAGNOSIS — R05 Cough: Secondary | ICD-10-CM

## 2018-09-05 DIAGNOSIS — I8290 Acute embolism and thrombosis of unspecified vein: Secondary | ICD-10-CM

## 2018-09-05 DIAGNOSIS — Z87891 Personal history of nicotine dependence: Secondary | ICD-10-CM

## 2018-09-05 DIAGNOSIS — I82C11 Acute embolism and thrombosis of right internal jugular vein: Secondary | ICD-10-CM | POA: Diagnosis not present

## 2018-09-05 LAB — COMPREHENSIVE METABOLIC PANEL
ALT: 18 U/L (ref 0–44)
AST: 16 U/L (ref 15–41)
Albumin: 4 g/dL (ref 3.5–5.0)
Alkaline Phosphatase: 104 U/L (ref 38–126)
Anion gap: 7 (ref 5–15)
BUN: 7 mg/dL — ABNORMAL LOW (ref 8–23)
CO2: 24 mmol/L (ref 22–32)
Calcium: 8.8 mg/dL — ABNORMAL LOW (ref 8.9–10.3)
Chloride: 106 mmol/L (ref 98–111)
Creatinine, Ser: 0.55 mg/dL (ref 0.44–1.00)
GFR calc Af Amer: >60 mL/min (ref >60)
GFR calc non Af Amer: >60 mL/min (ref >60)
Glucose, Bld: 124 mg/dL — ABNORMAL HIGH (ref 70–99)
Potassium: 3.2 mmol/L — ABNORMAL LOW (ref 3.5–5.1)
Sodium: 137 mmol/L (ref 135–145)
Total Bilirubin: 0.5 mg/dL (ref 0.3–1.2)
Total Protein: 7.1 g/dL (ref 6.5–8.1)

## 2018-09-05 LAB — CBC WITH DIFFERENTIAL/PLATELET
Abs Immature Granulocytes: 0.02 10*3/uL (ref 0.00–0.07)
Basophils Absolute: 0 10*3/uL (ref 0.0–0.1)
Basophils Relative: 1 %
Eosinophils Absolute: 0.5 10*3/uL (ref 0.0–0.5)
Eosinophils Relative: 9 %
HCT: 35.2 % — ABNORMAL LOW (ref 36.0–46.0)
Hemoglobin: 12.1 g/dL (ref 12.0–15.0)
Immature Granulocytes: 0 %
Lymphocytes Relative: 25 %
Lymphs Abs: 1.5 10*3/uL (ref 0.7–4.0)
MCH: 34.4 pg — ABNORMAL HIGH (ref 26.0–34.0)
MCHC: 34.4 g/dL (ref 30.0–36.0)
MCV: 100 fL (ref 80.0–100.0)
Monocytes Absolute: 0.3 10*3/uL (ref 0.1–1.0)
Monocytes Relative: 6 %
Neutro Abs: 3.4 10*3/uL (ref 1.7–7.7)
Neutrophils Relative %: 59 %
Platelets: 298 10*3/uL (ref 150–400)
RBC: 3.52 MIL/uL — ABNORMAL LOW (ref 3.87–5.11)
RDW: 12.2 % (ref 11.5–15.5)
WBC: 5.8 10*3/uL (ref 4.0–10.5)
nRBC: 0 % (ref 0.0–0.2)

## 2018-09-05 MED ORDER — POTASSIUM CHLORIDE CRYS ER 20 MEQ PO TBCR: 20.0000 meq | EXTENDED_RELEASE_TABLET | Freq: Every day | ORAL | 0 refills | Status: DC

## 2018-09-05 MED ORDER — SODIUM CHLORIDE 0.9 % IV SOLN
Freq: Once | INTRAVENOUS | Status: AC
  Administered 2018-09-05: 10:00:00 via INTRAVENOUS
  Filled 2018-09-05: qty 250

## 2018-09-05 MED ORDER — SODIUM CHLORIDE 0.9 % IV SOLN
10.0000 mg/kg | Freq: Once | INTRAVENOUS | Status: AC
  Administered 2018-09-05: 10:00:00 860 mg via INTRAVENOUS
  Filled 2018-09-05: qty 7.2

## 2018-09-05 MED ORDER — HEPARIN SOD (PORK) LOCK FLUSH 100 UNIT/ML IV SOLN
500.0000 [IU] | Freq: Once | INTRAVENOUS | Status: AC
  Administered 2018-09-05: 12:00:00 500 [IU] via INTRAVENOUS
  Filled 2018-09-05: qty 5

## 2018-09-05 MED ORDER — SODIUM CHLORIDE 0.9% FLUSH
10.0000 mL | INTRAVENOUS | Status: DC | PRN
  Administered 2018-09-05: 09:00:00 10 mL via INTRAVENOUS
  Filled 2018-09-05: qty 10

## 2018-09-05 NOTE — Progress Notes (Signed)
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   HISTORY OF PRESENTING ILLNESS:  Connie Powers is a  68 y.o.  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  CT lung cancer screen 05/27/2017   1. A few bilateral pulmonary nodules measuring up to 5 mm are indeterminate. Considering the suspicious 2.0 cm mediastinal adenopathy, follow up with PET/CT is recommended. 2. Mild bronchial wall thickening could represent airway disease including bronchitis.ACR Lung-RADS Category and Recommendation*: ACR Lung-RADS Category 2S (S modifier for mediastinal adenopathy)  PET scan 3/112019 1. No hypermetabolic pulmonary nodules.2. Hypermetabolic enlarged right paratracheal lymph node, of uncertain etiology in isolation. Lymphoproliferative disorder cannot be excluded. 3.  Aortic atherosclerosis (ICD10-170.0).  EBUS biopsy of paratracheal node showed non small cell lung cancer favoring adenocarcinoma. Case was discussed on tumor board on 09/16/2017 and consensus recommendation is to treat as Stage IIIA disease given the invasion of mediastinum.   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 MRI brain is contraindicated. CT brain negative for metastatic disease.  During the interval patient also was referred to vascular surgery for evaluation of malposition of Medi port.  Patient's right jugular port was removed and right internal jugular vein Mediport was placed by Dr.Dew on 8/5 2019.  # CT chest scan done 06/02/2018 which was independent reviewed by me. CT chest showed radiation changes  in the medial right upper lobe/paramediastinal region.  No evidence of recurrent or metastatic disease. Nonocclusive pericatheter thrombus/fibrin sheath along patient's right chest port. 06/06/2018 US venous upper right extremity ultrasound showed nonocclusive thrombus in the right jugular vein.  The port catheter can be seen associated with this thrombus.  The jugular vein is partially occlusive.  There is also partially occlusive thrombus in the right subclavian vein.  The right axillary, brachial, radial, and ulnar veins are compressible.  Doppler analysis demonstrated phasicity of the Doppler waveform. Patient was started on Eliquis for anticoagulation.   INTERVAL HISTORY Connie Powers is a 68 y.o. female who has above history reviewed by me today presents for assessment prior to maintenance immunotherapy for stage IIIa non-small cell lung cancer.  #Patient reports doing well.  She had a dry cough which just started when she was in the exam room.  She attributes to the mask she was asked to put on. Also feels left side neck pain, intermittent, for couple of weeks, usually happens at night.  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 Tolerates well.  No bleeding events. She is not interested in having Mediport removed at this point.   Current Treatment S/p concurrent Chemotherapy [name] /taxol  Weekly x 6] and RT.  Started on maintenance durvalumab every 2 weeks on 12/27/2017. 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  Review of Systems  Constitutional: Negative for appetite change, chills, fatigue and fever.  HENT:   Negative for hearing loss and voice change.   Eyes: Negative for eye problems.  Respiratory: Positive for cough. Negative for chest tightness.   Cardiovascular: Negative for chest pain.  Gastrointestinal:  Negative for abdominal distention, abdominal pain and blood in stool.   Endocrine: Negative for hot flashes.  Genitourinary: Negative for difficulty urinating and frequency.   Musculoskeletal: Negative for arthralgias.  Skin: Negative for itching and rash.  Neurological: Negative for extremity weakness.  Hematological: Negative for adenopathy.  Psychiatric/Behavioral: Negative for confusion.    MEDICAL HISTORY:  Past Medical History:  Diagnosis Date  . Brain aneurysm   . Difficult intubation   . Hypertension   . Non-small cell cancer of right lung ([place]) 09/22/2017   Chemo + Rad tx's.   . Viral meningitis     SURGICAL HISTORY: Past Surgical History:  Procedure Laterality Date  . ENDOBRONCHIAL ULTRASOUND N/A 09/09/2017   Procedure: ENDOBRONCHIAL ULTRASOUND;  Surgeon: Laverle Hobby, MD;  Location: [facility] ORS;  Service: Pulmonary;  Laterality: N/A;  . OOPHORECTOMY Left   . PORTA CATH INSERTION N/A 09/29/2017   Procedure: PORTA CATH INSERTION;  Surgeon: Algernon Huxley, MD;  Location: [place] [facility];  Service: Cardiovascular;  Laterality: N/A;  . PORTA CATH INSERTION N/A 01/03/2018   Procedure: PORTA CATH INSERTION;  Surgeon: Algernon Huxley, MD;  Location: [place] [facility];  Service: Cardiovascular;  Laterality: N/A;  . surgical repair for brain tumor  1969   clips in head  . WISDOM TOOTH EXTRACTION      SOCIAL HISTORY: Social History   Socioeconomic History  . Marital status: Married    Spouse name: Not on file  . Number of children: Not on file  . Years of education: Not on file  . Highest education level: Not on file  Occupational History  . Not on file  Social Needs  . Financial resource strain: Not on file  . Food insecurity:    Worry: Not on file    Inability: Not on file  . Transportation needs:    Medical: Not on file    Non-medical: Not on file  Tobacco Use  . Smoking status: Former Smoker    Packs/day: 0.25    Last attempt to quit: 10/03/2017    Years since quitting: 0.9  . Smokeless tobacco: Never Used  Substance and  Sexual Activity  . Alcohol use: Not Currently    Comment: social  . Drug use: Not Currently    Types: Cocaine  . Sexual activity: Not on file  Lifestyle  . Physical activity:    Days per week: Not on file    Minutes per session: Not on file  . Stress: Not on file  Relationships  . Social connections:    Talks on phone: Not on file    Gets together: Not on file    Attends religious service: Not on file    Active member of club or organization: Not on file    Attends meetings of clubs or organizations: Not on file    Relationship status: Not on file  . Intimate partner violence:    Fear of current or ex partner: Not on file    Emotionally abused: Not on file    Physically abused: Not on file    Forced sexual activity: Not on file  Other Topics Concern  . Not on file  Social History Narrative  . Not on file    FAMILY HISTORY: Family History  Problem Relation Age of Onset  . Diabetes Mother   . Hypertension Mother   . Hyperlipidemia Mother   . Arthritis Mother   . Hypertension Father   . Hyperlipidemia Father   .  Heart attack Father        x2  . Breast cancer Neg Hx     ALLERGIES:  has No Known Allergies.  MEDICATIONS:  Current Outpatient Medications  Medication Sig Dispense Refill  . amLODipine (NORVASC) 10 MG tablet Take 10 mg by mouth daily.  11  . Cyanocobalamin (B-12) 1000 MCG CAPS Take 1,000 mcg by mouth daily. 30 capsule 3  . docusate sodium (COLACE) 100 MG capsule Take 100 mg by mouth daily.    Marland Kitchen ELIQUIS 5 MG TABS tablet Take 1 tablet by mouth twice daily 60 tablet 0  . lidocaine-prilocaine (EMLA) cream Apply to affected area once 30 g 3  . ondansetron (ZOFRAN) 8 MG tablet Take 1 tablet (8 mg total) by mouth 2 (two) times daily as needed for refractory nausea / vomiting. Start on day 3 after chemo. 30 tablet 1  . potassium chloride SA (K-DUR,KLOR-CON) 20 MEQ tablet Take 1 tablet (20 mEq total) by mouth daily. 3 tablet 0  . prochlorperazine (COMPAZINE) 10 MG  tablet Take 1 tablet (10 mg total) by mouth every 6 (six) hours as needed (Nausea or vomiting). 30 tablet 1  . umeclidinium-vilanterol (ANORO ELLIPTA) 62.5-25 MCG/INH AEPB Inhale 1 puff into the lungs daily as needed (shortness of breath).      No current facility-administered medications for this visit.    Facility-Administered Medications Ordered in Other Visits  Medication Dose Route Frequency Provider Last Rate Last Dose  . heparin lock flush 100 unit/mL  500 Units Intravenous Once Earlie Server, MD      . sodium chloride flush (NS) 0.9 % injection 10 mL  10 mL Intravenous PRN Earlie Server, MD   10 mL at 09/05/18 0843     PHYSICAL EXAMINATION: ECOG PERFORMANCE STATUS: 0 - Asymptomatic Vitals:   09/05/18 0903  BP: 126/76  Pulse: 81  Resp: 18  Temp: (!) 96.9 F (36.1 C)  SpO2: 100%   Filed Weights   09/05/18 0903  Weight: 186 lb (84.4 kg)    Physical Exam Constitutional:      General: She is not in acute distress. HENT:     Head: Normocephalic and atraumatic.  Eyes:     General: No scleral icterus.       Left eye: No discharge.     Conjunctiva/sclera: Conjunctivae normal.     Pupils: Pupils are equal, round, and reactive to light.  Neck:     Musculoskeletal: Normal range of motion and neck supple.  Cardiovascular:     Rate and Rhythm: Normal rate and regular rhythm.     Heart sounds: Normal heart sounds. No murmur.  Pulmonary:     Effort: Pulmonary effort is normal. No respiratory distress.     Breath sounds: Normal breath sounds. No wheezing or rales.  Chest:     Chest wall: No tenderness.  Abdominal:     General: Bowel sounds are normal. There is no distension.     Palpations: Abdomen is soft. There is no mass.     Tenderness: There is no abdominal tenderness. There is no guarding.  Musculoskeletal: Normal range of motion.        General: No deformity.  Lymphadenopathy:     Cervical: No cervical adenopathy.  Skin:    General: Skin is warm and dry.     Findings: No  erythema or rash.     Comments: Right anterior chest wall + Mediport  Neurological:     Mental Status: She is alert and oriented  to person, place, and time.     Cranial Nerves: No cranial nerve deficit.     Coordination: Coordination normal.  Psychiatric:        Behavior: Behavior normal.        Thought Content: Thought content normal.      LABORATORY DATA:  I have reviewed the data as listed Lab Results  Component Value Date   WBC 5.8 09/05/2018   HGB 12.1 09/05/2018   HCT 35.2 (L) 09/05/2018   MCV 100.0 09/05/2018   PLT 298 09/05/2018   Recent Labs    08/08/18 0839 08/22/18 0858 09/05/18 0843  NA 139 139 137  K 3.5 3.5 3.2*  CL 107 106 106  CO2 24 23 24   GLUCOSE 126* 140* 124*  BUN 7* 10 7*  CREATININE 0.59 0.62 0.55  CALCIUM 8.6* 8.9 8.8*  GFRNONAA >60 >60 >60  GFRAA >60 >60 >60  PROT 7.4 7.3 7.1  ALBUMIN 4.1 4.0 4.0  AST 15 16 16   ALT 17 16 18   ALKPHOS 96 100 104  BILITOT 0.3 0.4 0.5   RADIOGRAPHIC STUDIES: I have personally reviewed the radiological images as listed and agreed with the findings in the report. 12/20/2017 Repeat CT scan showed interval decreased of disease. CT images were Independently reviewed by me and discussed with patient. New 72mm right lung lesion, indeterminate.  02/25/2018 CT chest with contrast, presumed evolutionary changes of radiation therapy in the upper right hemithorax.  Aortic atherosclerosis, emphysema.  ASSESSMENT & PLAN:  Cancer Staging Non-small cell lung cancer ([place]) Staging form: Lung, AJCC 8th Edition - Clinical stage from 09/22/2017: Stage Unknown (cTX, cN1, cM0) - Signed by Earlie Server, MD on 09/22/2017 - Pathologic: No stage assigned - Unsigned  1. Non-small cell cancer of right lung ([place])   2. Encounter for antineoplastic immunotherapy   3. Chronic anticoagulation   4. Thrombosis    #Clinical stage IIIA lung NSCLC, favoring adenocarcinoma Stable disease on most recent CT scan in January 2020. Patient tolerated  immunotherapy well. No clinical signs of immunotherapy related side effects. Labs are reviewed and discussed with patient. Proceed with maintenance durvalumab treatment today She will need to repeat CT scan after next visit.  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  She is not interested in removing catheter. Tolerates chronic anticoagulation well.  Continue Eliquis 5 mg twice daily. After he finish total 26 immunotherapy, plan to remove Mediport and then she will be taken off anticoagulation.  RTC 2 weeks with repeat CBC and CMP. We spent sufficient time to discuss many aspect of care, questions were answered to patient's satisfaction. Total face to face encounter time for this patient visit was 25 min. >50% of the time was  spent in counseling and coordination of care.    Earlie Server, MD, PhD Hematology Oncology [facility] at [facility] Pager- [phone] 09/05/2018

## 2018-09-05 NOTE — Progress Notes (Signed)
Patient here for follow up. Pt complains of new onset of intermittent pain to left side of neck. This has been going on for a couple of weeks and usually happens at night.

## 2018-09-19 ENCOUNTER — Inpatient Hospital Stay (HOSPITAL_BASED_OUTPATIENT_CLINIC_OR_DEPARTMENT_OTHER): Payer: Medicare HMO | Admitting: Oncology

## 2018-09-19 ENCOUNTER — Inpatient Hospital Stay: Payer: Medicare HMO

## 2018-09-19 ENCOUNTER — Encounter: Payer: Self-pay | Admitting: Oncology

## 2018-09-19 ENCOUNTER — Other Ambulatory Visit: Payer: Self-pay

## 2018-09-19 VITALS — BP 135/84 | HR 76 | Temp 96.3°F | Resp 18 | Wt 185.9 lb

## 2018-09-19 DIAGNOSIS — R059 Cough, unspecified: Secondary | ICD-10-CM

## 2018-09-19 DIAGNOSIS — Z7901 Long term (current) use of anticoagulants: Secondary | ICD-10-CM

## 2018-09-19 DIAGNOSIS — D7589 Other specified diseases of blood and blood-forming organs: Secondary | ICD-10-CM | POA: Diagnosis not present

## 2018-09-19 DIAGNOSIS — Z5112 Encounter for antineoplastic immunotherapy: Secondary | ICD-10-CM

## 2018-09-19 DIAGNOSIS — Z87891 Personal history of nicotine dependence: Secondary | ICD-10-CM

## 2018-09-19 DIAGNOSIS — R05 Cough: Secondary | ICD-10-CM

## 2018-09-19 DIAGNOSIS — C3490 Malignant neoplasm of unspecified part of unspecified bronchus or lung: Secondary | ICD-10-CM

## 2018-09-19 DIAGNOSIS — M542 Cervicalgia: Secondary | ICD-10-CM

## 2018-09-19 DIAGNOSIS — C3491 Malignant neoplasm of unspecified part of right bronchus or lung: Secondary | ICD-10-CM

## 2018-09-19 LAB — COMPREHENSIVE METABOLIC PANEL
ALT: 19 U/L (ref 0–44)
AST: 19 U/L (ref 15–41)
Albumin: 4.1 g/dL (ref 3.5–5.0)
Alkaline Phosphatase: 103 U/L (ref 38–126)
Anion gap: 8 (ref 5–15)
BUN: 10 mg/dL (ref 8–23)
CO2: 23 mmol/L (ref 22–32)
Calcium: 8.7 mg/dL — ABNORMAL LOW (ref 8.9–10.3)
Chloride: 107 mmol/L (ref 98–111)
Creatinine, Ser: 0.35 mg/dL — ABNORMAL LOW (ref 0.44–1.00)
GFR calc Af Amer: >60 mL/min (ref >60)
GFR calc non Af Amer: >60 mL/min (ref >60)
Glucose, Bld: 124 mg/dL — ABNORMAL HIGH (ref 70–99)
Potassium: 3.8 mmol/L (ref 3.5–5.1)
Sodium: 138 mmol/L (ref 135–145)
Total Bilirubin: 0.2 mg/dL — ABNORMAL LOW (ref 0.3–1.2)
Total Protein: 8 g/dL (ref 6.5–8.1)

## 2018-09-19 LAB — CBC WITH DIFFERENTIAL/PLATELET
Abs Immature Granulocytes: 0.01 10*3/uL (ref 0.00–0.07)
Basophils Absolute: 0.1 10*3/uL (ref 0.0–0.1)
Basophils Relative: 1 %
Eosinophils Absolute: 0.5 10*3/uL (ref 0.0–0.5)
Eosinophils Relative: 8 %
HCT: 35 % — ABNORMAL LOW (ref 36.0–46.0)
Hemoglobin: 11.7 g/dL — ABNORMAL LOW (ref 12.0–15.0)
Immature Granulocytes: 0 %
Lymphocytes Relative: 25 %
Lymphs Abs: 1.5 10*3/uL (ref 0.7–4.0)
MCH: 33.5 pg (ref 26.0–34.0)
MCHC: 33.4 g/dL (ref 30.0–36.0)
MCV: 100.3 fL — ABNORMAL HIGH (ref 80.0–100.0)
Monocytes Absolute: 0.3 10*3/uL (ref 0.1–1.0)
Monocytes Relative: 6 %
Neutro Abs: 3.5 10*3/uL (ref 1.7–7.7)
Neutrophils Relative %: 60 %
Platelets: 287 10*3/uL (ref 150–400)
RBC: 3.49 MIL/uL — ABNORMAL LOW (ref 3.87–5.11)
RDW: 12.4 % (ref 11.5–15.5)
WBC: 5.8 10*3/uL (ref 4.0–10.5)
nRBC: 0 % (ref 0.0–0.2)

## 2018-09-19 LAB — TSH: TSH: 2.174 u[IU]/mL (ref 0.350–4.500)

## 2018-09-19 MED ORDER — SODIUM CHLORIDE 0.9% FLUSH
10.0000 mL | Freq: Once | INTRAVENOUS | Status: AC
  Administered 2018-09-19: 10 mL via INTRAVENOUS
  Filled 2018-09-19: qty 10

## 2018-09-19 MED ORDER — SODIUM CHLORIDE 0.9 % IV SOLN
Freq: Once | INTRAVENOUS | Status: AC
  Administered 2018-09-19: 11:00:00 via INTRAVENOUS
  Filled 2018-09-19: qty 250

## 2018-09-19 MED ORDER — SODIUM CHLORIDE 0.9 % IV SOLN
10.0000 mg/kg | Freq: Once | INTRAVENOUS | Status: AC
  Administered 2018-09-19: 860 mg via INTRAVENOUS
  Filled 2018-09-19: qty 10

## 2018-09-19 MED ORDER — HEPARIN SOD (PORK) LOCK FLUSH 100 UNIT/ML IV SOLN
500.0000 [IU] | Freq: Once | INTRAVENOUS | Status: AC | PRN
  Administered 2018-09-19: 500 [IU]
  Filled 2018-09-19: qty 5

## 2018-09-19 NOTE — Progress Notes (Signed)
Patient here for follow up. Complains of pain to left side of neck.

## 2018-09-20 NOTE — Progress Notes (Signed)
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   HISTORY OF PRESENTING ILLNESS:  Connie Powers is a  68 y.o.  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  CT lung cancer screen 05/27/2017   1. A few bilateral pulmonary nodules measuring up to 5 mm are indeterminate. Considering the suspicious 2.0 cm mediastinal adenopathy, follow up with PET/CT is recommended. 2. Mild bronchial wall thickening could represent airway disease including bronchitis.ACR Lung-RADS Category and Recommendation*: ACR Lung-RADS Category 2S (S modifier for mediastinal adenopathy)  PET scan 3/112019 1. No hypermetabolic pulmonary nodules.2. Hypermetabolic enlarged right paratracheal lymph node, of uncertain etiology in isolation. Lymphoproliferative disorder cannot be excluded. 3.  Aortic atherosclerosis (ICD10-170.0).  EBUS biopsy of paratracheal node showed non small cell lung cancer favoring adenocarcinoma. Case was discussed on tumor board on 09/16/2017 and consensus recommendation is to treat as Stage IIIA disease given the invasion of mediastinum.   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 MRI brain is contraindicated. CT brain negative for metastatic disease.  During the interval patient also was referred to vascular surgery for evaluation of malposition of Medi port.  Patient's right jugular port was removed and right internal jugular vein Mediport was placed by Dr.Dew on 8/5 2019.  # CT chest scan done 06/02/2018 which was independent reviewed by me. CT chest showed radiation changes  in the medial right upper lobe/paramediastinal region.  No evidence of recurrent or metastatic disease. Nonocclusive pericatheter thrombus/fibrin sheath along patient's right chest port. 06/06/2018 US venous upper right extremity ultrasound showed nonocclusive thrombus in the right jugular vein.  The port catheter can be seen associated with this thrombus.  The jugular vein is partially occlusive.  There is also partially occlusive thrombus in the right subclavian vein.  The right axillary, brachial, radial, and ulnar veins are compressible.  Doppler analysis demonstrated phasicity of the Doppler waveform. Patient was started on Eliquis for anticoagulation.   INTERVAL HISTORY Connie Powers is a 68 y.o. female who has above history reviewed by me today presents for assessment prior to maintenance immunotherapy for stage IIIa non-small cell lung cancer. Reports feeling well.  Tolerating immunotherapy well.  Denies any immunotherapy related side effects. Patient reports intermittent left neck pain.  Feels like pulling a muscle.  No exacerbating factors or alleviating factors. 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  Tolerates well.  Denies any bleeding problems. .She is not interested in having Mediport removed at this point.  Current Treatment S/p concurrent Chemotherapy [name] /taxol  Weekly x 6] and RT.  Started on maintenance durvalumab every 2 weeks on 12/27/2017. 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 She has stopped smoking.  Review of Systems  Constitutional: Negative for appetite change, chills, fatigue and fever.  HENT:   Negative for hearing loss and voice change.        Left side neck pain.  Intermittent.   Eyes: Negative for eye problems.  Respiratory: Positive for cough. Negative for chest tightness.   Cardiovascular: Negative for chest pain.  Gastrointestinal: Negative for  abdominal distention, abdominal pain and blood in stool.  Endocrine: Negative for hot flashes.  Genitourinary: Negative for difficulty urinating and frequency.   Musculoskeletal: Negative for arthralgias.  Skin: Negative for itching and rash.  Neurological: Negative for extremity weakness.  Hematological: Negative for adenopathy.  Psychiatric/Behavioral: Negative for confusion.    MEDICAL HISTORY:  Past Medical History:  Diagnosis Date  . Brain aneurysm   . Difficult intubation   . Hypertension   . Non-small cell cancer of right lung ([place]) 09/22/2017   Chemo + Rad tx's.   . Viral meningitis     SURGICAL HISTORY: Past Surgical History:  Procedure Laterality Date  . ENDOBRONCHIAL ULTRASOUND N/A 09/09/2017   Procedure: ENDOBRONCHIAL ULTRASOUND;  Surgeon: Laverle Hobby, MD;  Location: [facility];  Service: Pulmonary;  Laterality: N/A;  . OOPHORECTOMY Left   . PORTA CATH INSERTION N/A 09/29/2017   Procedure: PORTA CATH INSERTION;  Surgeon: Algernon Huxley, MD;  Location: [place] [facility];  Service: Cardiovascular;  Laterality: N/A;  . PORTA CATH INSERTION N/A 01/03/2018   Procedure: PORTA CATH INSERTION;  Surgeon: Algernon Huxley, MD;  Location: [place] [facility];  Service: Cardiovascular;  Laterality: N/A;  . surgical repair for brain tumor  1969   clips in head  . WISDOM TOOTH EXTRACTION      SOCIAL HISTORY: Social History   Socioeconomic History  . Marital status: Married    Spouse name: Not on file  . Number of children: Not on file  . Years of education: Not on file  . Highest education level: Not on file  Occupational History  . Not on file  Social Needs  . Financial resource strain: Not on file  . Food insecurity:    Worry: Not on file    Inability: Not on file  . Transportation needs:    Medical: Not on file    Non-medical: Not on file  Tobacco Use  . Smoking status: Former Smoker    Packs/day: 0.25    Last attempt to quit: 10/03/2017    Years since  quitting: 0.9  . Smokeless tobacco: Never Used  Substance and Sexual Activity  . Alcohol use: Not Currently    Comment: social  . Drug use: Not Currently    Types: Cocaine  . Sexual activity: Not on file  Lifestyle  . Physical activity:    Days per week: Not on file    Minutes per session: Not on file  . Stress: Not on file  Relationships  . Social connections:    Talks on phone: Not on file    Gets together: Not on file    Attends religious service: Not on file    Active member of club or organization: Not on file    Attends meetings of clubs or organizations: Not on file    Relationship status: Not on file  . Intimate partner violence:    Fear of current or ex partner: Not on file    Emotionally abused: Not on file    Physically abused: Not on file    Forced sexual activity: Not on file  Other Topics Concern  . Not on file  Social History Narrative  . Not on file    FAMILY HISTORY: Family History  Problem Relation Age of Onset  . Diabetes Mother   . Hypertension Mother   . Hyperlipidemia Mother   . Arthritis Mother   . Hypertension Father   . Hyperlipidemia Father   .  Heart attack Father        x2  . Breast cancer Neg Hx     ALLERGIES:  has No Known Allergies.  MEDICATIONS:  Current Outpatient Medications  Medication Sig Dispense Refill  . amLODipine (NORVASC) 10 MG tablet Take 10 mg by mouth daily.  11  . Cyanocobalamin (B-12) 1000 MCG CAPS Take 1,000 mcg by mouth daily. 30 capsule 3  . docusate sodium (COLACE) 100 MG capsule Take 100 mg by mouth daily.    Marland Kitchen ELIQUIS 5 MG TABS tablet Take 1 tablet by mouth twice daily 60 tablet 0  . lidocaine-prilocaine (EMLA) cream Apply to affected area once 30 g 3  . ondansetron (ZOFRAN) 8 MG tablet Take 1 tablet (8 mg total) by mouth 2 (two) times daily as needed for refractory nausea / vomiting. Start on day 3 after chemo. 30 tablet 1  . potassium chloride SA (K-DUR,KLOR-CON) 20 MEQ tablet Take 1 tablet (20 mEq total)  by mouth daily. 3 tablet 0  . prochlorperazine (COMPAZINE) 10 MG tablet Take 1 tablet (10 mg total) by mouth every 6 (six) hours as needed (Nausea or vomiting). 30 tablet 1  . umeclidinium-vilanterol (ANORO ELLIPTA) 62.5-25 MCG/INH AEPB Inhale 1 puff into the lungs daily as needed (shortness of breath).      No current facility-administered medications for this visit.      PHYSICAL EXAMINATION: ECOG PERFORMANCE STATUS: 0 - Asymptomatic Vitals:   09/19/18 0928  BP: 135/84  Pulse: 76  Resp: 18  Temp: (!) 96.3 F (35.7 C)  SpO2: 97%   Filed Weights   09/19/18 0928  Weight: 185 lb 14.4 oz (84.3 kg)    Physical Exam Constitutional:      General: She is not in acute distress. HENT:     Head: Normocephalic and atraumatic.  Eyes:     General: No scleral icterus.       Left eye: No discharge.     Conjunctiva/sclera: Conjunctivae normal.     Pupils: Pupils are equal, round, and reactive to light.  Neck:     Musculoskeletal: Normal range of motion and neck supple.     Comments: No neck swelling Cardiovascular:     Rate and Rhythm: Normal rate and regular rhythm.     Heart sounds: Normal heart sounds. No murmur.  Pulmonary:     Effort: Pulmonary effort is normal. No respiratory distress.     Breath sounds: Normal breath sounds. No wheezing or rales.  Chest:     Chest wall: No tenderness.  Abdominal:     General: Bowel sounds are normal. There is no distension.     Palpations: Abdomen is soft. There is no mass.     Tenderness: There is no abdominal tenderness. There is no guarding.  Musculoskeletal: Normal range of motion.        General: No deformity.  Lymphadenopathy:     Cervical: No cervical adenopathy.  Skin:    General: Skin is warm and dry.     Findings: No erythema or rash.     Comments: Right anterior chest wall + Mediport  Neurological:     Mental Status: She is alert and oriented to person, place, and time.     Cranial Nerves: No cranial nerve deficit.      Coordination: Coordination normal.  Psychiatric:        Behavior: Behavior normal.        Thought Content: Thought content normal.      LABORATORY DATA:  I have reviewed the data as listed Lab Results  Component Value Date   WBC 5.8 09/19/2018   HGB 11.7 (L) 09/19/2018   HCT 35.0 (L) 09/19/2018   MCV 100.3 (H) 09/19/2018   PLT 287 09/19/2018   Recent Labs    08/22/18 0858 09/05/18 0843 09/19/18 0903  NA 139 137 138  K 3.5 3.2* 3.8  CL 106 106 107  CO2 23 24 23   GLUCOSE 140* 124* 124*  BUN 10 7* 10  CREATININE 0.62 0.55 0.35*  CALCIUM 8.9 8.8* 8.7*  GFRNONAA >60 >60 >60  GFRAA >60 >60 >60  PROT 7.3 7.1 8.0  ALBUMIN 4.0 4.0 4.1  AST 16 16 19   ALT 16 18 19   ALKPHOS 100 104 103  BILITOT 0.4 0.5 0.2*   RADIOGRAPHIC STUDIES: I have personally reviewed the radiological images as listed and agreed with the findings in the report. 12/20/2017 Repeat CT scan showed interval decreased of disease. CT images were Independently reviewed by me and discussed with patient. New 42mm right lung lesion, indeterminate.  02/25/2018 CT chest with contrast, presumed evolutionary changes of radiation therapy in the upper right hemithorax.  Aortic atherosclerosis, emphysema.  ASSESSMENT & PLAN:  Cancer Staging Non-small cell lung cancer ([place]) Staging form: Lung, AJCC 8th Edition - Clinical stage from 09/22/2017: Stage Unknown (cTX, cN1, cM0) - Signed by Earlie Server, MD on 09/22/2017 - Pathologic: No stage assigned - Unsigned  1. Non-small cell cancer of right lung ([place])   2. Encounter for antineoplastic immunotherapy   3. Chronic anticoagulation   4. Macrocytosis without anemia    #Clinical stage IIIA lung NSCLC, favoring adenocarcinoma Patient tolerates maintenance immunotherapy durvalumab well. No clinical signs of immunotherapy related toxicities. Labs are reviewed and discussed with patient. Proceed with maintenance durvalumab treatment today. Plan total of 26 treatment. Recommend  proceed with CT chest without contrast prior to next visit.  #Chronic anticoagulation with Eliquis 5 mg twice daily for catheter related thrombosis. She tolerates anticoagulation well.  No bleeding events Continue. We have discussed that once she finishes her 26 treatment of immunotherapy, will remove Mediport.  #Left side neck pain, intermittent, this is a new problem for her.  No focal swelling.  Advised patient to use over-the-counter Tylenol.  If symptoms get worse she will update me.  RTC 2 weeks with repeat CBC and CMP. Orders Placed This Encounter  Procedures  . CT Chest Wo Contrast    Standing Status:   Future    Standing Expiration Date:   09/19/2019    Order Specific Question:   Preferred imaging location?    Answer:   [facility]    Order Specific Question:   Radiology Contrast Protocol - do NOT remove file path    Answer:   [email or web address]    Earlie Server, MD, PhD Hematology Oncology [facility] Pager- [phone] 09/20/2018

## 2018-09-23 MED ORDER — APIXABAN 5 MG PO TABS: 5.0000 mg | ORAL_TABLET | Freq: Two times a day (BID) | ORAL | 1 refills | Status: DC

## 2018-09-23 NOTE — Addendum Note (Signed)
Addended by: Earlie Server on: 09/23/2018 07:44 PM   Modules accepted: Orders

## 2018-09-26 ENCOUNTER — Other Ambulatory Visit: Payer: Self-pay | Admitting: *Deleted

## 2018-09-26 NOTE — Telephone Encounter (Signed)
Th was already filled 09/23/18

## 2018-09-29 ENCOUNTER — Other Ambulatory Visit: Payer: Self-pay

## 2018-09-29 ENCOUNTER — Ambulatory Visit
Admission: RE | Admit: 2018-09-29 | Discharge: 2018-09-29 | Disposition: A | Discharge status: Home / Self Care | Payer: Medicare HMO | Source: Ambulatory Visit | Attending: Oncology | Admitting: Oncology

## 2018-09-29 DIAGNOSIS — C3491 Malignant neoplasm of unspecified part of right bronchus or lung: Secondary | ICD-10-CM | POA: Diagnosis not present

## 2018-09-30 ENCOUNTER — Other Ambulatory Visit: Payer: Self-pay

## 2018-09-30 ENCOUNTER — Inpatient Hospital Stay: Payer: Medicare HMO | Admitting: Oncology

## 2018-09-30 ENCOUNTER — Encounter: Payer: Self-pay | Admitting: Oncology

## 2018-09-30 NOTE — Progress Notes (Signed)
Called patient for Telehealth visit via [employer].  Patient states no new concerns today.  

## 2018-10-02 ENCOUNTER — Other Ambulatory Visit: Payer: Self-pay

## 2018-10-03 ENCOUNTER — Inpatient Hospital Stay: Payer: Medicare HMO | Admitting: Oncology

## 2018-10-03 ENCOUNTER — Other Ambulatory Visit: Payer: Self-pay

## 2018-10-03 ENCOUNTER — Other Ambulatory Visit: Payer: Self-pay | Admitting: *Deleted

## 2018-10-03 ENCOUNTER — Inpatient Hospital Stay: Payer: Medicare HMO

## 2018-10-03 ENCOUNTER — Encounter: Payer: Self-pay | Admitting: Oncology

## 2018-10-03 ENCOUNTER — Inpatient Hospital Stay (HOSPITAL_BASED_OUTPATIENT_CLINIC_OR_DEPARTMENT_OTHER): Payer: Medicare HMO | Admitting: Oncology

## 2018-10-03 ENCOUNTER — Inpatient Hospital Stay: Payer: Medicare HMO | Attending: Oncology | Admitting: *Deleted

## 2018-10-03 VITALS — BP 138/83 | HR 83 | Temp 97.7°F | Wt 185.5 lb

## 2018-10-03 VITALS — Resp 17

## 2018-10-03 DIAGNOSIS — Z79899 Other long term (current) drug therapy: Secondary | ICD-10-CM | POA: Diagnosis not present

## 2018-10-03 DIAGNOSIS — C3491 Malignant neoplasm of unspecified part of right bronchus or lung: Secondary | ICD-10-CM | POA: Diagnosis not present

## 2018-10-03 DIAGNOSIS — I8290 Acute embolism and thrombosis of unspecified vein: Secondary | ICD-10-CM

## 2018-10-03 DIAGNOSIS — I82C11 Acute embolism and thrombosis of right internal jugular vein: Secondary | ICD-10-CM | POA: Diagnosis not present

## 2018-10-03 DIAGNOSIS — Z87891 Personal history of nicotine dependence: Secondary | ICD-10-CM

## 2018-10-03 DIAGNOSIS — Z5112 Encounter for antineoplastic immunotherapy: Secondary | ICD-10-CM | POA: Diagnosis not present

## 2018-10-03 DIAGNOSIS — D539 Nutritional anemia, unspecified: Secondary | ICD-10-CM

## 2018-10-03 DIAGNOSIS — D649 Anemia, unspecified: Secondary | ICD-10-CM

## 2018-10-03 DIAGNOSIS — I82B11 Acute embolism and thrombosis of right subclavian vein: Secondary | ICD-10-CM | POA: Diagnosis not present

## 2018-10-03 DIAGNOSIS — Z95828 Presence of other vascular implants and grafts: Secondary | ICD-10-CM

## 2018-10-03 DIAGNOSIS — C3490 Malignant neoplasm of unspecified part of unspecified bronchus or lung: Secondary | ICD-10-CM

## 2018-10-03 DIAGNOSIS — Z7901 Long term (current) use of anticoagulants: Secondary | ICD-10-CM

## 2018-10-03 LAB — CBC WITH DIFFERENTIAL/PLATELET
Abs Immature Granulocytes: 0.01 10*3/uL (ref 0.00–0.07)
Basophils Absolute: 0 10*3/uL (ref 0.0–0.1)
Basophils Relative: 1 %
Eosinophils Absolute: 0.4 10*3/uL (ref 0.0–0.5)
Eosinophils Relative: 7 %
HCT: 33.3 % — ABNORMAL LOW (ref 36.0–46.0)
Hemoglobin: 11.4 g/dL — ABNORMAL LOW (ref 12.0–15.0)
Immature Granulocytes: 0 %
Lymphocytes Relative: 23 %
Lymphs Abs: 1.4 10*3/uL (ref 0.7–4.0)
MCH: 34.3 pg — ABNORMAL HIGH (ref 26.0–34.0)
MCHC: 34.2 g/dL (ref 30.0–36.0)
MCV: 100.3 fL — ABNORMAL HIGH (ref 80.0–100.0)
Monocytes Absolute: 0.4 10*3/uL (ref 0.1–1.0)
Monocytes Relative: 6 %
Neutro Abs: 3.9 10*3/uL (ref 1.7–7.7)
Neutrophils Relative %: 63 %
Platelets: 287 10*3/uL (ref 150–400)
RBC: 3.32 MIL/uL — ABNORMAL LOW (ref 3.87–5.11)
RDW: 12.7 % (ref 11.5–15.5)
WBC: 6.1 10*3/uL (ref 4.0–10.5)
nRBC: 0 % (ref 0.0–0.2)

## 2018-10-03 LAB — COMPREHENSIVE METABOLIC PANEL
ALT: 18 U/L (ref 0–44)
AST: 16 U/L (ref 15–41)
Albumin: 3.9 g/dL (ref 3.5–5.0)
Alkaline Phosphatase: 97 U/L (ref 38–126)
Anion gap: 7 (ref 5–15)
BUN: 7 mg/dL — ABNORMAL LOW (ref 8–23)
CO2: 25 mmol/L (ref 22–32)
Calcium: 8.7 mg/dL — ABNORMAL LOW (ref 8.9–10.3)
Chloride: 106 mmol/L (ref 98–111)
Creatinine, Ser: 0.55 mg/dL (ref 0.44–1.00)
GFR calc Af Amer: >60 mL/min (ref >60)
GFR calc non Af Amer: >60 mL/min (ref >60)
Glucose, Bld: 123 mg/dL — ABNORMAL HIGH (ref 70–99)
Potassium: 3.1 mmol/L — ABNORMAL LOW (ref 3.5–5.1)
Sodium: 138 mmol/L (ref 135–145)
Total Bilirubin: 0.5 mg/dL (ref 0.3–1.2)
Total Protein: 6.9 g/dL (ref 6.5–8.1)

## 2018-10-03 LAB — RETIC PANEL
Immature Retic Fract: 11 % (ref 2.3–15.9)
RBC.: 3.32 MIL/uL — ABNORMAL LOW (ref 3.87–5.11)
Retic Count, Absolute: 86.3 10*3/uL (ref 19.0–186.0)
Retic Ct Pct: 2.6 % (ref 0.4–3.1)
Reticulocyte Hemoglobin: 38 pg (ref >27.9)

## 2018-10-03 LAB — FOLATE: Folate: 15 ng/mL (ref >5.9)

## 2018-10-03 LAB — VITAMIN B12: Vitamin B-12: 1341 pg/mL — ABNORMAL HIGH (ref 180–914)

## 2018-10-03 MED ORDER — HEPARIN SOD (PORK) LOCK FLUSH 100 UNIT/ML IV SOLN
500.0000 [IU] | Freq: Once | INTRAVENOUS | Status: AC | PRN
  Administered 2018-10-03: 500 [IU]
  Filled 2018-10-03: qty 5

## 2018-10-03 MED ORDER — SODIUM CHLORIDE 0.9% FLUSH
10.0000 mL | Freq: Once | INTRAVENOUS | Status: AC
  Administered 2018-10-03: 09:00:00 10 mL via INTRAVENOUS
  Filled 2018-10-03: qty 10

## 2018-10-03 MED ORDER — SODIUM CHLORIDE 0.9 % IV SOLN
10.0000 mg/kg | Freq: Once | INTRAVENOUS | Status: AC
  Administered 2018-10-03: 860 mg via INTRAVENOUS
  Filled 2018-10-03: qty 7.2

## 2018-10-03 MED ORDER — POTASSIUM CHLORIDE CRYS ER 20 MEQ PO TBCR: 20.0000 meq | EXTENDED_RELEASE_TABLET | Freq: Every day | ORAL | 0 refills | Status: DC

## 2018-10-03 MED ORDER — SODIUM CHLORIDE 0.9 % IV SOLN
Freq: Once | INTRAVENOUS | Status: AC
  Administered 2018-10-03: 10:00:00 via INTRAVENOUS
  Filled 2018-10-03: qty 250

## 2018-10-03 NOTE — Progress Notes (Signed)
Hematology/Oncology follow up note [facility] [phone] [phone]   Patient Care Team: Sharyne Peach, MD as PCP - General (Family Medicine) Earlie Server, MD as Medical Oncologist (Medical Oncology) Telford Nab, RN as Registered Nurse  REASON FOR VISIT Follow-up for evaluation prior to immunotherapy for stage IIIa non-small cell lung cancer    HISTORY OF PRESENTING ILLNESS:  Connie Powers is a  68 y.o.  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  CT lung cancer screen 05/27/2017   1. A few bilateral pulmonary nodules measuring up to 5 mm are indeterminate. Considering the suspicious 2.0 cm mediastinal adenopathy, follow up with PET/CT is recommended. 2. Mild bronchial wall thickening could represent airway disease including bronchitis.ACR Lung-RADS Category and Recommendation*: ACR Lung-RADS Category 2S (S modifier for mediastinal adenopathy)  PET scan 3/112019 1. No hypermetabolic pulmonary nodules.2. Hypermetabolic enlarged right paratracheal lymph node, of uncertain etiology in isolation. Lymphoproliferative disorder cannot be excluded. 3.  Aortic atherosclerosis (ICD10-170.0).  EBUS biopsy of paratracheal node showed non small cell lung cancer favoring adenocarcinoma. Case was discussed on tumor board on 09/16/2017 and consensus recommendation is to treat as Stage IIIA disease given the invasion of mediastinum.   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 MRI brain is contraindicated. CT brain negative for metastatic disease.  During the interval patient also was referred to vascular surgery for evaluation of malposition of Medi port.  Patient's right jugular port was removed and right internal jugular vein Mediport was placed by Dr.Dew on 8/5 2019.  # CT chest scan done 06/02/2018 which was independent reviewed by me. CT chest showed  radiation changes in the medial right upper lobe/paramediastinal region.  No evidence of recurrent or metastatic disease. Nonocclusive pericatheter thrombus/fibrin sheath along patient's right chest port. 06/06/2018 US venous upper right extremity ultrasound showed nonocclusive thrombus in the right jugular vein.  The port catheter can be seen associated with this thrombus.  The jugular vein is partially occlusive.  There is also partially occlusive thrombus in the right subclavian vein.  The right axillary, brachial, radial, and ulnar veins are compressible.  Doppler analysis demonstrated phasicity of the Doppler waveform. Patient was started on Eliquis for anticoagulation.   INTERVAL HISTORY Connie Powers is a 67 y.o. female who has above history reviewed by me today presents for assessment prior to Immunotherapy for stage IIIa non-small cell lung cancer. Patient reports doing well.  Denies any cough, shortness of breath, chest pain, diarrhea, skin rash. Clinically she tolerates immunotherapy well. She reports intermittent left neck pain has improved. 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  Tolerating anticoagulation.  Denies any bleeding problems. She is not interested in having Mediport removed at this point. We have had a discussed previously to discontinue Mediport after she finished 1 year of immunotherapy treatments.  Current Treatment S/p concurrent Chemotherapy [name] /taxol  Weekly x 6] and RT.  Started on maintenance durvalumab every 2 weeks on 12/27/2017. 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 She has stopped smoking.  Review of Systems  Constitutional: Negative for appetite change, chills, fatigue and fever.  HENT:   Negative for hearing loss and voice change.        Neck pain is better  Eyes: Negative for eye problems.  Respiratory: Positive for cough. Negative  for  chest tightness.   Cardiovascular: Negative for chest pain.  Gastrointestinal: Negative for abdominal distention, abdominal pain and blood in stool.  Endocrine: Negative for hot flashes.  Genitourinary: Negative for difficulty urinating and frequency.   Musculoskeletal: Negative for arthralgias.  Skin: Negative for itching and rash.  Neurological: Negative for extremity weakness.  Hematological: Negative for adenopathy.  Psychiatric/Behavioral: Negative for confusion.    MEDICAL HISTORY:  Past Medical History:  Diagnosis Date  . Brain aneurysm   . Difficult intubation   . Hypertension   . Non-small cell cancer of right lung ([place]) 09/22/2017   Chemo + Rad tx's.   . Viral meningitis     SURGICAL HISTORY: Past Surgical History:  Procedure Laterality Date  . ENDOBRONCHIAL ULTRASOUND N/A 09/09/2017   Procedure: ENDOBRONCHIAL ULTRASOUND;  Surgeon: Laverle Hobby, MD;  Location: [facility] ORS;  Service: Pulmonary;  Laterality: N/A;  . OOPHORECTOMY Left   . PORTA CATH INSERTION N/A 09/29/2017   Procedure: PORTA CATH INSERTION;  Surgeon: Algernon Huxley, MD;  Location: [facility];  Service: Cardiovascular;  Laterality: N/A;  . PORTA CATH INSERTION N/A 01/03/2018   Procedure: PORTA CATH INSERTION;  Surgeon: Algernon Huxley, MD;  Location: [facility] [facility];  Service: Cardiovascular;  Laterality: N/A;  . surgical repair for brain tumor  1969   clips in head  . WISDOM TOOTH EXTRACTION      SOCIAL HISTORY: Social History   Socioeconomic History  . Marital status: Married    Spouse name: Not on file  . Number of children: Not on file  . Years of education: Not on file  . Highest education level: Not on file  Occupational History  . Not on file  Social Needs  . Financial resource strain: Not on file  . Food insecurity:    Worry: Not on file    Inability: Not on file  . Transportation needs:    Medical: Not on file    Non-medical: Not on file  Tobacco Use  . Smoking  status: Former Smoker    Packs/day: 0.25    Last attempt to quit: 10/03/2017    Years since quitting: 1.0  . Smokeless tobacco: Never Used  Substance and Sexual Activity  . Alcohol use: Not Currently    Comment: social  . Drug use: Not Currently    Types: Cocaine  . Sexual activity: Not on file  Lifestyle  . Physical activity:    Days per week: Not on file    Minutes per session: Not on file  . Stress: Not on file  Relationships  . Social connections:    Talks on phone: Not on file    Gets together: Not on file    Attends religious service: Not on file    Active member of club or organization: Not on file    Attends meetings of clubs or organizations: Not on file    Relationship status: Not on file  . Intimate partner violence:    Fear of current or ex partner: Not on file    Emotionally abused: Not on file    Physically abused: Not on file    Forced sexual activity: Not on file  Other Topics Concern  . Not on file  Social History Narrative  . Not on file    FAMILY HISTORY: Family History  Problem Relation Age of Onset  . Diabetes Mother   . Hypertension Mother   . Hyperlipidemia Mother   . Arthritis Mother   .  Hypertension Father   . Hyperlipidemia Father   . Heart attack Father        x2  . Breast cancer Neg Hx     ALLERGIES:  has No Known Allergies.  MEDICATIONS:  Current Outpatient Medications  Medication Sig Dispense Refill  . amLODipine (NORVASC) 10 MG tablet Take 10 mg by mouth daily.  11  . apixaban (ELIQUIS) 5 MG TABS tablet Take 1 tablet (5 mg total) by mouth 2 (two) times daily. 60 tablet 1  . Cyanocobalamin (B-12) 1000 MCG CAPS Take 1,000 mcg by mouth daily. 30 capsule 3  . docusate sodium (COLACE) 100 MG capsule Take 100 mg by mouth daily.    Marland Kitchen lidocaine-prilocaine (EMLA) cream Apply to affected area once 30 g 3  . ondansetron (ZOFRAN) 8 MG tablet Take 1 tablet (8 mg total) by mouth 2 (two) times daily as needed for refractory nausea / vomiting.  Start on day 3 after chemo. 30 tablet 1  . potassium chloride SA (K-DUR,KLOR-CON) 20 MEQ tablet Take 1 tablet (20 mEq total) by mouth daily. 3 tablet 0  . prochlorperazine (COMPAZINE) 10 MG tablet Take 1 tablet (10 mg total) by mouth every 6 (six) hours as needed (Nausea or vomiting). 30 tablet 1  . umeclidinium-vilanterol (ANORO ELLIPTA) 62.5-25 MCG/INH AEPB Inhale 1 puff into the lungs daily as needed (shortness of breath).      No current facility-administered medications for this visit.      PHYSICAL EXAMINATION: ECOG PERFORMANCE STATUS: 0 - Asymptomatic Vitals:   10/03/18 0930  BP: 138/83  Pulse: 83  Temp: 97.7 F (36.5 C)   Filed Weights   10/03/18 0930  Weight: 185 lb 8 oz (84.1 kg)    Physical Exam Constitutional:      General: She is not in acute distress. HENT:     Head: Normocephalic and atraumatic.  Eyes:     General: No scleral icterus.       Left eye: No discharge.     Conjunctiva/sclera: Conjunctivae normal.     Pupils: Pupils are equal, round, and reactive to light.  Neck:     Musculoskeletal: Normal range of motion and neck supple.     Comments: No neck swelling Cardiovascular:     Rate and Rhythm: Normal rate and regular rhythm.     Heart sounds: Normal heart sounds. No murmur.  Pulmonary:     Effort: Pulmonary effort is normal. No respiratory distress.     Breath sounds: Normal breath sounds. No wheezing or rales.  Chest:     Chest wall: No tenderness.  Abdominal:     General: Bowel sounds are normal. There is no distension.     Palpations: Abdomen is soft. There is no mass.     Tenderness: There is no abdominal tenderness. There is no guarding.  Musculoskeletal: Normal range of motion.        General: No deformity.  Lymphadenopathy:     Cervical: No cervical adenopathy.  Skin:    General: Skin is warm and dry.     Findings: No erythema or rash.     Comments: Right anterior chest wall + Mediport, no surrounding swelling or erythema.   Neurological:     Mental Status: She is alert and oriented to person, place, and time.     Cranial Nerves: No cranial nerve deficit.     Coordination: Coordination normal.  Psychiatric:        Behavior: Behavior normal.  Thought Content: Thought content normal.      LABORATORY DATA:  I have reviewed the data as listed Lab Results  Component Value Date   WBC 5.8 09/19/2018   HGB 11.7 (L) 09/19/2018   HCT 35.0 (L) 09/19/2018   MCV 100.3 (H) 09/19/2018   PLT 287 09/19/2018   Recent Labs    08/22/18 0858 09/05/18 0843 09/19/18 0903  NA 139 137 138  K 3.5 3.2* 3.8  CL 106 106 107  CO2 23 24 23   GLUCOSE 140* 124* 124*  BUN 10 7* 10  CREATININE 0.62 0.55 0.35*  CALCIUM 8.9 8.8* 8.7*  GFRNONAA >60 >60 >60  GFRAA >60 >60 >60  PROT 7.3 7.1 8.0  ALBUMIN 4.0 4.0 4.1  AST 16 16 19   ALT 16 18 19   ALKPHOS 100 104 103  BILITOT 0.4 0.5 0.2*   RADIOGRAPHIC STUDIES: I have personally reviewed the radiological images as listed and agreed with the findings in the report. 12/20/2017 Repeat CT scan showed interval decreased of disease. CT images were Independently reviewed by me and discussed with patient. New 31mm right lung lesion, indeterminate.  02/25/2018 CT chest with contrast, presumed evolutionary changes of radiation therapy in the upper right hemithorax.  Aortic atherosclerosis, emphysema.  ASSESSMENT & PLAN:  Cancer Staging Non-small cell lung cancer ([place]) Staging form: Lung, AJCC 8th Edition - Clinical stage from 09/22/2017: Stage Unknown (cTX, cN1, cM0) - Signed by Earlie Server, MD on 09/22/2017 - Pathologic: No stage assigned - Unsigned  1. Non-small cell cancer of right lung ([place])   2. Port-A-Cath in place   3. Thrombosis   4. Chronic anticoagulation   5. Encounter for antineoplastic immunotherapy   6. Macrocytic anemia    #Clinical stage IIIA lung NSCLC, favoring adenocarcinoma Patient tolerates immunotherapy treatment durvalumab well. No clinical signs of  immunotherapy related toxicities. Labs are reviewed and discussed with patient. Stable disease. CT chest without on 09/29/2018 was independent reviewed by me and discussed with patient. She continues to have evolution of post radiation change in the upper perihilar/paramediastinal right lung.  No findings suspicious for metastatic disease in the chest.  No recurrent right mediastinal adenopathy.  Left lower lobe 6 mm solid pulmonary nodule is stable since 05/27/2017 on the CT scan.  Presumably benign.  Continue attention on follow-up CT.  Proceed with durvalumab treatment today. Plan total of 26 treatment  #Catheter provoked right IJ thrombosis, continue Eliquis 5 mg twice daily tolerating well. Plan to discontinue Mediport after she finishes 1 year immunotherapy and after that will consider stopping anticoagulation. #Macrocytic anemia, MCV 100.3.  I obtained vitamin B12 and folate levels both are not decreased.  Continue to monitor.  RTC 2 weeks with repeat CBC and CMP.  Earlie Server, MD, PhD Hematology Oncology [facility], [place] Pager- [phone] 10/03/2018

## 2018-10-03 NOTE — Progress Notes (Signed)
Patient here today for follow up and chemotherapy.  Patient states no new concerns today

## 2018-10-14 ENCOUNTER — Telehealth: Payer: Self-pay | Admitting: Oncology

## 2018-10-17 ENCOUNTER — Inpatient Hospital Stay (HOSPITAL_BASED_OUTPATIENT_CLINIC_OR_DEPARTMENT_OTHER): Payer: Medicare HMO | Admitting: Oncology

## 2018-10-17 ENCOUNTER — Inpatient Hospital Stay: Payer: Medicare HMO

## 2018-10-17 ENCOUNTER — Other Ambulatory Visit: Payer: Self-pay

## 2018-10-17 ENCOUNTER — Encounter: Payer: Self-pay | Admitting: Oncology

## 2018-10-17 VITALS — BP 129/75 | HR 75 | Temp 97.4°F | Resp 20 | Wt 188.4 lb

## 2018-10-17 DIAGNOSIS — R059 Cough, unspecified: Secondary | ICD-10-CM

## 2018-10-17 DIAGNOSIS — I8290 Acute embolism and thrombosis of unspecified vein: Secondary | ICD-10-CM

## 2018-10-17 DIAGNOSIS — T82868S Thrombosis of vascular prosthetic devices, implants and grafts, sequela: Secondary | ICD-10-CM | POA: Diagnosis not present

## 2018-10-17 DIAGNOSIS — C3491 Malignant neoplasm of unspecified part of right bronchus or lung: Secondary | ICD-10-CM

## 2018-10-17 DIAGNOSIS — R05 Cough: Secondary | ICD-10-CM

## 2018-10-17 DIAGNOSIS — Z5112 Encounter for antineoplastic immunotherapy: Secondary | ICD-10-CM

## 2018-10-17 DIAGNOSIS — Z7901 Long term (current) use of anticoagulants: Secondary | ICD-10-CM

## 2018-10-17 DIAGNOSIS — Z95828 Presence of other vascular implants and grafts: Secondary | ICD-10-CM

## 2018-10-17 DIAGNOSIS — D539 Nutritional anemia, unspecified: Secondary | ICD-10-CM

## 2018-10-17 LAB — CBC WITH DIFFERENTIAL/PLATELET
Abs Immature Granulocytes: 0.01 10*3/uL (ref 0.00–0.07)
Basophils Absolute: 0.1 10*3/uL (ref 0.0–0.1)
Basophils Relative: 1 %
Eosinophils Absolute: 0.7 10*3/uL — ABNORMAL HIGH (ref 0.0–0.5)
Eosinophils Relative: 14 %
HCT: 34.2 % — ABNORMAL LOW (ref 36.0–46.0)
Hemoglobin: 11.5 g/dL — ABNORMAL LOW (ref 12.0–15.0)
Immature Granulocytes: 0 %
Lymphocytes Relative: 23 %
Lymphs Abs: 1.2 10*3/uL (ref 0.7–4.0)
MCH: 34.2 pg — ABNORMAL HIGH (ref 26.0–34.0)
MCHC: 33.6 g/dL (ref 30.0–36.0)
MCV: 101.8 fL — ABNORMAL HIGH (ref 80.0–100.0)
Monocytes Absolute: 0.4 10*3/uL (ref 0.1–1.0)
Monocytes Relative: 8 %
Neutro Abs: 2.8 10*3/uL (ref 1.7–7.7)
Neutrophils Relative %: 54 %
Platelets: 296 10*3/uL (ref 150–400)
RBC: 3.36 MIL/uL — ABNORMAL LOW (ref 3.87–5.11)
RDW: 12.9 % (ref 11.5–15.5)
WBC: 5.3 10*3/uL (ref 4.0–10.5)
nRBC: 0 % (ref 0.0–0.2)

## 2018-10-17 LAB — COMPREHENSIVE METABOLIC PANEL
ALT: 44 U/L (ref 0–44)
AST: 28 U/L (ref 15–41)
Albumin: 3.9 g/dL (ref 3.5–5.0)
Alkaline Phosphatase: 120 U/L (ref 38–126)
Anion gap: 5 (ref 5–15)
BUN: 8 mg/dL (ref 8–23)
CO2: 26 mmol/L (ref 22–32)
Calcium: 8.6 mg/dL — ABNORMAL LOW (ref 8.9–10.3)
Chloride: 107 mmol/L (ref 98–111)
Creatinine, Ser: 0.57 mg/dL (ref 0.44–1.00)
GFR calc Af Amer: >60 mL/min (ref >60)
GFR calc non Af Amer: >60 mL/min (ref >60)
Glucose, Bld: 126 mg/dL — ABNORMAL HIGH (ref 70–99)
Potassium: 3.7 mmol/L (ref 3.5–5.1)
Sodium: 138 mmol/L (ref 135–145)
Total Bilirubin: 0.7 mg/dL (ref 0.3–1.2)
Total Protein: 7.1 g/dL (ref 6.5–8.1)

## 2018-10-17 LAB — TSH: TSH: 2.215 u[IU]/mL (ref 0.350–4.500)

## 2018-10-17 MED ORDER — HEPARIN SOD (PORK) LOCK FLUSH 100 UNIT/ML IV SOLN
500.0000 [IU] | Freq: Once | INTRAVENOUS | Status: AC
  Administered 2018-10-17: 13:00:00 500 [IU] via INTRAVENOUS
  Filled 2018-10-17: qty 5

## 2018-10-17 MED ORDER — SODIUM CHLORIDE 0.9 % IV SOLN
Freq: Once | INTRAVENOUS | Status: AC
  Administered 2018-10-17: 11:00:00 via INTRAVENOUS
  Filled 2018-10-17: qty 250

## 2018-10-17 MED ORDER — SODIUM CHLORIDE 0.9 % IV SOLN
10.0000 mg/kg | Freq: Once | INTRAVENOUS | Status: AC
  Administered 2018-10-17: 860 mg via INTRAVENOUS
  Filled 2018-10-17: qty 7.2

## 2018-10-17 MED ORDER — SODIUM CHLORIDE 0.9% FLUSH
10.0000 mL | INTRAVENOUS | Status: DC | PRN
  Administered 2018-10-17: 10 mL via INTRAVENOUS
  Filled 2018-10-17: qty 10

## 2018-10-17 NOTE — Progress Notes (Signed)
HEMATOLOGY-ONCOLOGY TeleHEALTH VISIT PROGRESS NOTE  I connected with Connie Powers on 10/17/18 at  8:00 AM EDT by video enabled telemedicine visit and verified that I am speaking with the correct person using two identifiers. I discussed the limitations, risks, security and privacy concerns of performing an evaluation and management service by telemedicine and the availability of in-person appointments. I also discussed with the patient that there may be a patient responsible charge related to this service. The patient expressed understanding and agreed to proceed.   Other persons participating in the visit and their role in the encounter:  Geraldine Solar, [place], check in patient   Janeann Merl, RN, check in patient.   Patient's location: Home  Provider's location: Home office Chief Complaint: Follow-up for evaluation prior to immunotherapy for treatment of lung cancer.   INTERVAL HISTORY Connie Powers is a 68 y.o. female who has above history reviewed by me today presents for follow up visit for management of stage IIIa lung cancer, evaluation prior to immunotherapy. Problems and complaints are listed below:  Patient reports doing well at baseline today.  She appears tolerating immunotherapy quite well. Denies any skin rash, diarrhea, shortness of breath more than usual, chest pain, abdominal pain She has no concern with her right anterior chest wall Mediport, no swelling or pain. 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 Plan is to continue coagulation until patient finishes all her adjuvant chemotherapy/immunotherapy treatments and the Mediport can be discontinued at that point. She tolerates anticoagulation well.Denies hematochezia, hematuria, hematemesis, epistaxis, black tarry stool or easy bruising.    Review of Systems  Constitutional: Negative for appetite change, chills, fatigue and fever.   HENT:   Negative for hearing loss and voice change.   Eyes: Negative for eye problems.  Respiratory: Negative for chest tightness and cough.   Cardiovascular: Negative for chest pain.  Gastrointestinal: Negative for abdominal distention, abdominal pain and blood in stool.  Endocrine: Negative for hot flashes.  Genitourinary: Negative for difficulty urinating and frequency.   Musculoskeletal: Negative for arthralgias.  Skin: Negative for itching and rash.  Neurological: Negative for extremity weakness.  Hematological: Negative for adenopathy.  Psychiatric/Behavioral: Negative for confusion.    Past Medical History:  Diagnosis Date  . Brain aneurysm   . Difficult intubation   . Hypertension   . Non-small cell cancer of right lung ([place]) 09/22/2017   Chemo + Rad tx's.   . Viral meningitis    Past Surgical History:  Procedure Laterality Date  . ENDOBRONCHIAL ULTRASOUND N/A 09/09/2017   Procedure: ENDOBRONCHIAL ULTRASOUND;  Surgeon: Laverle Hobby, MD;  Location: [facility];  Service: Pulmonary;  Laterality: N/A;  . OOPHORECTOMY Left   . PORTA CATH INSERTION N/A 09/29/2017   Procedure: PORTA CATH INSERTION;  Surgeon: Algernon Huxley, MD;  Location: [place] [facility];  Service: Cardiovascular;  Laterality: N/A;  . PORTA CATH INSERTION N/A 01/03/2018   Procedure: PORTA CATH INSERTION;  Surgeon: Algernon Huxley, MD;  Location: [place] [facility];  Service: Cardiovascular;  Laterality: N/A;  . surgical repair for brain tumor  1969   clips in head  . WISDOM TOOTH EXTRACTION      Family History  Problem Relation Age of Onset  . Diabetes Mother   . Hypertension Mother   . Hyperlipidemia Mother   . Arthritis Mother   . Hypertension Father   . Hyperlipidemia Father   . Heart attack Father  x2  . Breast cancer Neg Hx     Social History   Socioeconomic History  . Marital status: Married    Spouse name: Not on file  . Number of children: Not on file  . Years of education: Not  on file  . Highest education level: Not on file  Occupational History  . Not on file  Social Needs  . Financial resource strain: Not on file  . Food insecurity:    Worry: Not on file    Inability: Not on file  . Transportation needs:    Medical: Not on file    Non-medical: Not on file  Tobacco Use  . Smoking status: Former Smoker    Packs/day: 0.25    Last attempt to quit: 10/03/2017    Years since quitting: 1.0  . Smokeless tobacco: Never Used  Substance and Sexual Activity  . Alcohol use: Not Currently    Comment: social  . Drug use: Not Currently    Types: Cocaine  . Sexual activity: Not on file  Lifestyle  . Physical activity:    Days per week: Not on file    Minutes per session: Not on file  . Stress: Not on file  Relationships  . Social connections:    Talks on phone: Not on file    Gets together: Not on file    Attends religious service: Not on file    Active member of club or organization: Not on file    Attends meetings of clubs or organizations: Not on file    Relationship status: Not on file  . Intimate partner violence:    Fear of current or ex partner: Not on file    Emotionally abused: Not on file    Physically abused: Not on file    Forced sexual activity: Not on file  Other Topics Concern  . Not on file  Social History Narrative  . Not on file    Current Outpatient Medications on File Prior to Visit  Medication Sig Dispense Refill  . amLODipine (NORVASC) 10 MG tablet Take 10 mg by mouth daily.  11  . apixaban (ELIQUIS) 5 MG TABS tablet Take 1 tablet (5 mg total) by mouth 2 (two) times daily. 60 tablet 1  . Cyanocobalamin (B-12) 1000 MCG CAPS Take 1,000 mcg by mouth daily. 30 capsule 3  . docusate sodium (COLACE) 100 MG capsule Take 100 mg by mouth daily.    Marland Kitchen lidocaine-prilocaine (EMLA) cream Apply to affected area once 30 g 3  . ondansetron (ZOFRAN) 8 MG tablet Take 1 tablet (8 mg total) by mouth 2 (two) times daily as needed for refractory nausea  / vomiting. Start on day 3 after chemo. 30 tablet 1  . potassium chloride SA (K-DUR) 20 MEQ tablet Take 1 tablet (20 mEq total) by mouth daily. 3 tablet 0  . prochlorperazine (COMPAZINE) 10 MG tablet Take 1 tablet (10 mg total) by mouth every 6 (six) hours as needed (Nausea or vomiting). 30 tablet 1  . umeclidinium-vilanterol (ANORO ELLIPTA) 62.5-25 MCG/INH AEPB Inhale 1 puff into the lungs daily as needed (shortness of breath).      Current Facility-Administered Medications on File Prior to Visit  Medication Dose Route Frequency Provider Last Rate Last Dose  . sodium chloride flush (NS) 0.9 % injection 10 mL  10 mL Intravenous PRN Earlie Server, MD   10 mL at 10/17/18 1011    No Known Allergies     Observations/Objective: There were no vitals  filed for this visit. There is no height or weight on file to calculate BMI.  Pain level 0 Physical Exam  Constitutional: She is oriented to person, place, and time. No distress.  HENT:  Head: Normocephalic and atraumatic.  Pulmonary/Chest: Effort normal.  Neurological: She is alert and oriented to person, place, and time.  Psychiatric: Affect normal.    CBC    Component Value Date/Time   WBC 5.3 10/17/2018 1011   RBC 3.36 (L) 10/17/2018 1011   HGB 11.5 (L) 10/17/2018 1011   HGB 12.3 02/04/2013 1657   HCT 34.2 (L) 10/17/2018 1011   HCT 35.9 02/04/2013 1657   PLT 296 10/17/2018 1011   PLT 276 02/04/2013 1657   MCV 101.8 (H) 10/17/2018 1011   MCV 100 02/04/2013 1657   MCH 34.2 (H) 10/17/2018 1011   MCHC 33.6 10/17/2018 1011   RDW 12.9 10/17/2018 1011   RDW 12.6 02/04/2013 1657   LYMPHSABS 1.2 10/17/2018 1011   MONOABS 0.4 10/17/2018 1011   EOSABS 0.7 (H) 10/17/2018 1011   BASOSABS 0.1 10/17/2018 1011    CMP     Component Value Date/Time   NA 138 10/17/2018 1011   NA 138 02/04/2013 1657   K 3.7 10/17/2018 1011   K 3.6 02/04/2013 1657   CL 107 10/17/2018 1011   CL 107 02/04/2013 1657   CO2 26 10/17/2018 1011   CO2 27 02/04/2013  1657   GLUCOSE 126 (H) 10/17/2018 1011   GLUCOSE 103 (H) 02/04/2013 1657   BUN 8 10/17/2018 1011   BUN 12 02/04/2013 1657   CREATININE 0.57 10/17/2018 1011   CREATININE 0.82 02/04/2013 1657   CALCIUM 8.6 (L) 10/17/2018 1011   CALCIUM 8.9 02/04/2013 1657   PROT 7.1 10/17/2018 1011   PROT 7.3 02/04/2013 1657   ALBUMIN 3.9 10/17/2018 1011   ALBUMIN 3.8 02/04/2013 1657   AST 28 10/17/2018 1011   AST 28 02/04/2013 1657   ALT 44 10/17/2018 1011   ALT 36 02/04/2013 1657   ALKPHOS 120 10/17/2018 1011   ALKPHOS 101 02/04/2013 1657   BILITOT 0.7 10/17/2018 1011   BILITOT 0.3 02/04/2013 1657   GFRNONAA >60 10/17/2018 1011   GFRNONAA >60 02/04/2013 1657   GFRAA >60 10/17/2018 1011   GFRAA >60 02/04/2013 1657     Assessment and Plan: 1. Non-small cell cancer of right lung ([place])   2. Encounter for antineoplastic immunotherapy   3. Port-A-Cath in place   4. Thrombosis   5. Macrocytic anemia     Labs are reviewed and discussed with patient. #Non-small cell lung cancer stage IIIA, currently on durvalumab every 2 weeks.  Tolerating well. Patient will proceed with durvalumab today. Continue CT chest surveillance every 3 months last CT was done on 09/29/2018.  Due in July 2020.  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  Patient prefers to discontinue Mediport after she finish all her adjuvant treatments.  #Macrocytic anemia, adequate B12 and folate level.  No signs of hemolysis.  Reticulocyte panel showed no increased reticulocyte count.  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  Continue monitor closely   Follow Up Instructions: 2 weeks [facility] Durvalumab.    I discussed the assessment and treatment plan with the patient. The patient was provided an opportunity to ask questions and all were answered. The patient agreed with the plan and demonstrated an understanding of the instructions.  The patient was advised to call  back or seek an in-person  evaluation if the symptoms worsen or if the condition fails to improve as anticipated.    Earlie Server, MD 10/17/2018 2:20 PM

## 2018-10-20 ENCOUNTER — Ambulatory Visit: Payer: Medicare HMO | Admitting: Oncology

## 2018-10-27 ENCOUNTER — Telehealth: Payer: Self-pay | Admitting: Oncology

## 2018-10-28 ENCOUNTER — Inpatient Hospital Stay (HOSPITAL_BASED_OUTPATIENT_CLINIC_OR_DEPARTMENT_OTHER): Payer: Medicare HMO | Admitting: Oncology

## 2018-10-28 ENCOUNTER — Inpatient Hospital Stay: Payer: Medicare HMO

## 2018-10-28 ENCOUNTER — Encounter: Payer: Self-pay | Admitting: Oncology

## 2018-10-28 ENCOUNTER — Other Ambulatory Visit: Payer: Self-pay

## 2018-10-28 VITALS — BP 133/76 | HR 86 | Temp 97.9°F | Resp 18 | Wt 185.0 lb

## 2018-10-28 DIAGNOSIS — Z87891 Personal history of nicotine dependence: Secondary | ICD-10-CM

## 2018-10-28 DIAGNOSIS — I82C11 Acute embolism and thrombosis of right internal jugular vein: Secondary | ICD-10-CM | POA: Diagnosis not present

## 2018-10-28 DIAGNOSIS — D539 Nutritional anemia, unspecified: Secondary | ICD-10-CM | POA: Diagnosis not present

## 2018-10-28 DIAGNOSIS — Z5112 Encounter for antineoplastic immunotherapy: Secondary | ICD-10-CM | POA: Diagnosis not present

## 2018-10-28 DIAGNOSIS — I8290 Acute embolism and thrombosis of unspecified vein: Secondary | ICD-10-CM

## 2018-10-28 DIAGNOSIS — I82B11 Acute embolism and thrombosis of right subclavian vein: Secondary | ICD-10-CM

## 2018-10-28 DIAGNOSIS — Z95828 Presence of other vascular implants and grafts: Secondary | ICD-10-CM

## 2018-10-28 DIAGNOSIS — C3491 Malignant neoplasm of unspecified part of right bronchus or lung: Secondary | ICD-10-CM

## 2018-10-28 DIAGNOSIS — Z7901 Long term (current) use of anticoagulants: Secondary | ICD-10-CM

## 2018-10-28 LAB — CBC WITH DIFFERENTIAL/PLATELET
Abs Immature Granulocytes: 0.01 10*3/uL (ref 0.00–0.07)
Basophils Absolute: 0.1 10*3/uL (ref 0.0–0.1)
Basophils Relative: 1 %
Eosinophils Absolute: 0.7 10*3/uL — ABNORMAL HIGH (ref 0.0–0.5)
Eosinophils Relative: 13 %
HCT: 35.4 % — ABNORMAL LOW (ref 36.0–46.0)
Hemoglobin: 11.7 g/dL — ABNORMAL LOW (ref 12.0–15.0)
Immature Granulocytes: 0 %
Lymphocytes Relative: 22 %
Lymphs Abs: 1.2 10*3/uL (ref 0.7–4.0)
MCH: 33.9 pg (ref 26.0–34.0)
MCHC: 33.1 g/dL (ref 30.0–36.0)
MCV: 102.6 fL — ABNORMAL HIGH (ref 80.0–100.0)
Monocytes Absolute: 0.4 10*3/uL (ref 0.1–1.0)
Monocytes Relative: 8 %
Neutro Abs: 3.1 10*3/uL (ref 1.7–7.7)
Neutrophils Relative %: 56 %
Platelets: 317 10*3/uL (ref 150–400)
RBC: 3.45 MIL/uL — ABNORMAL LOW (ref 3.87–5.11)
RDW: 12.7 % (ref 11.5–15.5)
WBC: 5.5 10*3/uL (ref 4.0–10.5)
nRBC: 0 % (ref 0.0–0.2)

## 2018-10-28 LAB — COMPREHENSIVE METABOLIC PANEL
ALT: 20 U/L (ref 0–44)
AST: 19 U/L (ref 15–41)
Albumin: 4.1 g/dL (ref 3.5–5.0)
Alkaline Phosphatase: 104 U/L (ref 38–126)
Anion gap: 8 (ref 5–15)
BUN: 6 mg/dL — ABNORMAL LOW (ref 8–23)
CO2: 26 mmol/L (ref 22–32)
Calcium: 9.1 mg/dL (ref 8.9–10.3)
Chloride: 104 mmol/L (ref 98–111)
Creatinine, Ser: 0.61 mg/dL (ref 0.44–1.00)
GFR calc Af Amer: >60 mL/min (ref >60)
GFR calc non Af Amer: >60 mL/min (ref >60)
Glucose, Bld: 139 mg/dL — ABNORMAL HIGH (ref 70–99)
Potassium: 3.7 mmol/L (ref 3.5–5.1)
Sodium: 138 mmol/L (ref 135–145)
Total Bilirubin: 0.4 mg/dL (ref 0.3–1.2)
Total Protein: 7.4 g/dL (ref 6.5–8.1)

## 2018-10-28 NOTE — Progress Notes (Signed)
Patient contacted via phone for telehealth visit. No concerns voiced.

## 2018-10-29 NOTE — Progress Notes (Addendum)
Hematology/Oncology follow up note [facility] [phone] Fax:([phone]   Patient Care Team: Sharyne Peach, MD as PCP - General (Family Medicine) Earlie Server, MD as Medical Oncologist (Medical Oncology) Telford Nab, RN as Registered Nurse  REASON FOR VISIT Follow-up for evaluation prior to immunotherapy for stage IIIA non-small cell lung cancer-adenocarcinoma    HISTORY OF PRESENTING ILLNESS:  Connie Powers is a  68 y.o.  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  CT lung cancer screen 05/27/2017   1. A few bilateral pulmonary nodules measuring up to 5 mm are indeterminate. Considering the suspicious 2.0 cm mediastinal adenopathy, follow up with PET/CT is recommended. 2. Mild bronchial wall thickening could represent airway disease including bronchitis.ACR Lung-RADS Category and Recommendation*: ACR Lung-RADS Category 2S (S modifier for mediastinal adenopathy)  PET scan 3/112019 1. No hypermetabolic pulmonary nodules.2. Hypermetabolic enlarged right paratracheal lymph node, of uncertain etiology in isolation. Lymphoproliferative disorder cannot be excluded. 3.  Aortic atherosclerosis (ICD10-170.0).  EBUS biopsy of paratracheal node showed non small cell lung cancer favoring adenocarcinoma. Case was discussed on tumor board on 09/16/2017 and consensus recommendation is to treat as Stage IIIA disease given the invasion of mediastinum.   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 MRI brain is contraindicated. CT brain negative for metastatic disease.  During the interval patient also was referred to vascular surgery for evaluation of malposition of Medi port.  Patient's right jugular port was removed and right internal jugular vein Mediport was placed by Dr.Dew on 8/5 2019.  # CT chest scan done 06/02/2018 which was independent reviewed by me. CT  chest showed radiation changes in the medial right upper lobe/paramediastinal region.  No evidence of recurrent or metastatic disease. Nonocclusive pericatheter thrombus/fibrin sheath along patient's right chest port. 06/06/2018 US venous upper right extremity ultrasound showed nonocclusive thrombus in the right jugular vein.  The port catheter can be seen associated with this thrombus.  The jugular vein is partially occlusive.  There is also partially occlusive thrombus in the right subclavian vein.  The right axillary, brachial, radial, and ulnar veins are compressible.  Doppler analysis demonstrated phasicity of the Doppler waveform. Patient was started on Eliquis for anticoagulation.   INTERVAL HISTORY Connie Powers is a 68 y.o. female who has above history reviewed by me today presents for assessment prior to immunotherapy for stage III A non-small cell lung cancer-adenocarcinoma Patient reports doing well.  Denies any  shortness of breath, chest pain, diarrhea or skin rash.  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  Tolerating well.  No bleeding events. No pain or swelling around her Mediport.  Current Treatment S/p concurrent Chemotherapy [name] /taxol  Weekly x 6] and RT.  Started on maintenance durvalumab every 2 weeks on 12/27/2017. 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 She has stopped smoking.  Review of Systems  Constitutional: Negative for appetite change, chills, fatigue and fever.  HENT:   Negative for hearing loss and voice change.        Neck pain is better  Eyes: Negative for eye problems.  Respiratory: Negative for chest tightness.        Chronic cough  Cardiovascular: Negative for chest pain.  Gastrointestinal: Negative for abdominal distention, abdominal pain and blood in stool.  Endocrine: Negative for hot  flashes.  Genitourinary: Negative for difficulty  urinating and frequency.   Musculoskeletal: Negative for arthralgias.  Skin: Negative for itching and rash.  Neurological: Negative for extremity weakness.  Hematological: Negative for adenopathy.  Psychiatric/Behavioral: Negative for confusion.    MEDICAL HISTORY:  Past Medical History:  Diagnosis Date  . Brain aneurysm   . Difficult intubation   . Hypertension   . Non-small cell cancer of right lung ([place]) 09/22/2017   Chemo + Rad tx's.   . Viral meningitis     SURGICAL HISTORY: Past Surgical History:  Procedure Laterality Date  . ENDOBRONCHIAL ULTRASOUND N/A 09/09/2017   Procedure: ENDOBRONCHIAL ULTRASOUND;  Surgeon: Laverle Hobby, MD;  Location: [facility] ORS;  Service: Pulmonary;  Laterality: N/A;  . OOPHORECTOMY Left   . PORTA CATH INSERTION N/A 09/29/2017   Procedure: PORTA CATH INSERTION;  Surgeon: Algernon Huxley, MD;  Location: [place] [facility];  Service: Cardiovascular;  Laterality: N/A;  . PORTA CATH INSERTION N/A 01/03/2018   Procedure: PORTA CATH INSERTION;  Surgeon: Algernon Huxley, MD;  Location: [place] [facility];  Service: Cardiovascular;  Laterality: N/A;  . surgical repair for brain tumor  1969   clips in head  . WISDOM TOOTH EXTRACTION      SOCIAL HISTORY: Social History   Socioeconomic History  . Marital status: Married    Spouse name: Not on file  . Number of children: Not on file  . Years of education: Not on file  . Highest education level: Not on file  Occupational History  . Not on file  Social Needs  . Financial resource strain: Not on file  . Food insecurity:    Worry: Not on file    Inability: Not on file  . Transportation needs:    Medical: Not on file    Non-medical: Not on file  Tobacco Use  . Smoking status: Former Smoker    Packs/day: 0.25    Last attempt to quit: 10/03/2017    Years since quitting: 1.0  . Smokeless tobacco: Never Used  Substance and Sexual Activity  . Alcohol use: Not Currently    Comment: social  . Drug  use: Not Currently    Types: Cocaine  . Sexual activity: Not on file  Lifestyle  . Physical activity:    Days per week: Not on file    Minutes per session: Not on file  . Stress: Not on file  Relationships  . Social connections:    Talks on phone: Not on file    Gets together: Not on file    Attends religious service: Not on file    Active member of club or organization: Not on file    Attends meetings of clubs or organizations: Not on file    Relationship status: Not on file  . Intimate partner violence:    Fear of current or ex partner: Not on file    Emotionally abused: Not on file    Physically abused: Not on file    Forced sexual activity: Not on file  Other Topics Concern  . Not on file  Social History Narrative  . Not on file    FAMILY HISTORY: Family History  Problem Relation Age of Onset  . Diabetes Mother   . Hypertension Mother   . Hyperlipidemia Mother   . Arthritis Mother   . Hypertension Father   . Hyperlipidemia Father   . Heart attack Father        x2  . Breast cancer  Neg Hx     ALLERGIES:  has No Known Allergies.  MEDICATIONS:  Current Outpatient Medications  Medication Sig Dispense Refill  . amLODipine (NORVASC) 10 MG tablet Take 10 mg by mouth daily.  11  . apixaban (ELIQUIS) 5 MG TABS tablet Take 1 tablet (5 mg total) by mouth 2 (two) times daily. 60 tablet 1  . Cyanocobalamin (B-12) 1000 MCG CAPS Take 1,000 mcg by mouth daily. 30 capsule 3  . docusate sodium (COLACE) 100 MG capsule Take 100 mg by mouth daily.    Marland Kitchen lidocaine-prilocaine (EMLA) cream Apply to affected area once 30 g 3  . ondansetron (ZOFRAN) 8 MG tablet Take 1 tablet (8 mg total) by mouth 2 (two) times daily as needed for refractory nausea / vomiting. Start on day 3 after chemo. 30 tablet 1  . potassium chloride SA (K-DUR) 20 MEQ tablet Take 1 tablet (20 mEq total) by mouth daily. 3 tablet 0  . prochlorperazine (COMPAZINE) 10 MG tablet Take 1 tablet (10 mg total) by mouth every 6  (six) hours as needed (Nausea or vomiting). 30 tablet 1  . umeclidinium-vilanterol (ANORO ELLIPTA) 62.5-25 MCG/INH AEPB Inhale 1 puff into the lungs daily as needed (shortness of breath).      No current facility-administered medications for this visit.      PHYSICAL EXAMINATION: ECOG PERFORMANCE STATUS: 0 - Asymptomatic Vitals:   10/31/18 0851  BP: 133/76  Pulse: 86  Resp: 18  Temp: 97.9 F (36.6 C)   Filed Weights   10/31/18 0851  Weight: 185 lb (83.9 kg)    Physical Exam Constitutional:      General: She is not in acute distress. HENT:     Head: Normocephalic and atraumatic.  Eyes:     General: No scleral icterus.       Left eye: No discharge.     Conjunctiva/sclera: Conjunctivae normal.     Pupils: Pupils are equal, round, and reactive to light.  Neck:     Musculoskeletal: Normal range of motion and neck supple.     Comments: No neck swelling Cardiovascular:     Rate and Rhythm: Normal rate and regular rhythm.     Heart sounds: Normal heart sounds. No murmur.  Pulmonary:     Effort: Pulmonary effort is normal. No respiratory distress.     Breath sounds: Normal breath sounds. No wheezing or rales.  Chest:     Chest wall: No tenderness.  Abdominal:     General: Bowel sounds are normal. There is no distension.     Palpations: Abdomen is soft. There is no mass.     Tenderness: There is no abdominal tenderness. There is no guarding.  Musculoskeletal: Normal range of motion.        General: No deformity.  Lymphadenopathy:     Cervical: No cervical adenopathy.  Skin:    General: Skin is warm and dry.     Findings: No erythema or rash.     Comments: Right anterior chest wall + Mediport, no surrounding swelling or erythema.  Neurological:     Mental Status: She is alert and oriented to person, place, and time.     Cranial Nerves: No cranial nerve deficit.     Coordination: Coordination normal.  Psychiatric:        Behavior: Behavior normal.        Thought  Content: Thought content normal.      LABORATORY DATA:  I have reviewed the data as listed Lab Results  Component Value Date   WBC 5.5 10/28/2018   HGB 11.7 (L) 10/28/2018   HCT 35.4 (L) 10/28/2018   MCV 102.6 (H) 10/28/2018   PLT 317 10/28/2018   Recent Labs    10/03/18 0839 10/17/18 1011 10/28/18 1104  NA 138 138 138  K 3.1* 3.7 3.7  CL 106 107 104  CO2 25 26 26   GLUCOSE 123* 126* 139*  BUN 7* 8 6*  CREATININE 0.55 0.57 0.61  CALCIUM 8.7* 8.6* 9.1  GFRNONAA >60 >60 >60  GFRAA >60 >60 >60  PROT 6.9 7.1 7.4  ALBUMIN 3.9 3.9 4.1  AST 16 28 19   ALT 18 44 20  ALKPHOS 97 120 104  BILITOT 0.5 0.7 0.4   RADIOGRAPHIC STUDIES: I have personally reviewed the radiological images as listed and agreed with the findings in the report. 12/20/2017 Repeat CT scan showed interval decreased of disease. CT images were Independently reviewed by me and discussed with patient. New 29mm right lung lesion, indeterminate.  02/25/2018 CT chest with contrast, presumed evolutionary changes of radiation therapy in the upper right hemithorax.  Aortic atherosclerosis, emphysema.  ASSESSMENT & PLAN:  Cancer Staging Non-small cell lung cancer ([place]) Staging form: Lung, AJCC 8th Edition - Clinical stage from 09/22/2017: Stage Unknown (cTX, cN1, cM0) - Signed by Earlie Server, MD on 09/22/2017 - Pathologic: No stage assigned - Unsigned  1. Non-small cell cancer of right lung ([place])   2. Encounter for antineoplastic immunotherapy   3. Port-A-Cath in place   4. Chronic anticoagulation   5. Thrombosis   6. Macrocytic anemia    #Clinical stage IIIA lung NSCLC, favoring adenocarcinoma Clinically patient tolerates immunotherapy well. Most recent CT on 09/29/2018 shows no recurrence.  Stable. Labs are reviewed and discussed with patient. Counts acceptable to proceed with durvalumab treatment on 10/31/2018. TSH has been followed which has been stable. #Anemia, hemoglobin 11.7, stable.  Chronic macrocytosis. No  vitamin B12 and folate deficiency.  #Catheter provoked right IJ thrombosis, continue Eliquis 5 mg twice daily tolerating well. Plan to discontinue Mediport after she finishes 1 year immunotherapy and after that will consider stopping anticoagulation.  RTC 2 weeks with repeat CBC and CMP MD assessment prior to immunotherapy.Earlie Server, MD, PhD

## 2018-10-31 ENCOUNTER — Inpatient Hospital Stay: Payer: Medicare HMO | Attending: Oncology

## 2018-10-31 ENCOUNTER — Other Ambulatory Visit: Payer: Self-pay

## 2018-10-31 ENCOUNTER — Inpatient Hospital Stay: Payer: Medicare HMO

## 2018-10-31 VITALS — BP 141/76 | HR 77 | Temp 97.6°F | Resp 18 | Wt 187.6 lb

## 2018-10-31 DIAGNOSIS — Z79899 Other long term (current) drug therapy: Secondary | ICD-10-CM | POA: Insufficient documentation

## 2018-10-31 DIAGNOSIS — C3491 Malignant neoplasm of unspecified part of right bronchus or lung: Secondary | ICD-10-CM | POA: Insufficient documentation

## 2018-10-31 DIAGNOSIS — C3490 Malignant neoplasm of unspecified part of unspecified bronchus or lung: Secondary | ICD-10-CM

## 2018-10-31 DIAGNOSIS — Z5112 Encounter for antineoplastic immunotherapy: Secondary | ICD-10-CM | POA: Insufficient documentation

## 2018-10-31 MED ORDER — HEPARIN SOD (PORK) LOCK FLUSH 100 UNIT/ML IV SOLN
500.0000 [IU] | Freq: Once | INTRAVENOUS | Status: AC | PRN
  Administered 2018-10-31: 12:00:00 500 [IU]

## 2018-10-31 MED ORDER — SODIUM CHLORIDE 0.9% FLUSH
10.0000 mL | INTRAVENOUS | Status: DC | PRN
  Administered 2018-10-31: 10:00:00 10 mL
  Filled 2018-10-31: qty 10

## 2018-10-31 MED ORDER — SODIUM CHLORIDE 0.9 % IV SOLN
Freq: Once | INTRAVENOUS | Status: AC
  Administered 2018-10-31: 10:00:00 via INTRAVENOUS
  Filled 2018-10-31: qty 250

## 2018-10-31 MED ORDER — SODIUM CHLORIDE 0.9 % IV SOLN
10.0000 mg/kg | Freq: Once | INTRAVENOUS | Status: AC
  Administered 2018-10-31: 10:00:00 860 mg via INTRAVENOUS
  Filled 2018-10-31: qty 10

## 2018-10-31 MED ORDER — HEPARIN SOD (PORK) LOCK FLUSH 100 UNIT/ML IV SOLN
INTRAVENOUS | Status: AC
  Filled 2018-10-31: qty 5

## 2018-11-14 ENCOUNTER — Encounter: Payer: Self-pay | Admitting: Oncology

## 2018-11-14 ENCOUNTER — Inpatient Hospital Stay: Payer: Medicare HMO

## 2018-11-14 ENCOUNTER — Inpatient Hospital Stay (HOSPITAL_BASED_OUTPATIENT_CLINIC_OR_DEPARTMENT_OTHER): Payer: Medicare HMO | Admitting: Oncology

## 2018-11-14 ENCOUNTER — Other Ambulatory Visit: Payer: Self-pay

## 2018-11-14 VITALS — BP 142/93 | HR 81 | Temp 97.3°F | Resp 18 | Wt 188.1 lb

## 2018-11-14 DIAGNOSIS — Z5112 Encounter for antineoplastic immunotherapy: Secondary | ICD-10-CM | POA: Diagnosis not present

## 2018-11-14 DIAGNOSIS — I8290 Acute embolism and thrombosis of unspecified vein: Secondary | ICD-10-CM

## 2018-11-14 DIAGNOSIS — I82C19 Acute embolism and thrombosis of unspecified internal jugular vein: Secondary | ICD-10-CM

## 2018-11-14 DIAGNOSIS — C3490 Malignant neoplasm of unspecified part of unspecified bronchus or lung: Secondary | ICD-10-CM

## 2018-11-14 DIAGNOSIS — D7589 Other specified diseases of blood and blood-forming organs: Secondary | ICD-10-CM

## 2018-11-14 DIAGNOSIS — Z7901 Long term (current) use of anticoagulants: Secondary | ICD-10-CM

## 2018-11-14 DIAGNOSIS — I82B11 Acute embolism and thrombosis of right subclavian vein: Secondary | ICD-10-CM | POA: Diagnosis not present

## 2018-11-14 DIAGNOSIS — C3491 Malignant neoplasm of unspecified part of right bronchus or lung: Secondary | ICD-10-CM

## 2018-11-14 DIAGNOSIS — I82C11 Acute embolism and thrombosis of right internal jugular vein: Secondary | ICD-10-CM

## 2018-11-14 DIAGNOSIS — R059 Cough, unspecified: Secondary | ICD-10-CM

## 2018-11-14 DIAGNOSIS — Z720 Tobacco use: Secondary | ICD-10-CM

## 2018-11-14 DIAGNOSIS — R05 Cough: Secondary | ICD-10-CM

## 2018-11-14 DIAGNOSIS — Z95828 Presence of other vascular implants and grafts: Secondary | ICD-10-CM

## 2018-11-14 LAB — COMPREHENSIVE METABOLIC PANEL
ALT: 19 U/L (ref 0–44)
AST: 22 U/L (ref 15–41)
Albumin: 3.9 g/dL (ref 3.5–5.0)
Alkaline Phosphatase: 114 U/L (ref 38–126)
Anion gap: 8 (ref 5–15)
BUN: 6 mg/dL — ABNORMAL LOW (ref 8–23)
CO2: 24 mmol/L (ref 22–32)
Calcium: 8.7 mg/dL — ABNORMAL LOW (ref 8.9–10.3)
Chloride: 108 mmol/L (ref 98–111)
Creatinine, Ser: 0.61 mg/dL (ref 0.44–1.00)
GFR calc Af Amer: >60 mL/min (ref >60)
GFR calc non Af Amer: >60 mL/min (ref >60)
Glucose, Bld: 128 mg/dL — ABNORMAL HIGH (ref 70–99)
Potassium: 3.6 mmol/L (ref 3.5–5.1)
Sodium: 140 mmol/L (ref 135–145)
Total Bilirubin: 0.3 mg/dL (ref 0.3–1.2)
Total Protein: 7.2 g/dL (ref 6.5–8.1)

## 2018-11-14 LAB — CBC WITH DIFFERENTIAL/PLATELET
Abs Immature Granulocytes: 0.02 10*3/uL (ref 0.00–0.07)
Basophils Absolute: 0 10*3/uL (ref 0.0–0.1)
Basophils Relative: 1 %
Eosinophils Absolute: 0.7 10*3/uL — ABNORMAL HIGH (ref 0.0–0.5)
Eosinophils Relative: 13 %
HCT: 36 % (ref 36.0–46.0)
Hemoglobin: 12.1 g/dL (ref 12.0–15.0)
Immature Granulocytes: 0 %
Lymphocytes Relative: 27 %
Lymphs Abs: 1.5 10*3/uL (ref 0.7–4.0)
MCH: 34.5 pg — ABNORMAL HIGH (ref 26.0–34.0)
MCHC: 33.6 g/dL (ref 30.0–36.0)
MCV: 102.6 fL — ABNORMAL HIGH (ref 80.0–100.0)
Monocytes Absolute: 0.4 10*3/uL (ref 0.1–1.0)
Monocytes Relative: 7 %
Neutro Abs: 2.9 10*3/uL (ref 1.7–7.7)
Neutrophils Relative %: 52 %
Platelets: 287 10*3/uL (ref 150–400)
RBC: 3.51 MIL/uL — ABNORMAL LOW (ref 3.87–5.11)
RDW: 12.9 % (ref 11.5–15.5)
WBC: 5.5 10*3/uL (ref 4.0–10.5)
nRBC: 0 % (ref 0.0–0.2)

## 2018-11-14 LAB — RETIC PANEL
Immature Retic Fract: 17.9 % — ABNORMAL HIGH (ref 2.3–15.9)
RBC.: 3.51 MIL/uL — ABNORMAL LOW (ref 3.87–5.11)
Retic Count, Absolute: 88.8 10*3/uL (ref 19.0–186.0)
Retic Ct Pct: 2.5 % (ref 0.4–3.1)
Reticulocyte Hemoglobin: 38.4 pg (ref >27.9)

## 2018-11-14 LAB — TSH: TSH: 2.454 u[IU]/mL (ref 0.350–4.500)

## 2018-11-14 MED ORDER — SODIUM CHLORIDE 0.9 % IV SOLN
Freq: Once | INTRAVENOUS | Status: AC
  Administered 2018-11-14: 10:00:00 via INTRAVENOUS
  Filled 2018-11-14: qty 250

## 2018-11-14 MED ORDER — SODIUM CHLORIDE 0.9 % IV SOLN
10.0000 mg/kg | Freq: Once | INTRAVENOUS | Status: AC
  Administered 2018-11-14: 860 mg via INTRAVENOUS
  Filled 2018-11-14: qty 10

## 2018-11-14 MED ORDER — HEPARIN SOD (PORK) LOCK FLUSH 100 UNIT/ML IV SOLN
500.0000 [IU] | Freq: Once | INTRAVENOUS | Status: AC | PRN
  Administered 2018-11-14: 500 [IU]
  Filled 2018-11-14: qty 5

## 2018-11-14 NOTE — Progress Notes (Signed)
Hematology/Oncology follow up note [facility] Telephone:(336) [phone] [phone]   Patient Care Team: Sharyne Peach, MD as PCP - General (Family Medicine) Earlie Server, MD as Medical Oncologist (Medical Oncology) Telford Nab, RN as Registered Nurse  REASON FOR VISIT Follow-up for evaluation prior to immunotherapy for stage IIIA non-small cell lung cancer-adenocarcinoma   HISTORY OF PRESENTING ILLNESS:  Connie Powers is a  68 y.o.  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  CT lung cancer screen 05/27/2017   1. A few bilateral pulmonary nodules measuring up to 5 mm are indeterminate. Considering the suspicious 2.0 cm mediastinal adenopathy, follow up with PET/CT is recommended. 2. Mild bronchial wall thickening could represent airway disease including bronchitis.ACR Lung-RADS Category and Recommendation*: ACR Lung-RADS Category 2S (S modifier for mediastinal adenopathy)  PET scan 3/112019 1. No hypermetabolic pulmonary nodules.2. Hypermetabolic enlarged right paratracheal lymph node, of uncertain etiology in isolation. Lymphoproliferative disorder cannot be excluded. 3.  Aortic atherosclerosis (ICD10-170.0).  EBUS biopsy of paratracheal node showed non small cell lung cancer favoring adenocarcinoma. Case was discussed on tumor board on 09/16/2017 and consensus recommendation is to treat as Stage IIIA disease given the invasion of mediastinum.   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 MRI brain is contraindicated. CT brain negative for metastatic disease.  During the interval patient also was referred to vascular surgery for evaluation of malposition of Medi port.  Patient's right jugular port was removed and right internal jugular vein Mediport was placed by Dr.Dew on 8/5 2019.  # CT chest scan done 06/02/2018 which was independent reviewed by me. CT  chest showed radiation changes in the medial right upper lobe/paramediastinal region.  No evidence of recurrent or metastatic disease. Nonocclusive pericatheter thrombus/fibrin sheath along patient's right chest port. 06/06/2018 US venous upper right extremity ultrasound showed nonocclusive thrombus in the right jugular vein.  The port catheter can be seen associated with this thrombus.  The jugular vein is partially occlusive.  There is also partially occlusive thrombus in the right subclavian vein.  The right axillary, brachial, radial, and ulnar veins are compressible.  Doppler analysis demonstrated phasicity of the Doppler waveform. Patient was started on Eliquis for anticoagulation.   Current Treatment S/p concurrent Chemotherapy [name] /taxol  Weekly x 6] and RT.  Started on maintenance durvalumab every 2 weeks on 12/27/2017. 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 She has stopped smoking.  INTERVAL HISTORY Connie Powers is a 68 y.o. female who has above history reviewed by me today presents for assessment prior to immunotherapy for stage III A non-small cell lung cancer-adenocarcinoma  Patient reports doing well.  Denies any shortness of breath, chest pain, diarrhea or skin rash. 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  Tolerating well.  No bleeding events. .  Review of Systems  Constitutional: Negative for appetite change, chills, fatigue and fever.  HENT:   Negative for hearing loss and voice change.        Neck pain is better  Eyes: Negative for eye problems.  Respiratory: Negative for chest tightness and cough.        Chronic cough  Cardiovascular: Negative for chest pain.  Gastrointestinal: Negative for abdominal distention, abdominal pain and blood in stool.  Endocrine: Negative for hot flashes.  Genitourinary: Negative for difficulty urinating  and frequency.    Musculoskeletal: Negative for arthralgias.  Skin: Negative for itching and rash.  Neurological: Negative for extremity weakness.  Hematological: Negative for adenopathy.  Psychiatric/Behavioral: Negative for confusion.    MEDICAL HISTORY:  Past Medical History:  Diagnosis Date  . Brain aneurysm   . Difficult intubation   . Hypertension   . Non-small cell cancer of right lung ([place]) 09/22/2017   Chemo + Rad tx's.   . Viral meningitis     SURGICAL HISTORY: Past Surgical History:  Procedure Laterality Date  . ENDOBRONCHIAL ULTRASOUND N/A 09/09/2017   Procedure: ENDOBRONCHIAL ULTRASOUND;  Surgeon: Laverle Hobby, MD;  Location: [facility] ORS;  Service: Pulmonary;  Laterality: N/A;  . OOPHORECTOMY Left   . PORTA CATH INSERTION N/A 09/29/2017   Procedure: PORTA CATH INSERTION;  Surgeon: Algernon Huxley, MD;  Location: [place] [facility];  Service: Cardiovascular;  Laterality: N/A;  . PORTA CATH INSERTION N/A 01/03/2018   Procedure: PORTA CATH INSERTION;  Surgeon: Algernon Huxley, MD;  Location: [place] [facility];  Service: Cardiovascular;  Laterality: N/A;  . surgical repair for brain tumor  1969   clips in head  . WISDOM TOOTH EXTRACTION      SOCIAL HISTORY: Social History   Socioeconomic History  . Marital status: Married    Spouse name: Not on file  . Number of children: Not on file  . Years of education: Not on file  . Highest education level: Not on file  Occupational History  . Not on file  Social Needs  . Financial resource strain: Not on file  . Food insecurity    Worry: Not on file    Inability: Not on file  . Transportation needs    Medical: Not on file    Non-medical: Not on file  Tobacco Use  . Smoking status: Former Smoker    Packs/day: 0.25    Quit date: 10/03/2017    Years since quitting: 1.1  . Smokeless tobacco: Never Used  Substance and Sexual Activity  . Alcohol use: Not Currently    Comment: social  . Drug use: Not Currently    Types: Cocaine  .  Sexual activity: Not on file  Lifestyle  . Physical activity    Days per week: Not on file    Minutes per session: Not on file  . Stress: Not on file  Relationships  . Social Herbalist on phone: Not on file    Gets together: Not on file    Attends religious service: Not on file    Active member of club or organization: Not on file    Attends meetings of clubs or organizations: Not on file    Relationship status: Not on file  . Intimate partner violence    Fear of current or ex partner: Not on file    Emotionally abused: Not on file    Physically abused: Not on file    Forced sexual activity: Not on file  Other Topics Concern  . Not on file  Social History Narrative  . Not on file    FAMILY HISTORY: Family History  Problem Relation Age of Onset  . Diabetes Mother   . Hypertension Mother   . Hyperlipidemia Mother   . Arthritis Mother   . Hypertension Father   . Hyperlipidemia Father   . Heart attack Father        x2  . Breast cancer Neg Hx     ALLERGIES:  has  No Known Allergies.  MEDICATIONS:  Current Outpatient Medications  Medication Sig Dispense Refill  . amLODipine (NORVASC) 10 MG tablet Take 10 mg by mouth daily.  11  . apixaban (ELIQUIS) 5 MG TABS tablet Take 1 tablet (5 mg total) by mouth 2 (two) times daily. 60 tablet 1  . Cyanocobalamin (B-12) 1000 MCG CAPS Take 1,000 mcg by mouth daily. 30 capsule 3  . docusate sodium (COLACE) 100 MG capsule Take 100 mg by mouth daily.    Marland Kitchen lidocaine-prilocaine (EMLA) cream Apply to affected area once 30 g 3  . ondansetron (ZOFRAN) 8 MG tablet Take 1 tablet (8 mg total) by mouth 2 (two) times daily as needed for refractory nausea / vomiting. Start on day 3 after chemo. 30 tablet 1  . potassium chloride SA (K-DUR) 20 MEQ tablet Take 1 tablet (20 mEq total) by mouth daily. 3 tablet 0  . prochlorperazine (COMPAZINE) 10 MG tablet Take 1 tablet (10 mg total) by mouth every 6 (six) hours as needed (Nausea or vomiting).  30 tablet 1  . umeclidinium-vilanterol (ANORO ELLIPTA) 62.5-25 MCG/INH AEPB Inhale 1 puff into the lungs daily as needed (shortness of breath).      No current facility-administered medications for this visit.      PHYSICAL EXAMINATION: ECOG PERFORMANCE STATUS: 0 - Asymptomatic Vitals:   11/14/18 0918  BP: (!) 142/93  Pulse: 81  Resp: 18  Temp: (!) 97.3 F (36.3 C)  SpO2: 97%   Filed Weights   11/14/18 0918  Weight: 188 lb 1.6 oz (85.3 kg)    Physical Exam Constitutional:      General: She is not in acute distress. HENT:     Head: Normocephalic and atraumatic.  Eyes:     General: No scleral icterus.       Left eye: No discharge.     Conjunctiva/sclera: Conjunctivae normal.     Pupils: Pupils are equal, round, and reactive to light.  Neck:     Musculoskeletal: Normal range of motion and neck supple.     Comments: No neck swelling Cardiovascular:     Rate and Rhythm: Normal rate and regular rhythm.     Heart sounds: Normal heart sounds. No murmur.  Pulmonary:     Effort: Pulmonary effort is normal. No respiratory distress.     Breath sounds: Normal breath sounds. No wheezing or rales.  Chest:     Chest wall: No tenderness.  Abdominal:     General: Bowel sounds are normal. There is no distension.     Palpations: Abdomen is soft. There is no mass.     Tenderness: There is no abdominal tenderness. There is no guarding.  Musculoskeletal: Normal range of motion.        General: No deformity.  Lymphadenopathy:     Cervical: No cervical adenopathy.  Skin:    General: Skin is warm and dry.     Findings: No erythema or rash.     Comments: Right anterior chest wall + Mediport, no surrounding swelling or erythema.  Neurological:     Mental Status: She is alert and oriented to person, place, and time.     Cranial Nerves: No cranial nerve deficit.     Coordination: Coordination normal.  Psychiatric:        Behavior: Behavior normal.        Thought Content: Thought  content normal.      LABORATORY DATA:  I have reviewed the data as listed Lab Results  Component Value  Date   WBC 5.5 11/14/2018   HGB 12.1 11/14/2018   HCT 36.0 11/14/2018   MCV 102.6 (H) 11/14/2018   PLT 287 11/14/2018   Recent Labs    10/17/18 1011 10/28/18 1104 11/14/18 0836  NA 138 138 140  K 3.7 3.7 3.6  CL 107 104 108  CO2 26 26 24   GLUCOSE 126* 139* 128*  BUN 8 6* 6*  CREATININE 0.57 0.61 0.61  CALCIUM 8.6* 9.1 8.7*  GFRNONAA >60 >60 >60  GFRAA >60 >60 >60  PROT 7.1 7.4 7.2  ALBUMIN 3.9 4.1 3.9  AST 28 19 22   ALT 44 20 19  ALKPHOS 120 104 114  BILITOT 0.7 0.4 0.3   RADIOGRAPHIC STUDIES: I have personally reviewed the radiological images as listed and agreed with the findings in the report. 12/20/2017 Repeat CT scan showed interval decreased of disease. CT images were Independently reviewed by me and discussed with patient. New 53mm right lung lesion, indeterminate.  02/25/2018 CT chest with contrast, presumed evolutionary changes of radiation therapy in the upper right hemithorax.  Aortic atherosclerosis, emphysema.  ASSESSMENT & PLAN:  Cancer Staging Non-small cell lung cancer ([place]) Staging form: Lung, AJCC 8th Edition - Clinical stage from 09/22/2017: Stage Unknown (cTX, cN1, cM0) - Signed by Earlie Server, MD on 09/22/2017 - Pathologic: No stage assigned - Unsigned  1. Non-small cell lung cancer, unspecified laterality ([place])   2. Encounter for antineoplastic immunotherapy   3. Acute embolism and thrombosis of internal jugular vein, unspecified laterality ([place])   4. Macrocytosis without anemia    #Clinical stage IIIA lung NSCLC, favoring adenocarcinoma Clinically patient tolerates immunotherapy very well. Most recent CT on 09/29/2018 shows no recurrence.  Stable disease. Labs are reviewed and discussed with patient. Counts are acceptable to proceed with durvalumab treatments today. TSH has been followed which is stable.  #Anemia, hemoglobin stable.  Chronic  macrocytosis.  No B12 or folate deficiency.  #Catheter provoked right IJ thrombosis.  Continue Eliquis 5 mg twice daily. #Occasionally she smokes cigarettes.  Complete smoke cessation discussed with patient. RTC 2 weeks with repeat CBC and CMP MD assessment prior to immunotherapy.Earlie Server, MD, PhD

## 2018-11-14 NOTE — Progress Notes (Signed)
Patient here for follow up. No concerns voiced.  °

## 2018-11-15 ENCOUNTER — Other Ambulatory Visit: Payer: Self-pay

## 2018-11-16 ENCOUNTER — Encounter: Payer: Self-pay | Admitting: Radiation Oncology

## 2018-11-16 ENCOUNTER — Ambulatory Visit: Payer: Medicare HMO | Admitting: Oncology

## 2018-11-16 ENCOUNTER — Other Ambulatory Visit: Payer: Medicare HMO

## 2018-11-16 ENCOUNTER — Ambulatory Visit: Payer: Medicare HMO

## 2018-11-16 ENCOUNTER — Ambulatory Visit
Admission: RE | Admit: 2018-11-16 | Discharge: 2018-11-16 | Disposition: A | Discharge status: Home / Self Care | Payer: Medicare HMO | Source: Ambulatory Visit | Attending: Radiation Oncology | Admitting: Radiation Oncology

## 2018-11-16 ENCOUNTER — Other Ambulatory Visit: Payer: Self-pay

## 2018-11-16 VITALS — BP 141/83 | HR 93 | Temp 96.8°F | Resp 18 | Wt 187.1 lb

## 2018-11-16 DIAGNOSIS — C771 Secondary and unspecified malignant neoplasm of intrathoracic lymph nodes: Secondary | ICD-10-CM | POA: Insufficient documentation

## 2018-11-16 DIAGNOSIS — Z923 Personal history of irradiation: Secondary | ICD-10-CM | POA: Insufficient documentation

## 2018-11-16 DIAGNOSIS — F17210 Nicotine dependence, cigarettes, uncomplicated: Secondary | ICD-10-CM | POA: Insufficient documentation

## 2018-11-16 DIAGNOSIS — C3491 Malignant neoplasm of unspecified part of right bronchus or lung: Secondary | ICD-10-CM | POA: Insufficient documentation

## 2018-11-16 NOTE — Progress Notes (Signed)
Radiation Oncology Follow up Note  Name: Connie Powers   Date:   11/16/2018 MRN:  [id number] DOB: 09/24/1950    This 68 y.o. 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  Seen today in routine follow-up she is doing well.  She specifically denies cough hemoptysis chest tightness or bone pain.  She had a recent CT scan.  Showing continued evolution of post radiation changes in the upper parahilar paramediastinal area with no findings suspicious for metastatic disease or recurrent disease in the chest.  COMPLICATIONS OF TREATMENT: none  FOLLOW UP COMPLIANCE: keeps appointments   PHYSICAL EXAM:  BP (!) 141/83 (BP Location: Left Arm, Patient Position: Sitting)   Pulse 93   Temp (!) 96.8 F (36 C) (Tympanic)   Resp 18   Wt 187 lb 1 oz (84.8 kg)   BMI 32.11 kg/m  Well-developed well-nourished patient in NAD. HEENT reveals PERLA, EOMI, discs not visualized.  Oral cavity is clear. No oral mucosal lesions are identified. Neck is clear without evidence of cervical or supraclavicular adenopathy. Lungs are clear to A&P. Cardiac examination is essentially unremarkable with regular rate and rhythm without murmur rub or thrill. Abdomen is benign with no organomegaly or masses noted. Motor sensory and DTR levels are equal and symmetric in the upper and lower extremities. Cranial nerves II through XII are grossly intact. Proprioception is intact. No peripheral adenopathy or edema is identified. No motor or sensory levels are noted. Crude visual fields are within normal range.  RADIOLOGY RESULTS: CT scans reviewed and compatible with above-stated findings  PLAN: Present time  patient is doing well under excellent control of her disease by CT criteria.  I am pleased with her overall progress.  I have asked to see her out in 1 year for follow-up.  She continues close follow-up care with medical oncology.  Patient knows to call at anytime with any concerns.  I would like to take this opportunity to thank you for allowing me to participate in the care of your patient.Noreene Filbert, MD

## 2018-11-25 ENCOUNTER — Other Ambulatory Visit: Payer: Self-pay

## 2018-11-25 DIAGNOSIS — C3491 Malignant neoplasm of unspecified part of right bronchus or lung: Secondary | ICD-10-CM

## 2018-11-28 ENCOUNTER — Inpatient Hospital Stay (HOSPITAL_BASED_OUTPATIENT_CLINIC_OR_DEPARTMENT_OTHER): Payer: Medicare HMO | Admitting: Oncology

## 2018-11-28 ENCOUNTER — Encounter: Payer: Self-pay | Admitting: Oncology

## 2018-11-28 ENCOUNTER — Inpatient Hospital Stay: Payer: Medicare HMO

## 2018-11-28 ENCOUNTER — Other Ambulatory Visit: Payer: Self-pay

## 2018-11-28 VITALS — BP 128/75 | HR 67 | Temp 96.5°F | Wt 187.4 lb

## 2018-11-28 DIAGNOSIS — Z5112 Encounter for antineoplastic immunotherapy: Secondary | ICD-10-CM | POA: Diagnosis not present

## 2018-11-28 DIAGNOSIS — I8290 Acute embolism and thrombosis of unspecified vein: Secondary | ICD-10-CM

## 2018-11-28 DIAGNOSIS — Z7901 Long term (current) use of anticoagulants: Secondary | ICD-10-CM

## 2018-11-28 DIAGNOSIS — I82B11 Acute embolism and thrombosis of right subclavian vein: Secondary | ICD-10-CM

## 2018-11-28 DIAGNOSIS — Z95828 Presence of other vascular implants and grafts: Secondary | ICD-10-CM

## 2018-11-28 DIAGNOSIS — C3491 Malignant neoplasm of unspecified part of right bronchus or lung: Secondary | ICD-10-CM

## 2018-11-28 DIAGNOSIS — I82C11 Acute embolism and thrombosis of right internal jugular vein: Secondary | ICD-10-CM

## 2018-11-28 DIAGNOSIS — C3490 Malignant neoplasm of unspecified part of unspecified bronchus or lung: Secondary | ICD-10-CM

## 2018-11-28 LAB — CBC WITH DIFFERENTIAL/PLATELET
Abs Immature Granulocytes: 0.01 10*3/uL (ref 0.00–0.07)
Basophils Absolute: 0 10*3/uL (ref 0.0–0.1)
Basophils Relative: 1 %
Eosinophils Absolute: 0.5 10*3/uL (ref 0.0–0.5)
Eosinophils Relative: 8 %
HCT: 35.8 % — ABNORMAL LOW (ref 36.0–46.0)
Hemoglobin: 11.9 g/dL — ABNORMAL LOW (ref 12.0–15.0)
Immature Granulocytes: 0 %
Lymphocytes Relative: 28 %
Lymphs Abs: 1.5 10*3/uL (ref 0.7–4.0)
MCH: 33.8 pg (ref 26.0–34.0)
MCHC: 33.2 g/dL (ref 30.0–36.0)
MCV: 101.7 fL — ABNORMAL HIGH (ref 80.0–100.0)
Monocytes Absolute: 0.4 10*3/uL (ref 0.1–1.0)
Monocytes Relative: 7 %
Neutro Abs: 3 10*3/uL (ref 1.7–7.7)
Neutrophils Relative %: 56 %
Platelets: 298 10*3/uL (ref 150–400)
RBC: 3.52 MIL/uL — ABNORMAL LOW (ref 3.87–5.11)
RDW: 12.7 % (ref 11.5–15.5)
WBC: 5.4 10*3/uL (ref 4.0–10.5)
nRBC: 0 % (ref 0.0–0.2)

## 2018-11-28 LAB — COMPREHENSIVE METABOLIC PANEL
ALT: 19 U/L (ref 0–44)
AST: 17 U/L (ref 15–41)
Albumin: 3.9 g/dL (ref 3.5–5.0)
Alkaline Phosphatase: 99 U/L (ref 38–126)
Anion gap: 7 (ref 5–15)
BUN: 9 mg/dL (ref 8–23)
CO2: 26 mmol/L (ref 22–32)
Calcium: 8.7 mg/dL — ABNORMAL LOW (ref 8.9–10.3)
Chloride: 105 mmol/L (ref 98–111)
Creatinine, Ser: 0.48 mg/dL (ref 0.44–1.00)
GFR calc Af Amer: >60 mL/min (ref >60)
GFR calc non Af Amer: >60 mL/min (ref >60)
Glucose, Bld: 102 mg/dL — ABNORMAL HIGH (ref 70–99)
Potassium: 3.4 mmol/L — ABNORMAL LOW (ref 3.5–5.1)
Sodium: 138 mmol/L (ref 135–145)
Total Bilirubin: 0.4 mg/dL (ref 0.3–1.2)
Total Protein: 7.1 g/dL (ref 6.5–8.1)

## 2018-11-28 MED ORDER — SODIUM CHLORIDE 0.9 % IV SOLN
Freq: Once | INTRAVENOUS | Status: AC
  Administered 2018-11-28: 10:00:00 via INTRAVENOUS
  Filled 2018-11-28: qty 250

## 2018-11-28 MED ORDER — APIXABAN 5 MG PO TABS: 5.0000 mg | ORAL_TABLET | Freq: Two times a day (BID) | ORAL | 0 refills | Status: DC

## 2018-11-28 MED ORDER — SODIUM CHLORIDE 0.9% FLUSH
10.0000 mL | Freq: Once | INTRAVENOUS | Status: AC
  Administered 2018-11-28: 09:00:00 10 mL via INTRAVENOUS
  Filled 2018-11-28: qty 10

## 2018-11-28 MED ORDER — POTASSIUM CHLORIDE CRYS ER 20 MEQ PO TBCR: 20.0000 meq | EXTENDED_RELEASE_TABLET | Freq: Every day | ORAL | 0 refills | Status: DC

## 2018-11-28 MED ORDER — SODIUM CHLORIDE 0.9 % IV SOLN
10.0000 mg/kg | Freq: Once | INTRAVENOUS | Status: AC
  Administered 2018-11-28: 11:00:00 860 mg via INTRAVENOUS
  Filled 2018-11-28: qty 10

## 2018-11-28 MED ORDER — HEPARIN SOD (PORK) LOCK FLUSH 100 UNIT/ML IV SOLN
500.0000 [IU] | Freq: Once | INTRAVENOUS | Status: AC | PRN
  Administered 2018-11-28: 500 [IU]
  Filled 2018-11-28: qty 5

## 2018-11-28 NOTE — Progress Notes (Signed)
Hematology/Oncology follow up note [facility] Telephone:(336) [phone] [phone]   Patient Care Team: Sharyne Peach, MD as PCP - General (Family Medicine) Earlie Server, MD as Medical Oncologist (Medical Oncology) Telford Nab, RN as Registered Nurse  REASON FOR VISIT Follow-up for evaluation prior to immunotherapy for stage IIIA non-small cell lung cancer-adenocarcinoma   HISTORY OF PRESENTING ILLNESS:  Connie Powers is a  68 y.o.  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  CT lung cancer screen 05/27/2017   1. A few bilateral pulmonary nodules measuring up to 5 mm are indeterminate. Considering the suspicious 2.0 cm mediastinal adenopathy, follow up with PET/CT is recommended. 2. Mild bronchial wall thickening could represent airway disease including bronchitis.ACR Lung-RADS Category and Recommendation*: ACR Lung-RADS Category 2S (S modifier for mediastinal adenopathy)  PET scan 3/112019 1. No hypermetabolic pulmonary nodules.2. Hypermetabolic enlarged right paratracheal lymph node, of uncertain etiology in isolation. Lymphoproliferative disorder cannot be excluded. 3.  Aortic atherosclerosis (ICD10-170.0).  EBUS biopsy of paratracheal node showed non small cell lung cancer favoring adenocarcinoma. Case was discussed on tumor board on 09/16/2017 and consensus recommendation is to treat as Stage IIIA disease given the invasion of mediastinum.   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 MRI brain is contraindicated. CT brain negative for metastatic disease.  During the interval patient also was referred to vascular surgery for evaluation of malposition of Medi port.  Patient's right jugular port was removed and right internal jugular vein Mediport was placed by Dr.Dew on 8/5 2019.  # CT chest scan done 06/02/2018 which was independent reviewed by me. CT  chest showed radiation changes in the medial right upper lobe/paramediastinal region.  No evidence of recurrent or metastatic disease. Nonocclusive pericatheter thrombus/fibrin sheath along patient's right chest port. 06/06/2018 US venous upper right extremity ultrasound showed nonocclusive thrombus in the right jugular vein.  The port catheter can be seen associated with this thrombus.  The jugular vein is partially occlusive.  There is also partially occlusive thrombus in the right subclavian vein.  The right axillary, brachial, radial, and ulnar veins are compressible.  Doppler analysis demonstrated phasicity of the Doppler waveform. Patient was started on Eliquis for anticoagulation.   Current Treatment S/p concurrent Chemotherapy [name] /taxol  Weekly x 6] and RT.  Started on maintenance durvalumab every 2 weeks on 12/27/2017. 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 She has stopped smoking.  INTERVAL HISTORY Connie Powers is a 68 y.o. female who has above history reviewed by me today presents for assessment prior to immunotherapy for stage III A non-small cell lung cancer-adenocarcinoma  Reports doing well. Denies any SOB, chest pain, diarrhea or skin rahs.  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  No bleeding events.     Review of Systems  Constitutional: Negative for appetite change, chills, fatigue and fever.  HENT:   Negative for hearing loss and voice change.   Eyes: Negative for eye problems.  Respiratory: Negative for chest tightness and cough.        Chronic cough  Cardiovascular: Negative for chest pain.  Gastrointestinal: Negative for abdominal distention, abdominal pain and blood in stool.  Endocrine: Negative for hot flashes.  Genitourinary: Negative for difficulty urinating and frequency.   Musculoskeletal: Negative for arthralgias.  Skin: Negative for itching and rash.  Neurological:  Negative for extremity weakness.  Hematological: Negative for adenopathy.  Psychiatric/Behavioral: Negative for confusion.    MEDICAL HISTORY:  Past Medical History:  Diagnosis Date  . Brain aneurysm   . Difficult intubation   . Hypertension   . Non-small cell cancer of right lung ([place]) 09/22/2017   Chemo + Rad tx's.   . Viral meningitis     SURGICAL HISTORY: Past Surgical History:  Procedure Laterality Date  . ENDOBRONCHIAL ULTRASOUND N/A 09/09/2017   Procedure: ENDOBRONCHIAL ULTRASOUND;  Surgeon: Laverle Hobby, MD;  Location: [facility] OR[facility];  Service: Pulmonary;  Laterality: N/A;  . OOPHORECTOMY Left   . PORTA CATH INSERTION N/A 09/29/2017   Procedure: PORTA CATH INSERTION;  Surgeon: Algernon Huxley, MD;  Location: [facility];  Service: Cardiovascular;  Laterality: N/A;  . PORTA CATH INSERTION N/A 01/03/2018   Procedure: PORTA CATH INSERTION;  Surgeon: Algernon Huxley, MD;  Location: [facility];  Service: Cardiovascular;  Laterality: N/A;  . surgical repair for brain tumor  1969   clips in head  . WISDOM TOOTH EXTRACTION      SOCIAL HISTORY: Social History   Socioeconomic History  . Marital status: Married    Spouse name: Not on file  . Number of children: Not on file  . Years of education: Not on file  . Highest education level: Not on file  Occupational History  . Not on file  Social Needs  . Financial resource strain: Not on file  . Food insecurity    Worry: Not on file    Inability: Not on file  . Transportation needs    Medical: Not on file    Non-medical: Not on file  Tobacco Use  . Smoking status: Former Smoker    Packs/day: 0.25    Quit date: 10/03/2017    Years since quitting: 1.1  . Smokeless tobacco: Never Used  Substance and Sexual Activity  . Alcohol use: Not Currently    Comment: social  . Drug use: Not Currently    Types: Cocaine  . Sexual activity: Not on file  Lifestyle  . Physical activity    Days per week: Not on file     Minutes per session: Not on file  . Stress: Not on file  Relationships  . Social Herbalist on phone: Not on file    Gets together: Not on file    Attends religious service: Not on file    Active member of club or organization: Not on file    Attends meetings of clubs or organizations: Not on file    Relationship status: Not on file  . Intimate partner violence    Fear of current or ex partner: Not on file    Emotionally abused: Not on file    Physically abused: Not on file    Forced sexual activity: Not on file  Other Topics Concern  . Not on file  Social History Narrative  . Not on file    FAMILY HISTORY: Family History  Problem Relation Age of Onset  . Diabetes Mother   . Hypertension Mother   . Hyperlipidemia Mother   . Arthritis Mother   . Hypertension Father   . Hyperlipidemia Father   . Heart attack Father        x2  . Breast cancer Neg Hx     ALLERGIES:  has No Known Allergies.  MEDICATIONS:  Current Outpatient Medications  Medication Sig Dispense Refill  . amLODipine (NORVASC)  10 MG tablet Take 10 mg by mouth daily.  11  . apixaban (ELIQUIS) 5 MG TABS tablet Take 1 tablet (5 mg total) by mouth 2 (two) times daily. 60 tablet 0  . Cyanocobalamin (B-12) 1000 MCG CAPS Take 1,000 mcg by mouth daily. 30 capsule 3  . docusate sodium (COLACE) 100 MG capsule Take 100 mg by mouth daily.    Marland Kitchen lidocaine-prilocaine (EMLA) cream Apply to affected area once 30 g 3  . ondansetron (ZOFRAN) 8 MG tablet Take 1 tablet (8 mg total) by mouth 2 (two) times daily as needed for refractory nausea / vomiting. Start on day 3 after chemo. 30 tablet 1  . potassium chloride SA (K-DUR) 20 MEQ tablet Take 1 tablet (20 mEq total) by mouth daily. 3 tablet 0  . prochlorperazine (COMPAZINE) 10 MG tablet Take 1 tablet (10 mg total) by mouth every 6 (six) hours as needed (Nausea or vomiting). 30 tablet 1  . umeclidinium-vilanterol (ANORO ELLIPTA) 62.5-25 MCG/INH AEPB Inhale 1 puff into  the lungs daily as needed (shortness of breath).      No current facility-administered medications for this visit.      PHYSICAL EXAMINATION: ECOG PERFORMANCE STATUS: 0 - Asymptomatic Vitals:   11/28/18 0921  BP: 128/75  Pulse: 67  Temp: (!) 96.5 F (35.8 C)   Filed Weights   11/28/18 0921  Weight: 187 lb 7 oz (85 kg)    Physical Exam Constitutional:      General: She is not in acute distress. HENT:     Head: Normocephalic and atraumatic.  Eyes:     General: No scleral icterus.       Left eye: No discharge.     Conjunctiva/sclera: Conjunctivae normal.     Pupils: Pupils are equal, round, and reactive to light.  Neck:     Musculoskeletal: Normal range of motion and neck supple.     Comments: No neck swelling Cardiovascular:     Rate and Rhythm: Normal rate and regular rhythm.     Heart sounds: Normal heart sounds. No murmur.  Pulmonary:     Effort: Pulmonary effort is normal. No respiratory distress.     Breath sounds: Normal breath sounds. No wheezing or rales.  Chest:     Chest wall: No tenderness.  Abdominal:     General: Bowel sounds are normal. There is no distension.     Palpations: Abdomen is soft. There is no mass.     Tenderness: There is no abdominal tenderness. There is no guarding.  Musculoskeletal: Normal range of motion.        General: No deformity.  Lymphadenopathy:     Cervical: No cervical adenopathy.  Skin:    General: Skin is warm and dry.     Findings: No erythema or rash.     Comments: Right anterior chest wall + Mediport, no surrounding swelling or erythema.  Neurological:     Mental Status: She is alert and oriented to person, place, and time.     Cranial Nerves: No cranial nerve deficit.     Coordination: Coordination normal.  Psychiatric:        Behavior: Behavior normal.        Thought Content: Thought content normal.      LABORATORY DATA:  I have reviewed the data as listed Lab Results  Component Value Date   WBC 5.4  11/28/2018   HGB 11.9 (L) 11/28/2018   HCT 35.8 (L) 11/28/2018   MCV 101.7 (H) 11/28/2018  PLT 298 11/28/2018   Recent Labs    10/28/18 1104 11/14/18 0836 11/28/18 0842  NA 138 140 138  K 3.7 3.6 3.4*  CL 104 108 105  CO2 26 24 26   GLUCOSE 139* 128* 102*  BUN 6* 6* 9  CREATININE 0.61 0.61 0.48  CALCIUM 9.1 8.7* 8.7*  GFRNONAA >60 >60 >60  GFRAA >60 >60 >60  PROT 7.4 7.2 7.1  ALBUMIN 4.1 3.9 3.9  AST 19 22 17   ALT 20 19 19   ALKPHOS 104 114 99  BILITOT 0.4 0.3 0.4   RADIOGRAPHIC STUDIES: I have personally reviewed the radiological images as listed and agreed with the findings in the report. 12/20/2017 Repeat CT scan showed interval decreased of disease. CT images were Independently reviewed by me and discussed with patient. New 42mm right lung lesion, indeterminate.  02/25/2018 CT chest with contrast, presumed evolutionary changes of radiation therapy in the upper right hemithorax.  Aortic atherosclerosis, emphysema.  ASSESSMENT & PLAN:  Cancer Staging Non-small cell lung cancer ([place]) Staging form: Lung, AJCC 8th Edition - Clinical stage from 09/22/2017: Stage Unknown (cTX, cN1, cM0) - Signed by Earlie Server, MD on 09/22/2017 - Pathologic: No stage assigned - Unsigned  1. Non-small cell cancer of right lung ([place])   2. Port-A-Cath in place   3. Chronic anticoagulation   4. Encounter for antineoplastic immunotherapy   5. Thrombosis    #Clinical stage IIIA lung NSCLC, favoring adenocarcinoma Clinically tolerating immunotherapy very well.  Labs reviewed and discussed with patient Counts acceptable to proceed with durvalumab treatment today. TSH has been followed which is stable.  #She has 1 more durvalumab treatment in 2 weeks.  After that she will be completing 1 year of durvalumab maintenance. We discussed about Mediport. Since she has had Mediport related thrombosis and has been on anticoagulation, options were discussed. One option will be discontinue Mediport, and  anticoagulation will be discontinued as well. The other option will be maintained Mediport every 6 weeks, continue anticoagulation.  If she stays in remission for 2 years, will discontinue Mediport at that point.She has been on anticoagulation for 6 months.  Can switch to low-dose Eliquis for maintenance.  Patient wants to proceed with Mediport discontinuation and also eventually come off and evaluation as well.  #Catheter provoked right IJ thrombosis.  For now continue Eliquis 5 mg twice daily.  RTC 2 weeks with repeat CBC and CMP MD assessment prior to immunotherapy.Earlie Server, MD, PhD

## 2018-11-28 NOTE — Addendum Note (Signed)
Addended by: Vernetta Honey on: 11/28/2018 01:21 PM   Modules accepted: Orders

## 2018-11-28 NOTE — Progress Notes (Signed)
Patient here today for follow up and chemotherapy.  Patient states no new concerns today

## 2018-12-12 ENCOUNTER — Inpatient Hospital Stay: Payer: Medicare HMO | Attending: Oncology

## 2018-12-12 ENCOUNTER — Encounter: Payer: Self-pay | Admitting: Oncology

## 2018-12-12 ENCOUNTER — Other Ambulatory Visit: Payer: Self-pay

## 2018-12-12 ENCOUNTER — Inpatient Hospital Stay: Payer: Medicare HMO

## 2018-12-12 ENCOUNTER — Inpatient Hospital Stay (HOSPITAL_BASED_OUTPATIENT_CLINIC_OR_DEPARTMENT_OTHER): Payer: Medicare HMO | Admitting: Oncology

## 2018-12-12 VITALS — BP 129/77 | HR 82 | Temp 97.0°F | Wt 185.0 lb

## 2018-12-12 DIAGNOSIS — Z95828 Presence of other vascular implants and grafts: Secondary | ICD-10-CM

## 2018-12-12 DIAGNOSIS — C3490 Malignant neoplasm of unspecified part of unspecified bronchus or lung: Secondary | ICD-10-CM

## 2018-12-12 DIAGNOSIS — C3491 Malignant neoplasm of unspecified part of right bronchus or lung: Secondary | ICD-10-CM | POA: Insufficient documentation

## 2018-12-12 DIAGNOSIS — I82C11 Acute embolism and thrombosis of right internal jugular vein: Secondary | ICD-10-CM | POA: Diagnosis not present

## 2018-12-12 DIAGNOSIS — Z5112 Encounter for antineoplastic immunotherapy: Secondary | ICD-10-CM | POA: Diagnosis present

## 2018-12-12 DIAGNOSIS — Z79899 Other long term (current) drug therapy: Secondary | ICD-10-CM | POA: Diagnosis not present

## 2018-12-12 DIAGNOSIS — Z87891 Personal history of nicotine dependence: Secondary | ICD-10-CM | POA: Insufficient documentation

## 2018-12-12 DIAGNOSIS — Z7901 Long term (current) use of anticoagulants: Secondary | ICD-10-CM

## 2018-12-12 DIAGNOSIS — R059 Cough, unspecified: Secondary | ICD-10-CM

## 2018-12-12 DIAGNOSIS — I82B11 Acute embolism and thrombosis of right subclavian vein: Secondary | ICD-10-CM

## 2018-12-12 DIAGNOSIS — R05 Cough: Secondary | ICD-10-CM | POA: Diagnosis not present

## 2018-12-12 DIAGNOSIS — I82C19 Acute embolism and thrombosis of unspecified internal jugular vein: Secondary | ICD-10-CM

## 2018-12-12 LAB — CBC WITH DIFFERENTIAL/PLATELET
Abs Immature Granulocytes: 0.01 10*3/uL (ref 0.00–0.07)
Basophils Absolute: 0 10*3/uL (ref 0.0–0.1)
Basophils Relative: 1 %
Eosinophils Absolute: 0.5 10*3/uL (ref 0.0–0.5)
Eosinophils Relative: 9 %
HCT: 36.2 % (ref 36.0–46.0)
Hemoglobin: 12.3 g/dL (ref 12.0–15.0)
Immature Granulocytes: 0 %
Lymphocytes Relative: 28 %
Lymphs Abs: 1.6 10*3/uL (ref 0.7–4.0)
MCH: 34.2 pg — ABNORMAL HIGH (ref 26.0–34.0)
MCHC: 34 g/dL (ref 30.0–36.0)
MCV: 100.6 fL — ABNORMAL HIGH (ref 80.0–100.0)
Monocytes Absolute: 0.4 10*3/uL (ref 0.1–1.0)
Monocytes Relative: 6 %
Neutro Abs: 3.2 10*3/uL (ref 1.7–7.7)
Neutrophils Relative %: 56 %
Platelets: 286 10*3/uL (ref 150–400)
RBC: 3.6 MIL/uL — ABNORMAL LOW (ref 3.87–5.11)
RDW: 12.3 % (ref 11.5–15.5)
WBC: 5.8 10*3/uL (ref 4.0–10.5)
nRBC: 0 % (ref 0.0–0.2)

## 2018-12-12 LAB — COMPREHENSIVE METABOLIC PANEL
ALT: 15 U/L (ref 0–44)
AST: 13 U/L — ABNORMAL LOW (ref 15–41)
Albumin: 3.9 g/dL (ref 3.5–5.0)
Alkaline Phosphatase: 94 U/L (ref 38–126)
Anion gap: 7 (ref 5–15)
BUN: 10 mg/dL (ref 8–23)
CO2: 23 mmol/L (ref 22–32)
Calcium: 8.7 mg/dL — ABNORMAL LOW (ref 8.9–10.3)
Chloride: 107 mmol/L (ref 98–111)
Creatinine, Ser: 0.54 mg/dL (ref 0.44–1.00)
GFR calc Af Amer: >60 mL/min (ref >60)
GFR calc non Af Amer: >60 mL/min (ref >60)
Glucose, Bld: 123 mg/dL — ABNORMAL HIGH (ref 70–99)
Potassium: 3.3 mmol/L — ABNORMAL LOW (ref 3.5–5.1)
Sodium: 137 mmol/L (ref 135–145)
Total Bilirubin: 0.6 mg/dL (ref 0.3–1.2)
Total Protein: 7.6 g/dL (ref 6.5–8.1)

## 2018-12-12 LAB — TSH: TSH: 1.609 u[IU]/mL (ref 0.350–4.500)

## 2018-12-12 MED ORDER — SODIUM CHLORIDE 0.9 % IV SOLN
Freq: Once | INTRAVENOUS | Status: AC
  Administered 2018-12-12: 10:00:00 via INTRAVENOUS
  Filled 2018-12-12: qty 250

## 2018-12-12 MED ORDER — SODIUM CHLORIDE 0.9% FLUSH
10.0000 mL | Freq: Once | INTRAVENOUS | Status: AC
  Administered 2018-12-12: 09:00:00 10 mL via INTRAVENOUS
  Filled 2018-12-12: qty 10

## 2018-12-12 MED ORDER — SODIUM CHLORIDE 0.9 % IV SOLN
10.0000 mg/kg | Freq: Once | INTRAVENOUS | Status: AC
  Administered 2018-12-12: 860 mg via INTRAVENOUS
  Filled 2018-12-12: qty 10

## 2018-12-12 MED ORDER — HEPARIN SOD (PORK) LOCK FLUSH 100 UNIT/ML IV SOLN
500.0000 [IU] | Freq: Once | INTRAVENOUS | Status: AC | PRN
  Administered 2018-12-12: 12:00:00 500 [IU]
  Filled 2018-12-12: qty 5

## 2018-12-12 NOTE — Progress Notes (Signed)
Patient here today for follow up and chemotherapy.  Patient states no new concerns today

## 2018-12-12 NOTE — Progress Notes (Signed)
Hematology/Oncology follow up note [facility] Telephone:(336) [phone] Fax:(336) [phone]   Patient Care Team: Sharyne Peach, MD as PCP - General (Family Medicine) Earlie Server, MD as Medical Oncologist (Medical Oncology) Telford Nab, RN as Registered Nurse  REASON FOR VISIT Follow-up for evaluation prior to immunotherapy for stage IIIA non-small cell lung cancer-adenocarcinoma   HISTORY OF PRESENTING ILLNESS:  Connie Powers is a  68 y.o.  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  CT lung cancer screen 05/27/2017   1. A few bilateral pulmonary nodules measuring up to 5 mm are indeterminate. Considering the suspicious 2.0 cm mediastinal adenopathy, follow up with PET/CT is recommended. 2. Mild bronchial wall thickening could represent airway disease including bronchitis.ACR Lung-RADS Category and Recommendation*: ACR Lung-RADS Category 2S (S modifier for mediastinal adenopathy)  PET scan 3/112019 1. No hypermetabolic pulmonary nodules.2. Hypermetabolic enlarged right paratracheal lymph node, of uncertain etiology in isolation. Lymphoproliferative disorder cannot be excluded. 3.  Aortic atherosclerosis (ICD10-170.0).  EBUS biopsy of paratracheal node showed non small cell lung cancer favoring adenocarcinoma. Case was discussed on tumor board on 09/16/2017 and consensus recommendation is to treat as Stage IIIA disease given the invasion of mediastinum.   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 MRI brain is contraindicated. CT brain negative for metastatic disease.  During the interval patient also was referred to vascular surgery for evaluation of malposition of Medi port.  Patient's right jugular port was removed and right internal jugular vein Mediport was placed by Dr.Dew on 8/5 2019.  # CT chest scan done 06/02/2018 which was independent reviewed by me. CT  chest showed radiation changes in the medial right upper lobe/paramediastinal region.  No evidence of recurrent or metastatic disease. Nonocclusive pericatheter thrombus/fibrin sheath along patient's right chest port. 06/06/2018 US venous upper right extremity ultrasound showed nonocclusive thrombus in the right jugular vein.  The port catheter can be seen associated with this thrombus.  The jugular vein is partially occlusive.  There is also partially occlusive thrombus in the right subclavian vein.  The right axillary, brachial, radial, and ulnar veins are compressible.  Doppler analysis demonstrated phasicity of the Doppler waveform. Patient was started on Eliquis for anticoagulation.   Current Treatment S/p concurrent Chemotherapy [name] /taxol  Weekly x 6] and RT.  Started on maintenance durvalumab every 2 weeks on 12/27/2017. 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 She has stopped smoking. Finish 1 year of Durvalumab [ 26 cycles] on 7/13/ 2020/   INTERVAL HISTORY Connie Powers is a 68 y.o. female who has above history reviewed by me today presents for assessment prior to immunotherapy for stage III A non-small cell lung cancer-adenocarcinoma Reports doing well, she has no new concerns.  Denies any SOB, chest pain, diarrhea or skin rash.  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  No bleeding events.  Chronic cough at baseline. She feels " wearing mask triggers my cough".    Review of Systems  Constitutional: Negative for appetite change, chills, fatigue and fever.  HENT:   Negative for hearing loss and voice change.   Eyes: Negative for eye problems.  Respiratory: Negative for chest tightness and cough.        Chronic cough  Cardiovascular: Negative for chest pain.  Gastrointestinal: Negative for abdominal distention, abdominal pain and blood in stool.  Endocrine: Negative for hot flashes.   Genitourinary: Negative for difficulty urinating and frequency.   Musculoskeletal: Negative for arthralgias.  Skin: Negative for itching and rash.  Neurological: Negative for extremity weakness.  Hematological: Negative for adenopathy.  Psychiatric/Behavioral: Negative for confusion.    MEDICAL HISTORY:  Past Medical History:  Diagnosis Date  . Brain aneurysm   . Difficult intubation   . Hypertension   . Non-small cell cancer of right lung ([place]) 09/22/2017   Chemo + Rad tx's.   . Viral meningitis     SURGICAL HISTORY: Past Surgical History:  Procedure Laterality Date  . ENDOBRONCHIAL ULTRASOUND N/A 09/09/2017   Procedure: ENDOBRONCHIAL ULTRASOUND;  Surgeon: Laverle Hobby, MD;  Location: [facility] ORS;  Service: Pulmonary;  Laterality: N/A;  . OOPHORECTOMY Left   . PORTA CATH INSERTION N/A 09/29/2017   Procedure: PORTA CATH INSERTION;  Surgeon: Algernon Huxley, MD;  Location: [facility];  Service: Cardiovascular;  Laterality: N/A;  . PORTA CATH INSERTION N/A 01/03/2018   Procedure: PORTA CATH INSERTION;  Surgeon: Algernon Huxley, MD;  Location: [place] [facility];  Service: Cardiovascular;  Laterality: N/A;  . surgical repair for brain tumor  1969   clips in head  . WISDOM TOOTH EXTRACTION      SOCIAL HISTORY: Social History   Socioeconomic History  . Marital status: Married    Spouse name: Not on file  . Number of children: Not on file  . Years of education: Not on file  . Highest education level: Not on file  Occupational History  . Not on file  Social Needs  . Financial resource strain: Not on file  . Food insecurity    Worry: Not on file    Inability: Not on file  . Transportation needs    Medical: Not on file    Non-medical: Not on file  Tobacco Use  . Smoking status: Former Smoker    Packs/day: 0.25    Quit date: 10/03/2017    Years since quitting: 1.1  . Smokeless tobacco: Never Used  Substance and Sexual Activity  . Alcohol use: Not Currently     Comment: social  . Drug use: Not Currently    Types: Cocaine  . Sexual activity: Not on file  Lifestyle  . Physical activity    Days per week: Not on file    Minutes per session: Not on file  . Stress: Not on file  Relationships  . Social Herbalist on phone: Not on file    Gets together: Not on file    Attends religious service: Not on file    Active member of club or organization: Not on file    Attends meetings of clubs or organizations: Not on file    Relationship status: Not on file  . Intimate partner violence    Fear of current or ex partner: Not on file    Emotionally abused: Not on file    Physically abused: Not on file    Forced sexual activity: Not on file  Other Topics Concern  . Not on file  Social History Narrative  . Not on file    FAMILY HISTORY: Family History  Problem Relation Age of Onset  . Diabetes Mother   . Hypertension Mother   . Hyperlipidemia Mother   . Arthritis Mother   . Hypertension Father   . Hyperlipidemia Father   . Heart attack Father        x2  .  Breast cancer Neg Hx     ALLERGIES:  has No Known Allergies.  MEDICATIONS:  Current Outpatient Medications  Medication Sig Dispense Refill  . amLODipine (NORVASC) 10 MG tablet Take 10 mg by mouth daily.  11  . apixaban (ELIQUIS) 5 MG TABS tablet Take 1 tablet (5 mg total) by mouth 2 (two) times daily. 60 tablet 0  . Cyanocobalamin (B-12) 1000 MCG CAPS Take 1,000 mcg by mouth daily. 30 capsule 3  . docusate sodium (COLACE) 100 MG capsule Take 100 mg by mouth daily.    Marland Kitchen lidocaine-prilocaine (EMLA) cream Apply to affected area once 30 g 3  . ondansetron (ZOFRAN) 8 MG tablet Take 1 tablet (8 mg total) by mouth 2 (two) times daily as needed for refractory nausea / vomiting. Start on day 3 after chemo. 30 tablet 1  . potassium chloride SA (K-DUR) 20 MEQ tablet Take 1 tablet (20 mEq total) by mouth daily. 3 tablet 0  . prochlorperazine (COMPAZINE) 10 MG tablet Take 1 tablet (10 mg  total) by mouth every 6 (six) hours as needed (Nausea or vomiting). 30 tablet 1  . umeclidinium-vilanterol (ANORO ELLIPTA) 62.5-25 MCG/INH AEPB Inhale 1 puff into the lungs daily as needed (shortness of breath).      No current facility-administered medications for this visit.    Facility-Administered Medications Ordered in Other Visits  Medication Dose Route Frequency Provider Last Rate Last Dose  . heparin lock flush 100 unit/mL  500 Units Intracatheter Once PRN Earlie Server, MD         PHYSICAL EXAMINATION: ECOG PERFORMANCE STATUS: 0 - Asymptomatic Vitals:   12/12/18 0929  BP: 129/77  Pulse: 82  Temp: (!) 97 F (36.1 C)   Filed Weights   12/12/18 0929  Weight: 185 lb (83.9 kg)    Physical Exam Constitutional:      General: She is not in acute distress. HENT:     Head: Normocephalic and atraumatic.  Eyes:     General: No scleral icterus.       Left eye: No discharge.     Conjunctiva/sclera: Conjunctivae normal.     Pupils: Pupils are equal, round, and reactive to light.  Neck:     Musculoskeletal: Normal range of motion and neck supple.     Comments: No neck swelling Cardiovascular:     Rate and Rhythm: Normal rate and regular rhythm.     Heart sounds: Normal heart sounds. No murmur.  Pulmonary:     Effort: Pulmonary effort is normal. No respiratory distress.     Breath sounds: Normal breath sounds. No wheezing or rales.  Chest:     Chest wall: No tenderness.  Abdominal:     General: Bowel sounds are normal. There is no distension.     Palpations: Abdomen is soft. There is no mass.     Tenderness: There is no abdominal tenderness. There is no guarding.  Musculoskeletal: Normal range of motion.        General: No deformity.  Lymphadenopathy:     Cervical: No cervical adenopathy.  Skin:    General: Skin is warm and dry.     Findings: No erythema or rash.     Comments: Right anterior chest wall + Mediport, no surrounding swelling or erythema.  Neurological:      Mental Status: She is alert and oriented to person, place, and time.     Cranial Nerves: No cranial nerve deficit.     Coordination: Coordination normal.  Psychiatric:  Behavior: Behavior normal.        Thought Content: Thought content normal.      LABORATORY DATA:  I have reviewed the data as listed Lab Results  Component Value Date   WBC 5.8 12/12/2018   HGB 12.3 12/12/2018   HCT 36.2 12/12/2018   MCV 100.6 (H) 12/12/2018   PLT 286 12/12/2018   Recent Labs    11/14/18 0836 11/28/18 0842 12/12/18 0909  NA 140 138 137  K 3.6 3.4* 3.3*  CL 108 105 107  CO2 24 26 23   GLUCOSE 128* 102* 123*  BUN 6* 9 10  CREATININE 0.61 0.48 0.54  CALCIUM 8.7* 8.7* 8.7*  GFRNONAA >60 >60 >60  GFRAA >60 >60 >60  PROT 7.2 7.1 7.6  ALBUMIN 3.9 3.9 3.9  AST 22 17 13*  ALT 19 19 15   ALKPHOS 114 99 94  BILITOT 0.3 0.4 0.6   RADIOGRAPHIC STUDIES: I have personally reviewed the radiological images as listed and agreed with the findings in the report. 12/20/2017 Repeat CT scan showed interval decreased of disease. CT images were Independently reviewed by me and discussed with patient. New 33mm right lung lesion, indeterminate.  02/25/2018 CT chest with contrast, presumed evolutionary changes of radiation therapy in the upper right hemithorax.  Aortic atherosclerosis, emphysema.  ASSESSMENT & PLAN:  Cancer Staging Non-small cell lung cancer ([place]) Staging form: Lung, AJCC 8th Edition - Clinical stage from 09/22/2017: Stage Unknown (cTX, cN1, cM0) - Signed by Earlie Server, MD on 09/22/2017 - Pathologic: No stage assigned - Unsigned  1. Non-small cell cancer of right lung ([place])   2. Encounter for antineoplastic immunotherapy   3. Port-A-Cath in place   4. Chronic anticoagulation   5. Acute embolism and thrombosis of internal jugular vein, unspecified laterality ([facility])    #Clinical stage IIIA lung NSCLC, favoring adenocarcinoma Clinically tolerating well immunotherapy very well. She will  finish 1 year of durvalumab maintenance today. Labs are reviewed and discussed with patient. Counts are acceptable to proceed with durvalumab treatment today. TSH has been followed which is stable.  #We discussed about repeating surveillance CT scan of July 2020.  Going forward, she will continue to have CT surveillance Q3 months.  # Plan to discontinue Medi port after CT scan.  And she can discontinue anticoagulation after port is removed.  Continue Eliquis 5mg  BID for now.    RTC after CT scan to discuss results.   Earlie Server, MD, PhD

## 2018-12-23 ENCOUNTER — Ambulatory Visit: Admission: RE | Admit: 2018-12-23 | Payer: Medicare HMO | Source: Ambulatory Visit

## 2018-12-23 ENCOUNTER — Other Ambulatory Visit: Payer: Self-pay

## 2018-12-23 ENCOUNTER — Ambulatory Visit
Admission: RE | Admit: 2018-12-23 | Discharge: 2018-12-23 | Disposition: A | Discharge status: Home / Self Care | Payer: Medicare HMO | Source: Ambulatory Visit | Attending: Oncology | Admitting: Oncology

## 2018-12-23 DIAGNOSIS — C3491 Malignant neoplasm of unspecified part of right bronchus or lung: Secondary | ICD-10-CM | POA: Insufficient documentation

## 2018-12-26 ENCOUNTER — Encounter: Payer: Self-pay | Admitting: Oncology

## 2018-12-26 ENCOUNTER — Telehealth (INDEPENDENT_AMBULATORY_CARE_PROVIDER_SITE_OTHER): Payer: Self-pay

## 2018-12-26 ENCOUNTER — Other Ambulatory Visit: Payer: Self-pay

## 2018-12-26 ENCOUNTER — Inpatient Hospital Stay (HOSPITAL_BASED_OUTPATIENT_CLINIC_OR_DEPARTMENT_OTHER): Payer: Medicare HMO | Admitting: Oncology

## 2018-12-26 VITALS — BP 120/77 | HR 73 | Temp 97.7°F | Resp 18 | Wt 185.2 lb

## 2018-12-26 DIAGNOSIS — I8290 Acute embolism and thrombosis of unspecified vein: Secondary | ICD-10-CM

## 2018-12-26 DIAGNOSIS — Z7901 Long term (current) use of anticoagulants: Secondary | ICD-10-CM

## 2018-12-26 DIAGNOSIS — R911 Solitary pulmonary nodule: Secondary | ICD-10-CM

## 2018-12-26 DIAGNOSIS — Z5112 Encounter for antineoplastic immunotherapy: Secondary | ICD-10-CM | POA: Diagnosis not present

## 2018-12-26 DIAGNOSIS — Z95828 Presence of other vascular implants and grafts: Secondary | ICD-10-CM

## 2018-12-26 DIAGNOSIS — I82C11 Acute embolism and thrombosis of right internal jugular vein: Secondary | ICD-10-CM

## 2018-12-26 DIAGNOSIS — I82B11 Acute embolism and thrombosis of right subclavian vein: Secondary | ICD-10-CM

## 2018-12-26 DIAGNOSIS — R05 Cough: Secondary | ICD-10-CM

## 2018-12-26 DIAGNOSIS — C3491 Malignant neoplasm of unspecified part of right bronchus or lung: Secondary | ICD-10-CM | POA: Diagnosis not present

## 2018-12-26 DIAGNOSIS — Z87891 Personal history of nicotine dependence: Secondary | ICD-10-CM

## 2018-12-26 NOTE — Progress Notes (Signed)
Hematology/Oncology follow up note [facility] Telephone:(336) [phone] Fax:(336) [phone]   Patient Care Team: Sharyne Peach, MD as PCP - General (Family Medicine) Earlie Server, MD as Medical Oncologist (Medical Oncology) Telford Nab, RN as Registered Nurse  REASON FOR VISIT Follow-up for evaluation prior to immunotherapy for stage IIIA non-small cell lung cancer-adenocarcinoma   HISTORY OF PRESENTING ILLNESS:  Connie Powers is a  68 y.o.  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  CT lung cancer screen 05/27/2017   1. A few bilateral pulmonary nodules measuring up to 5 mm are indeterminate. Considering the suspicious 2.0 cm mediastinal adenopathy, follow up with PET/CT is recommended. 2. Mild bronchial wall thickening could represent airway disease including bronchitis.ACR Lung-RADS Category and Recommendation*: ACR Lung-RADS Category 2S (S modifier for mediastinal adenopathy)  PET scan 3/112019 1. No hypermetabolic pulmonary nodules.2. Hypermetabolic enlarged right paratracheal lymph node, of uncertain etiology in isolation. Lymphoproliferative disorder cannot be excluded. 3.  Aortic atherosclerosis (ICD10-170.0).  EBUS biopsy of paratracheal node showed non small cell lung cancer favoring adenocarcinoma. Case was discussed on tumor board on 09/16/2017 and consensus recommendation is to treat as Stage IIIA disease given the invasion of mediastinum.   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 MRI brain is contraindicated. CT brain negative for metastatic disease.  During the interval patient also was referred to vascular surgery for evaluation of malposition of Medi port.  Patient's right jugular port was removed and right internal jugular vein Mediport was placed by Dr.Dew on 8/5 2019.  # CT chest scan done 06/02/2018 which was independent reviewed by me. CT  chest showed radiation changes in the medial right upper lobe/paramediastinal region.  No evidence of recurrent or metastatic disease. Nonocclusive pericatheter thrombus/fibrin sheath along patient's right chest port. 06/06/2018 US venous upper right extremity ultrasound showed nonocclusive thrombus in the right jugular vein.  The port catheter can be seen associated with this thrombus.  The jugular vein is partially occlusive.  There is also partially occlusive thrombus in the right subclavian vein.  The right axillary, brachial, radial, and ulnar veins are compressible.  Doppler analysis demonstrated phasicity of the Doppler waveform. Patient was started on Eliquis for anticoagulation.   Current Treatment S/p concurrent Chemotherapy [name] /taxol  Weekly x 6] and RT.  Started on maintenance durvalumab every 2 weeks on 12/27/2017. 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 She has stopped smoking. Finish 1 year of Durvalumab [ 26 cycles] on 7/13/ 2020/   INTERVAL HISTORY Connie Powers is a 68 y.o. female who has above history reviewed by me today presents to discuss image results. She has finished 1 year of durvalumab treatments.  Doing well. She has no new concerns. 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 No bleeding events.  Chronic cough at baseline. Denies any pain today.   Review of Systems  Constitutional: Negative for appetite change, chills, fatigue and fever.  HENT:   Negative for hearing loss and voice change.   Eyes: Negative for eye problems.  Respiratory: Negative for chest tightness and cough.        Chronic cough  Cardiovascular: Negative for chest pain.  Gastrointestinal: Negative for abdominal distention, abdominal pain and blood in stool.  Endocrine: Negative for hot flashes.  Genitourinary: Negative for difficulty urinating and frequency.  Musculoskeletal: Negative for  arthralgias.  Skin: Negative for itching and rash.  Neurological: Negative for extremity weakness.  Hematological: Negative for adenopathy.  Psychiatric/Behavioral: Negative for confusion.    MEDICAL HISTORY:  Past Medical History:  Diagnosis Date  . Brain aneurysm   . Difficult intubation   . Hypertension   . Non-small cell cancer of right lung ([place]) 09/22/2017   Chemo + Rad tx's.   . Viral meningitis     SURGICAL HISTORY: Past Surgical History:  Procedure Laterality Date  . ENDOBRONCHIAL ULTRASOUND N/A 09/09/2017   Procedure: ENDOBRONCHIAL ULTRASOUND;  Surgeon: Laverle Hobby, MD;  Location: [facility];  Service: Pulmonary;  Laterality: N/A;  . OOPHORECTOMY Left   . PORTA CATH INSERTION N/A 09/29/2017   Procedure: PORTA CATH INSERTION;  Surgeon: Algernon Huxley, MD;  Location: [place] [facility];  Service: Cardiovascular;  Laterality: N/A;  . PORTA CATH INSERTION N/A 01/03/2018   Procedure: PORTA CATH INSERTION;  Surgeon: Algernon Huxley, MD;  Location: [facility];  Service: Cardiovascular;  Laterality: N/A;  . surgical repair for brain tumor  1969   clips in head  . WISDOM TOOTH EXTRACTION      SOCIAL HISTORY: Social History   Socioeconomic History  . Marital status: Married    Spouse name: Not on file  . Number of children: Not on file  . Years of education: Not on file  . Highest education level: Not on file  Occupational History  . Not on file  Social Needs  . Financial resource strain: Not on file  . Food insecurity    Worry: Not on file    Inability: Not on file  . Transportation needs    Medical: Not on file    Non-medical: Not on file  Tobacco Use  . Smoking status: Former Smoker    Packs/day: 0.25    Quit date: 10/03/2017    Years since quitting: 1.2  . Smokeless tobacco: Never Used  Substance and Sexual Activity  . Alcohol use: Not Currently    Comment: social  . Drug use: Not Currently    Types: Cocaine  . Sexual activity: Not on file   Lifestyle  . Physical activity    Days per week: Not on file    Minutes per session: Not on file  . Stress: Not on file  Relationships  . Social Herbalist on phone: Not on file    Gets together: Not on file    Attends religious service: Not on file    Active member of club or organization: Not on file    Attends meetings of clubs or organizations: Not on file    Relationship status: Not on file  . Intimate partner violence    Fear of current or ex partner: Not on file    Emotionally abused: Not on file    Physically abused: Not on file    Forced sexual activity: Not on file  Other Topics Concern  . Not on file  Social History Narrative  . Not on file    FAMILY HISTORY: Family History  Problem Relation Age of Onset  . Diabetes Mother   . Hypertension Mother   . Hyperlipidemia Mother   . Arthritis Mother   . Hypertension Father   . Hyperlipidemia Father   . Heart attack Father        x2  . Breast cancer Neg Hx     ALLERGIES:  has No Known Allergies.  MEDICATIONS:  Current Outpatient Medications  Medication Sig Dispense Refill  . amLODipine (NORVASC) 10 MG tablet Take 10 mg by mouth daily.  11  . apixaban (ELIQUIS) 5 MG TABS tablet Take 1 tablet (5 mg total) by mouth 2 (two) times daily. 60 tablet 0  . Cyanocobalamin (B-12) 1000 MCG CAPS Take 1,000 mcg by mouth daily. 30 capsule 3  . docusate sodium (COLACE) 100 MG capsule Take 100 mg by mouth daily.    Marland Kitchen lidocaine-prilocaine (EMLA) cream Apply to affected area once 30 g 3  . ondansetron (ZOFRAN) 8 MG tablet Take 1 tablet (8 mg total) by mouth 2 (two) times daily as needed for refractory nausea / vomiting. Start on day 3 after chemo. 30 tablet 1  . prochlorperazine (COMPAZINE) 10 MG tablet Take 1 tablet (10 mg total) by mouth every 6 (six) hours as needed (Nausea or vomiting). 30 tablet 1  . umeclidinium-vilanterol (ANORO ELLIPTA) 62.5-25 MCG/INH AEPB Inhale 1 puff into the lungs daily as needed (shortness  of breath).     . potassium chloride SA (K-DUR) 20 MEQ tablet Take 1 tablet (20 mEq total) by mouth daily. (Patient not taking: Reported on 12/26/2018) 3 tablet 0   No current facility-administered medications for this visit.      PHYSICAL EXAMINATION: ECOG PERFORMANCE STATUS: 0 - Asymptomatic Vitals:   12/26/18 1024  BP: 120/77  Pulse: 73  Resp: 18  Temp: 97.7 F (36.5 C)   Filed Weights   12/26/18 1024  Weight: 185 lb 3.2 oz (84 kg)    Physical Exam Constitutional:      General: She is not in acute distress. HENT:     Head: Normocephalic and atraumatic.  Eyes:     General: No scleral icterus.       Left eye: No discharge.     Conjunctiva/sclera: Conjunctivae normal.     Pupils: Pupils are equal, round, and reactive to light.  Neck:     Musculoskeletal: Normal range of motion and neck supple.     Comments: No neck swelling Cardiovascular:     Rate and Rhythm: Normal rate and regular rhythm.     Heart sounds: Normal heart sounds. No murmur.  Pulmonary:     Effort: Pulmonary effort is normal. No respiratory distress.     Breath sounds: Normal breath sounds. No wheezing or rales.  Chest:     Chest wall: No tenderness.  Abdominal:     General: Bowel sounds are normal. There is no distension.     Palpations: Abdomen is soft. There is no mass.     Tenderness: There is no abdominal tenderness. There is no guarding.  Musculoskeletal: Normal range of motion.        General: No deformity.  Lymphadenopathy:     Cervical: No cervical adenopathy.  Skin:    General: Skin is warm and dry.     Findings: No erythema or rash.     Comments: Right anterior chest wall + Mediport, no surrounding swelling or erythema.  Neurological:     Mental Status: She is alert and oriented to person, place, and time.     Cranial Nerves: No cranial nerve deficit.     Coordination: Coordination normal.  Psychiatric:        Behavior: Behavior normal.        Thought Content: Thought content  normal.      LABORATORY DATA:  I have reviewed the data as listed Lab Results  Component Value Date  WBC 5.8 12/12/2018   HGB 12.3 12/12/2018   HCT 36.2 12/12/2018   MCV 100.6 (H) 12/12/2018   PLT 286 12/12/2018   Recent Labs    11/14/18 0836 11/28/18 0842 12/12/18 0909  NA 140 138 137  K 3.6 3.4* 3.3*  CL 108 105 107  CO2 24 26 23   GLUCOSE 128* 102* 123*  BUN 6* 9 10  CREATININE 0.61 0.48 0.54  CALCIUM 8.7* 8.7* 8.7*  GFRNONAA >60 >60 >60  GFRAA >60 >60 >60  PROT 7.2 7.1 7.6  ALBUMIN 3.9 3.9 3.9  AST 22 17 13*  ALT 19 19 15   ALKPHOS 114 99 94  BILITOT 0.3 0.4 0.6   RADIOGRAPHIC STUDIES: I have personally reviewed the radiological images as listed and agreed with the findings in the report. 12/20/2017 Repeat CT scan showed interval decreased of disease. CT images were Independently reviewed by me and discussed with patient. New 24mm right lung lesion, indeterminate.  02/25/2018 CT chest with contrast, presumed evolutionary changes of radiation therapy in the upper right hemithorax.  Aortic atherosclerosis, emphysema.  ASSESSMENT & PLAN:  Cancer Staging Non-small cell lung cancer ([place]) Staging form: Lung, AJCC 8th Edition - Clinical stage from 09/22/2017: Stage Unknown (cTX, cN1, cM0) - Signed by Earlie Server, MD on 09/22/2017 - Pathologic: No stage assigned - Unsigned  1. Non-small cell cancer of right lung ([place])   2. Port-A-Cath in place   3. Thrombosis   4. Chronic anticoagulation    #Clinical stage IIIA lung NSCLC, favoring adenocarcinoma Clinically doing well. CT images were independently reviewed by me and discussed with patient. Stable exam.  No new or progressive interval findings.  Stable appearance of radiation scarring medial right lung. No change of the 6 mm left lower lobe pulmonary nodule.  Discussed about doing Mediport due to catheter induced thrombosis. Will refer patient back to Dr. Lucky Cowboy for port removal. Recommend patient to continue take Eliquis  5 mg twice daily for for 2 weeks then stop She voices understanding.  RTC 3 months after CT surveillance scan.  Earlie Server, MD, PhD

## 2018-12-26 NOTE — Progress Notes (Signed)
Patient does not offer any problems today. Here to discuss  Results of chest CT performed 12/23/18

## 2018-12-26 NOTE — Telephone Encounter (Signed)
Spoke with the patient and she is scheduled with Dr. Lucky Cowboy on 8/43/2020 with a 6:45 am arrival time. Patient will do Covid testing on 12/29/2018 between 12:30-2:30 pm. Patient was given all information and understood information.

## 2018-12-27 ENCOUNTER — Other Ambulatory Visit (INDEPENDENT_AMBULATORY_CARE_PROVIDER_SITE_OTHER): Payer: Self-pay | Admitting: Nurse Practitioner

## 2018-12-29 ENCOUNTER — Other Ambulatory Visit
Admission: RE | Admit: 2018-12-29 | Discharge: 2018-12-29 | Disposition: A | Discharge status: Home / Self Care | Payer: Medicare HMO | Source: Ambulatory Visit | Attending: Vascular Surgery | Admitting: Vascular Surgery

## 2018-12-29 ENCOUNTER — Other Ambulatory Visit: Payer: Self-pay

## 2018-12-29 DIAGNOSIS — Z20828 Contact with and (suspected) exposure to other viral communicable diseases: Secondary | ICD-10-CM | POA: Insufficient documentation

## 2018-12-30 LAB — SARS CORONAVIRUS 2 (TAT 6-24 HRS): SARS Coronavirus 2: NEGATIVE

## 2019-01-01 MED ORDER — CEFAZOLIN SODIUM-DEXTROSE 2-4 GM/100ML-% IV SOLN
2.0000 g | Freq: Once | INTRAVENOUS | Status: AC
  Administered 2019-01-02: 2 g via INTRAVENOUS

## 2019-01-02 ENCOUNTER — Encounter
Admission: RE | Disposition: A | Discharge status: Home / Self Care | Payer: Self-pay | Source: Home / Self Care | Attending: Vascular Surgery

## 2019-01-02 ENCOUNTER — Other Ambulatory Visit: Payer: Self-pay

## 2019-01-02 ENCOUNTER — Ambulatory Visit
Admission: RE | Admit: 2019-01-02 | Discharge: 2019-01-02 | Disposition: A | Discharge status: Home / Self Care | Payer: Medicare HMO | Attending: Vascular Surgery | Admitting: Vascular Surgery

## 2019-01-02 DIAGNOSIS — C3491 Malignant neoplasm of unspecified part of right bronchus or lung: Secondary | ICD-10-CM | POA: Diagnosis not present

## 2019-01-02 DIAGNOSIS — Z7901 Long term (current) use of anticoagulants: Secondary | ICD-10-CM | POA: Insufficient documentation

## 2019-01-02 DIAGNOSIS — Z452 Encounter for adjustment and management of vascular access device: Secondary | ICD-10-CM | POA: Diagnosis present

## 2019-01-02 DIAGNOSIS — Z87891 Personal history of nicotine dependence: Secondary | ICD-10-CM | POA: Insufficient documentation

## 2019-01-02 DIAGNOSIS — C3490 Malignant neoplasm of unspecified part of unspecified bronchus or lung: Secondary | ICD-10-CM

## 2019-01-02 DIAGNOSIS — I671 Cerebral aneurysm, nonruptured: Secondary | ICD-10-CM | POA: Insufficient documentation

## 2019-01-02 DIAGNOSIS — I10 Essential (primary) hypertension: Secondary | ICD-10-CM | POA: Insufficient documentation

## 2019-01-02 DIAGNOSIS — I8290 Acute embolism and thrombosis of unspecified vein: Secondary | ICD-10-CM | POA: Insufficient documentation

## 2019-01-02 DIAGNOSIS — Z8249 Family history of ischemic heart disease and other diseases of the circulatory system: Secondary | ICD-10-CM | POA: Insufficient documentation

## 2019-01-02 DIAGNOSIS — Z79899 Other long term (current) drug therapy: Secondary | ICD-10-CM | POA: Insufficient documentation

## 2019-01-02 DIAGNOSIS — Z90721 Acquired absence of ovaries, unilateral: Secondary | ICD-10-CM | POA: Diagnosis not present

## 2019-01-02 HISTORY — PX: PORTA CATH REMOVAL: CATH118286

## 2019-01-02 SURGERY — PORTA CATH REMOVAL
Anesthesia: Moderate Sedation

## 2019-01-02 MED ORDER — METHYLPREDNISOLONE SODIUM SUCC 125 MG IJ SOLR: 125.0000 mg | Freq: Once | INTRAMUSCULAR | Status: DC | PRN

## 2019-01-02 MED ORDER — MIDAZOLAM HCL 2 MG/ML PO SYRP: 8.0000 mg | ORAL_SOLUTION | Freq: Once | ORAL | Status: DC | PRN

## 2019-01-02 MED ORDER — HYDROMORPHONE HCL 1 MG/ML IJ SOLN: 1.0000 mg | Freq: Once | INTRAMUSCULAR | Status: DC | PRN

## 2019-01-02 MED ORDER — MIDAZOLAM HCL 5 MG/5ML IJ SOLN
INTRAMUSCULAR | Status: AC
  Filled 2019-01-02: qty 5

## 2019-01-02 MED ORDER — MIDAZOLAM HCL 2 MG/2ML IJ SOLN
INTRAMUSCULAR | Status: DC | PRN
  Administered 2019-01-02: 2 mg via INTRAVENOUS

## 2019-01-02 MED ORDER — FENTANYL CITRATE (PF) 100 MCG/2ML IJ SOLN
INTRAMUSCULAR | Status: AC
  Filled 2019-01-02: qty 2

## 2019-01-02 MED ORDER — FENTANYL CITRATE (PF) 100 MCG/2ML IJ SOLN
INTRAMUSCULAR | Status: DC | PRN
  Administered 2019-01-02: 50 ug via INTRAVENOUS

## 2019-01-02 MED ORDER — FAMOTIDINE 20 MG PO TABS: 40.0000 mg | ORAL_TABLET | Freq: Once | ORAL | Status: DC | PRN

## 2019-01-02 MED ORDER — ONDANSETRON HCL 4 MG/2ML IJ SOLN: 4.0000 mg | Freq: Four times a day (QID) | INTRAMUSCULAR | Status: DC | PRN

## 2019-01-02 MED ORDER — SODIUM CHLORIDE 0.9 % IV SOLN
INTRAVENOUS | Status: DC
  Administered 2019-01-02: 07:00:00 via INTRAVENOUS

## 2019-01-02 SURGICAL SUPPLY — 9 items
DERMABOND ADVANCED (GAUZE/BANDAGES/DRESSINGS) ×2
DERMABOND ADVANCED .7 DNX12 (GAUZE/BANDAGES/DRESSINGS) IMPLANT
PACK ANGIOGRAPHY (CUSTOM PROCEDURE TRAY) ×2 IMPLANT
PAD GROUND ADULT SPLIT (MISCELLANEOUS) ×2 IMPLANT
PENCIL ELECTRO HAND CTR (MISCELLANEOUS) ×2 IMPLANT
SPONGE XRAY 4X4 16PLY STRL (MISCELLANEOUS) ×2 IMPLANT
SUT MNCRL AB 4-0 PS2 18 (SUTURE) ×2 IMPLANT
SUT VIC AB 3-0 SH 27 (SUTURE) ×2
SUT VIC AB 3-0 SH 27X BRD (SUTURE) IMPLANT

## 2019-01-02 NOTE — Op Note (Signed)
[facility]       Operative Note  Date: 01/02/2019  Preoperative diagnosis:  1. Lung cancer, no longer using port  Postoperative diagnosis:  Same as above  Procedures: #1. Removal of right jugular port a cath   Surgeon: Leotis Pain, MD  Anesthesia: Local with moderate conscious sedation for 15 minutes using 2 mg of Versed and 50 mcg of Fentanyl  Fluoroscopy time: none  Contrast used: 0  Estimated blood loss: 3 cc  Indication for the procedure:  The patient is a 68 y.o. female who has completed therapy for lung cancer and no longer needs their Port-A-Cath. The patient desires to have this removed. Risks and benefits including need for potential replacement with recurrent disease were discussed and patient is agreeable to proceed.  Description of procedure: The patient was brought to the vascular and interventional radiology suite. Moderate conscious sedation was administered during a face to face encounter with the patient throughout the procedure with my supervision of the RN administering medicines and monitoring the patient's vital signs, pulse oximetry, telemetry and mental status throughout from the start of the procedure until the patient was taken to the recovery room.  The right neck chest and shoulder were sterilely prepped and draped, and a sterile surgical field was created. The area was then anesthetized with 1% lidocaine copiously. The previous incision was reopened and electrocautery used to dissected down to the port and the catheter. These were dissected free and the catheter was gently removed from the vein in its entirety. The port was dissected out from the fibrous connective tissue and the Prolene sutures were removed. The port was then removed in its entirety including the catheter. The wound was then closed with a 3-0 Vicryl and a 4-0 Monocryl and Dermabond was placed as a dressing. The patient was then taken  to the recovery room in stable condition having tolerated the procedure well.  Complications: none  Condition: stable   Leotis Pain, MD 01/02/2019 8:46 AM   This note was created with Dragon Medical transcription system. Any errors in dictation are purely unintentional.

## 2019-01-02 NOTE — Discharge Instructions (Signed)
Moderate Conscious Sedation, Adult, Care After These instructions provide you with information about caring for yourself after your procedure. Your health care provider may also give you more specific instructions. Your treatment has been planned according to current medical practices, but problems sometimes occur. Call your health care provider if you have any problems or questions after your procedure. What can I expect after the procedure? After your procedure, it is common:  To feel sleepy for several hours.  To feel clumsy and have poor balance for several hours.  To have poor judgment for several hours.  To vomit if you eat too soon. Follow these instructions at home: For at least 24 hours after the procedure:   Do not: ? Participate in activities where you could fall or become injured. ? Drive. ? Use heavy machinery. ? Drink alcohol. ? Take sleeping pills or medicines that cause drowsiness. ? Make important decisions or sign legal documents. ? Take care of children on your own.  Rest. Eating and drinking  Follow the diet recommended by your health care provider.  If you vomit: ? Drink water, juice, or soup when you can drink without vomiting. ? Make sure you have little or no nausea before eating solid foods. General instructions  Have a responsible adult stay with you until you are awake and alert.  Take over-the-counter and prescription medicines only as told by your health care provider.  If you smoke, do not smoke without supervision.  Keep all follow-up visits as told by your health care provider. This is important. Contact a health care provider if:  You keep feeling nauseous or you keep vomiting.  You feel light-headed.  You develop a rash.  You have a fever. Get help right away if:  You have trouble breathing. This information is not intended to replace advice given to you by your health care provider. Make sure you discuss any questions you have  with your health care provider. Document Released: 03/08/2013 Document Revised: 04/30/2017 Document Reviewed: 09/07/2015 Elsevier Patient Education  2020 [place] Removal  Implanted port removal is a procedure to remove the port and catheter that are implanted under your skin. The port is a small disc under your skin that can be punctured with a needle. It is connected to a vein in your chest or neck by a small flexible tube (catheter). The implanted port is used to give medicines for treatments, and it may also be used to take blood samples. Your health care provider will remove the implanted port if:  You no longer need it for treatment.  It is not working properly.  The area around it gets infected. Tell a health care provider about:  Any allergies you have.  All medicines you are taking, including vitamins, herbs, eye drops, creams, and over-the-counter medicines.  Any problems you or family members have had with anesthetic medicines.  Any blood disorders you have.  Any surgeries you have had.  Any medical conditions you have.  Whether you are pregnant or may be pregnant. What are the risks? Generally, this is a safe procedure. However, problems may occur, including:  Infection.  Bleeding.  Allergic reactions to anesthetic medicines.  Damage to nerves or blood vessels. What happens before the procedure? Medicines  Ask your health care provider about: ? Changing or stopping your regular medicines. This is especially important if you are taking diabetes medicines or blood thinners. ? Taking medicines such as aspirin and ibuprofen. These medicines can thin your  blood. Do not take these medicines unless your health care provider tells you to take them. ? Taking over-the-counter medicines, vitamins, herbs, and supplements. General instructions  You will have: ? A physical exam. ? Blood tests. ? Imaging tests, including a chest X-ray.  Follow  instructions from your health care provider about eating or drinking restrictions.  Ask your health care provider how your surgical site will be marked or identified.  Ask your health care provider what steps will be taken to help prevent infection. These may include: ? Removing hair at the surgery site. ? Washing skin with a germ-killing soap. ? Antibiotic medicine.  Plan to have someone take you home from the hospital or clinic.  If you will be going home right after the procedure, plan to have a responsible adult care for you for at least 24 hours after you leave the hospital or clinic. This is important. What happens during the procedure?  You may be given one or more of the following: ? A medicine to help you relax (sedative). ? A medicine to numb the area (local anesthetic).  A small incision will be made at the site of your implanted port.  The implanted port and the catheter that has been inside your vein will be gently removed.  The port and catheter will be inspected to make sure that all the parts have been removed. Part of the catheter may be tested for bacteria.  The incision will be closed with stitches (sutures), adhesive strips, or skin glue.  A bandage (dressing) will be placed over the incision. The health care provider may apply gentle pressure over the dressing for about 5 minutes. The procedure may vary among health care providers and hospitals. What happens after the procedure?  Your blood pressure, heart rate, breathing rate, and blood oxygen level will be monitored until you leave the hospital or clinic.  You will be monitored to make sure that there is no bleeding from the site where the port was removed.  Do not drive for 24 hours if you were given a sedative during your procedure. Summary  Implanted port removal is a procedure to remove the port and catheter that are implanted under your skin.  Before the procedure, follow your health care provider's  instructions about changing or stopping your regular medicines. This is especially important if you are taking diabetes medicines or blood thinners.  If you will be going home right after the procedure, plan to have a responsible adult care for you for at least 24 hours after you leave the hospital or clinic. This information is not intended to replace advice given to you by your health care provider. Make sure you discuss any questions you have with your health care provider. Document Released: 04/29/2015 Document Revised: 07/01/2017 Document Reviewed: 07/01/2017 Elsevier Patient Education  2020 [employer].

## 2019-01-02 NOTE — H&P (Signed)
[facility] History & Physical Update  The patient was interviewed and re-examined.  The patient's previous History and Physical has been reviewed and is unchanged.  There is no change in the plan of care. We plan to proceed with the scheduled procedure.  Leotis Pain, MD  01/02/2019, 8:16 AM

## 2019-02-14 ENCOUNTER — Other Ambulatory Visit: Payer: Self-pay | Admitting: Internal Medicine

## 2019-02-14 DIAGNOSIS — Z20822 Contact with and (suspected) exposure to covid-19: Secondary | ICD-10-CM

## 2019-02-15 LAB — NOVEL CORONAVIRUS, NAA: SARS-CoV-2, NAA: NOT DETECTED

## 2019-03-24 ENCOUNTER — Other Ambulatory Visit: Payer: Self-pay

## 2019-03-24 ENCOUNTER — Ambulatory Visit
Admission: RE | Admit: 2019-03-24 | Discharge: 2019-03-24 | Disposition: A | Discharge status: Home / Self Care | Payer: Medicare HMO | Source: Ambulatory Visit | Attending: Oncology | Admitting: Oncology

## 2019-03-24 DIAGNOSIS — C3491 Malignant neoplasm of unspecified part of right bronchus or lung: Secondary | ICD-10-CM | POA: Insufficient documentation

## 2019-03-24 NOTE — Progress Notes (Signed)
Patient pre screened for office appointment, no questions or concerns today. 

## 2019-03-27 ENCOUNTER — Inpatient Hospital Stay (HOSPITAL_BASED_OUTPATIENT_CLINIC_OR_DEPARTMENT_OTHER): Payer: Medicare HMO | Admitting: Oncology

## 2019-03-27 ENCOUNTER — Other Ambulatory Visit: Payer: Self-pay

## 2019-03-27 ENCOUNTER — Encounter: Payer: Self-pay | Admitting: Oncology

## 2019-03-27 ENCOUNTER — Inpatient Hospital Stay: Payer: Medicare HMO | Attending: Oncology

## 2019-03-27 VITALS — BP 129/79 | HR 79 | Temp 97.0°F | Resp 18 | Wt 192.7 lb

## 2019-03-27 DIAGNOSIS — Z7901 Long term (current) use of anticoagulants: Secondary | ICD-10-CM | POA: Diagnosis not present

## 2019-03-27 DIAGNOSIS — C3491 Malignant neoplasm of unspecified part of right bronchus or lung: Secondary | ICD-10-CM | POA: Diagnosis present

## 2019-03-27 DIAGNOSIS — R05 Cough: Secondary | ICD-10-CM

## 2019-03-27 DIAGNOSIS — F17210 Nicotine dependence, cigarettes, uncomplicated: Secondary | ICD-10-CM | POA: Insufficient documentation

## 2019-03-27 DIAGNOSIS — I8290 Acute embolism and thrombosis of unspecified vein: Secondary | ICD-10-CM | POA: Diagnosis not present

## 2019-03-27 DIAGNOSIS — D7589 Other specified diseases of blood and blood-forming organs: Secondary | ICD-10-CM | POA: Diagnosis not present

## 2019-03-27 DIAGNOSIS — Z90721 Acquired absence of ovaries, unilateral: Secondary | ICD-10-CM | POA: Insufficient documentation

## 2019-03-27 DIAGNOSIS — R059 Cough, unspecified: Secondary | ICD-10-CM

## 2019-03-27 DIAGNOSIS — Z86718 Personal history of other venous thrombosis and embolism: Secondary | ICD-10-CM | POA: Diagnosis not present

## 2019-03-27 DIAGNOSIS — I10 Essential (primary) hypertension: Secondary | ICD-10-CM | POA: Diagnosis not present

## 2019-03-27 DIAGNOSIS — Z79899 Other long term (current) drug therapy: Secondary | ICD-10-CM | POA: Insufficient documentation

## 2019-03-27 DIAGNOSIS — Z720 Tobacco use: Secondary | ICD-10-CM

## 2019-03-27 DIAGNOSIS — Z5112 Encounter for antineoplastic immunotherapy: Secondary | ICD-10-CM

## 2019-03-27 LAB — COMPREHENSIVE METABOLIC PANEL
ALT: 17 U/L (ref 0–44)
AST: 15 U/L (ref 15–41)
Albumin: 3.8 g/dL (ref 3.5–5.0)
Alkaline Phosphatase: 96 U/L (ref 38–126)
Anion gap: 6 (ref 5–15)
BUN: 9 mg/dL (ref 8–23)
CO2: 25 mmol/L (ref 22–32)
Calcium: 8.7 mg/dL — ABNORMAL LOW (ref 8.9–10.3)
Chloride: 106 mmol/L (ref 98–111)
Creatinine, Ser: 0.58 mg/dL (ref 0.44–1.00)
GFR calc Af Amer: >60 mL/min (ref >60)
GFR calc non Af Amer: >60 mL/min (ref >60)
Glucose, Bld: 120 mg/dL — ABNORMAL HIGH (ref 70–99)
Potassium: 3.4 mmol/L — ABNORMAL LOW (ref 3.5–5.1)
Sodium: 137 mmol/L (ref 135–145)
Total Bilirubin: 0.5 mg/dL (ref 0.3–1.2)
Total Protein: 6.8 g/dL (ref 6.5–8.1)

## 2019-03-27 LAB — CBC WITH DIFFERENTIAL/PLATELET
Abs Immature Granulocytes: 0.01 10*3/uL (ref 0.00–0.07)
Basophils Absolute: 0.1 10*3/uL (ref 0.0–0.1)
Basophils Relative: 1 %
Eosinophils Absolute: 0.9 10*3/uL — ABNORMAL HIGH (ref 0.0–0.5)
Eosinophils Relative: 15 %
HCT: 35.5 % — ABNORMAL LOW (ref 36.0–46.0)
Hemoglobin: 12.1 g/dL (ref 12.0–15.0)
Immature Granulocytes: 0 %
Lymphocytes Relative: 25 %
Lymphs Abs: 1.4 10*3/uL (ref 0.7–4.0)
MCH: 35 pg — ABNORMAL HIGH (ref 26.0–34.0)
MCHC: 34.1 g/dL (ref 30.0–36.0)
MCV: 102.6 fL — ABNORMAL HIGH (ref 80.0–100.0)
Monocytes Absolute: 0.4 10*3/uL (ref 0.1–1.0)
Monocytes Relative: 7 %
Neutro Abs: 3.1 10*3/uL (ref 1.7–7.7)
Neutrophils Relative %: 52 %
Platelets: 309 10*3/uL (ref 150–400)
RBC: 3.46 MIL/uL — ABNORMAL LOW (ref 3.87–5.11)
RDW: 12.8 % (ref 11.5–15.5)
WBC: 5.9 10*3/uL (ref 4.0–10.5)
nRBC: 0 % (ref 0.0–0.2)

## 2019-03-27 LAB — TSH: TSH: 2.292 u[IU]/mL (ref 0.350–4.500)

## 2019-03-27 NOTE — Progress Notes (Signed)
Hematology/Oncology follow up note [facility] Telephone:(336) [phone] Fax:([phone]) [phone]   Patient Care Team: Sharyne Peach, MD as PCP - General (Family Medicine) Earlie Server, MD as Medical Oncologist (Medical Oncology) Telford Nab, RN as Registered Nurse  REASON FOR VISIT Follow-up for evaluation prior to immunotherapy for stage IIIA non-small cell lung cancer-adenocarcinoma   HISTORY OF PRESENTING ILLNESS:  Connie Powers is a  68 y.o.  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  CT lung cancer screen 05/27/2017   1. A few bilateral pulmonary nodules measuring up to 5 mm are indeterminate. Considering the suspicious 2.0 cm mediastinal adenopathy, follow up with PET/CT is recommended. 2. Mild bronchial wall thickening could represent airway disease including bronchitis.ACR Lung-RADS Category and Recommendation*: ACR Lung-RADS Category 2S (S modifier for mediastinal adenopathy)  PET scan 3/112019 1. No hypermetabolic pulmonary nodules.2. Hypermetabolic enlarged right paratracheal lymph node, of uncertain etiology in isolation. Lymphoproliferative disorder cannot be excluded. 3.  Aortic atherosclerosis (ICD10-170.0).  EBUS biopsy of paratracheal node showed non small cell lung cancer favoring adenocarcinoma. Case was discussed on tumor board on 09/16/2017 and consensus recommendation is to treat as Stage IIIA disease given the invasion of mediastinum.   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 MRI brain is contraindicated. CT brain negative for metastatic disease.  During the interval patient also was referred to vascular surgery for evaluation of malposition of Medi port.  Patient's right jugular port was removed and right internal jugular vein Mediport was placed by Dr.Dew on 8/5 2019.  # CT chest scan done 06/02/2018 which was independent reviewed by me. CT  chest showed radiation changes in the medial right upper lobe/paramediastinal region.  No evidence of recurrent or metastatic disease. Nonocclusive pericatheter thrombus/fibrin sheath along patient's right chest port. 06/06/2018 US venous upper right extremity ultrasound showed nonocclusive thrombus in the right jugular vein.  The port catheter can be seen associated with this thrombus.  The jugular vein is partially occlusive.  There is also partially occlusive thrombus in the right subclavian vein.  The right axillary, brachial, radial, and ulnar veins are compressible.  Doppler analysis demonstrated phasicity of the Doppler waveform. Patient was started on Eliquis for anticoagulation.   Current Treatment S/p concurrent Chemotherapy [name] /taxol  Weekly x 6] and RT.  Started on maintenance durvalumab every 2 weeks on 12/27/2017. 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 She has stopped smoking. Finish 1 year of Durvalumab [ 26 cycles] on 7/13/ 2020/   INTERVAL HISTORY Connie Powers is a 68 y.o. female who has above history reviewed by me today presents for follow-up of stage III non-small cell lung cancer Patient has no new concerns. During interval his port has been discontinued due to the history of catheter induced thrombosis..  Patient is currently off anticoagulation.   Denies any focal swelling or pain. Chronic cough at baseline.  Continue to smoke a few cigarettes a day. Denies any pain today.    Review of Systems  Constitutional: Negative for appetite change, chills, fatigue and fever.  HENT:   Negative for hearing loss and voice change.   Eyes: Negative for eye problems.  Respiratory: Negative for chest tightness and cough.        Chronic cough  Cardiovascular: Negative for chest pain.  Gastrointestinal: Negative for abdominal distention, abdominal pain and blood in stool.  Endocrine: Negative for hot flashes.  Genitourinary: Negative for difficulty  urinating and frequency.   Musculoskeletal: Negative for arthralgias.  Skin: Negative for itching and rash.  Neurological: Negative for extremity weakness.  Hematological: Negative for adenopathy.  Psychiatric/Behavioral: Negative for confusion.    MEDICAL HISTORY:  Past Medical History:  Diagnosis Date  . Brain aneurysm   . Difficult intubation   . Hypertension   . Non-small cell cancer of right lung ([place]) 09/22/2017   Chemo + Rad tx's.   . Viral meningitis     SURGICAL HISTORY: Past Surgical History:  Procedure Laterality Date  . ENDOBRONCHIAL ULTRASOUND N/A 09/09/2017   Procedure: ENDOBRONCHIAL ULTRASOUND;  Surgeon: Laverle Hobby, MD;  Location: [facility] ORS;  Service: Pulmonary;  Laterality: N/A;  . OOPHORECTOMY Left   . PORTA CATH INSERTION N/A 09/29/2017   Procedure: PORTA CATH INSERTION;  Surgeon: Algernon Huxley, MD;  Location: [facility];  Service: Cardiovascular;  Laterality: N/A;  . PORTA CATH INSERTION N/A 01/03/2018   Procedure: PORTA CATH INSERTION;  Surgeon: Algernon Huxley, MD;  Location: [facility];  Service: Cardiovascular;  Laterality: N/A;  . PORTA CATH REMOVAL N/A 01/02/2019   Procedure: PORTA CATH REMOVAL;  Surgeon: Algernon Huxley, MD;  Location: [facility];  Service: Cardiovascular;  Laterality: N/A;  . surgical repair for brain tumor  1969   clips in head  . WISDOM TOOTH EXTRACTION      SOCIAL HISTORY: Social History   Socioeconomic History  . Marital status: Married    Spouse name: Not on file  . Number of children: Not on file  . Years of education: Not on file  . Highest education level: Not on file  Occupational History  . Not on file  Social Needs  . Financial resource strain: Not on file  . Food insecurity    Worry: Not on file    Inability: Not on file  . Transportation needs    Medical: Not on file    Non-medical: Not on file  Tobacco Use  . Smoking status: Former Smoker    Packs/day: 0.25    Quit date:  10/03/2017    Years since quitting: 1.4  . Smokeless tobacco: Never Used  Substance and Sexual Activity  . Alcohol use: Not Currently    Comment: social  . Drug use: Not Currently    Types: Cocaine  . Sexual activity: Not on file  Lifestyle  . Physical activity    Days per week: Not on file    Minutes per session: Not on file  . Stress: Not on file  Relationships  . Social Herbalist on phone: Not on file    Gets together: Not on file    Attends religious service: Not on file    Active member of club or organization: Not on file    Attends meetings of clubs or organizations: Not on file    Relationship status: Not on file  . Intimate partner violence    Fear of current or ex partner: Not on file    Emotionally abused: Not on file    Physically abused: Not on file    Forced sexual activity: Not on file  Other Topics Concern  . Not on file  Social History Narrative  . Not on file    FAMILY HISTORY: Family History  Problem Relation Age of Onset  . Diabetes Mother   . Hypertension Mother   . Hyperlipidemia Mother   . Arthritis Mother   .  Hypertension Father   . Hyperlipidemia Father   . Heart attack Father        x2  . Breast cancer Neg Hx     ALLERGIES:  has No Known Allergies.  MEDICATIONS:  Current Outpatient Medications  Medication Sig Dispense Refill  . amLODipine (NORVASC) 10 MG tablet Take 10 mg by mouth daily.  11  . Cyanocobalamin (B-12) 1000 MCG CAPS Take 1,000 mcg by mouth daily. 30 capsule 3  . docusate sodium (COLACE) 100 MG capsule Take 100 mg by mouth daily.    Marland Kitchen umeclidinium-vilanterol (ANORO ELLIPTA) 62.5-25 MCG/INH AEPB Inhale 1 puff into the lungs daily as needed (shortness of breath).     Marland Kitchen apixaban (ELIQUIS) 5 MG TABS tablet Take 1 tablet (5 mg total) by mouth 2 (two) times daily. (Patient not taking: Reported on 03/27/2019) 60 tablet 0  . lidocaine-prilocaine (EMLA) cream Apply to affected area once (Patient not taking: Reported on  03/27/2019) 30 g 3  . ondansetron (ZOFRAN) 8 MG tablet Take 1 tablet (8 mg total) by mouth 2 (two) times daily as needed for refractory nausea / vomiting. Start on day 3 after chemo. (Patient not taking: Reported on 03/27/2019) 30 tablet 1  . potassium chloride SA (K-DUR) 20 MEQ tablet Take 1 tablet (20 mEq total) by mouth daily. (Patient not taking: Reported on 03/27/2019) 3 tablet 0  . prochlorperazine (COMPAZINE) 10 MG tablet Take 1 tablet (10 mg total) by mouth every 6 (six) hours as needed (Nausea or vomiting). (Patient not taking: Reported on 03/27/2019) 30 tablet 1   No current facility-administered medications for this visit.      PHYSICAL EXAMINATION: ECOG PERFORMANCE STATUS: 0 - Asymptomatic Vitals:   03/27/19 1005  BP: 129/79  Pulse: 79  Resp: 18  Temp: (!) 97 F (36.1 C)  SpO2: 98%   Filed Weights   03/27/19 1005  Weight: 192 lb 11.2 oz (87.4 kg)    Physical Exam Constitutional:      General: She is not in acute distress. HENT:     Head: Normocephalic and atraumatic.  Eyes:     General: No scleral icterus.       Left eye: No discharge.     Conjunctiva/sclera: Conjunctivae normal.     Pupils: Pupils are equal, round, and reactive to light.  Neck:     Musculoskeletal: Normal range of motion and neck supple.     Comments: No neck swelling Cardiovascular:     Rate and Rhythm: Normal rate and regular rhythm.     Heart sounds: Normal heart sounds. No murmur.  Pulmonary:     Effort: Pulmonary effort is normal. No respiratory distress.     Breath sounds: Normal breath sounds. No wheezing or rales.  Chest:     Chest wall: No tenderness.  Abdominal:     General: Bowel sounds are normal. There is no distension.     Palpations: Abdomen is soft. There is no mass.     Tenderness: There is no abdominal tenderness. There is no guarding.  Musculoskeletal: Normal range of motion.        General: No deformity.  Lymphadenopathy:     Cervical: No cervical adenopathy.   Skin:    General: Skin is warm and dry.     Findings: No erythema or rash.     Comments: Port was discontinued..  Neurological:     Mental Status: She is alert and oriented to person, place, and time.     Cranial Nerves:  No cranial nerve deficit.     Coordination: Coordination normal.  Psychiatric:        Behavior: Behavior normal.        Thought Content: Thought content normal.      LABORATORY DATA:  I have reviewed the data as listed Lab Results  Component Value Date   WBC 5.9 03/27/2019   HGB 12.1 03/27/2019   HCT 35.5 (L) 03/27/2019   MCV 102.6 (H) 03/27/2019   PLT 309 03/27/2019   Recent Labs    11/28/18 0842 12/12/18 0909 03/27/19 0951  NA 138 137 137  K 3.4* 3.3* 3.4*  CL 105 107 106  CO2 26 23 25   GLUCOSE 102* 123* 120*  BUN 9 10 9   CREATININE 0.48 0.54 0.58  CALCIUM 8.7* 8.7* 8.7*  GFRNONAA >60 >60 >60  GFRAA >60 >60 >60  PROT 7.1 7.6 6.8  ALBUMIN 3.9 3.9 3.8  AST 17 13* 15  ALT 19 15 17   ALKPHOS 99 94 96  BILITOT 0.4 0.6 0.5   RADIOGRAPHIC STUDIES: I have personally reviewed the radiological images as listed and agreed with the findings in the report. 12/20/2017 Repeat CT scan showed interval decreased of disease. CT images were Independently reviewed by me and discussed with patient. New 16mm right lung lesion, indeterminate.  02/25/2018 CT chest with contrast, presumed evolutionary changes of radiation therapy in the upper right hemithorax.  Aortic atherosclerosis, emphysema. Ct Chest Wo Contrast  Result Date: 03/24/2019 CLINICAL DATA:  Non-small cell lung cancer EXAM: CT CHEST WITHOUT CONTRAST TECHNIQUE: Multidetector CT imaging of the chest was performed following the standard protocol without IV contrast. COMPARISON:  12/23/2018 FINDINGS: Cardiovascular: Heart is normal in size.  No pericardial effusion. No evidence of thoracic aortic aneurysm. Mild atherosclerotic calcifications of the aortic root/arch. Mediastinum/Nodes: No suspicious mediastinal  lymphadenopathy. Visualized thyroid is unremarkable. Lungs/Pleura: Radiation changes in the medial right upper lobe/paramediastinal region. Mild centrilobular and paraseptal emphysematous changes, upper lung predominant. Mild dependent atelectasis in the bilateral lower lobes. No focal consolidation. Mild subpleural reticulation/ground-glass opacity in the lateral right lung base (series 3/image 97) and left lung base (series 3/image 102). Stable 6 mm posterior left lower lobe nodule (series 3/image 84), unchanged since 2018, benign. No suspicious pulmonary nodules. No pleural effusion or pneumothorax. Upper Abdomen: Visualized upper abdomen is notable for a tiny hiatal hernia. Musculoskeletal: Mild degenerative changes of the visualized thoracolumbar spine. IMPRESSION: Radiation changes in the right hemithorax. Stable 6 mm left lower lobe nodule, unchanged since 2018, benign. No findings specific for recurrent or metastatic disease. Aortic Atherosclerosis (ICD10-I70.0) and Emphysema (ICD10-J43.9). Electronically Signed   By: Julian Hy M.D.   On: 03/24/2019 13:46     ASSESSMENT & PLAN:  Cancer Staging Non-small cell lung cancer [facility]) Staging form: Lung, AJCC 8th Edition - Clinical stage from 09/22/2017: Stage Unknown (cTX, cN1, cM0) - Signed by Earlie Server, MD on 09/22/2017 - Pathologic: No stage assigned - Unsigned  1. Non-small cell cancer of right lung ([place])   2. Tobacco abuse   3. Macrocytosis without anemia   4. Thrombosis    #Clinical stage IIIA lung NSCLC, favoring adenocarcinoma Patient has been doing well  CT chest without contrast surveillance scan was independently reviewed by me and discussed with patient. Stable examination.  No new or progressive disease.  Stable appearance of radiation scarring. No change of the left lower lobe 6 mm lung nodule.  Discussed about continue CT scan every 3 to 4 months for surveillance.  #Tobacco abuse,  discussed with patient and encourage smoke  cessation. #Macrocytosis without anemia.  Hemoglobin stable.  MCV 102. Vitamin B12 level was checked in May and was elevated at 1341.  I will repeat B12 level at the next visit. #History of catheter induced deep vein thrombosis, previously treated with Eliquis.  Mediport was removed by vascular surgeon.  Eliquis is discontinued.  Continue off anticoagulation.  RTC 3 months after CT surveillance scan. Orders Placed This Encounter  Procedures  . CT Chest Wo Contrast    Standing Status:   Future    Standing Expiration Date:   03/26/2020    Order Specific Question:   Preferred imaging location?    Answer:   [facility]    Order Specific Question:   Radiology Contrast Protocol - do NOT remove file path    Answer:   [email or web address]    Earlie Server, MD, PhD

## 2019-04-07 ENCOUNTER — Telehealth: Payer: Self-pay

## 2019-04-07 NOTE — Telephone Encounter (Signed)
Telephone call to patient to discuss SCP visit.   Patient in agreement for me to mail SCP packet and I will call her Nov. 17th at 3:30 to discuss post cancer care.   Packet mailed.

## 2019-04-08 IMAGING — CT NM PET TUM IMG INITIAL (PI) SKULL BASE T - THIGH
1 of 9 series · 1 of 25 positions shown · non-contrast
Comparison: None.

CLINICAL DATA: Initial treatment strategy for pulmonary nodules.

EXAM:
NUCLEAR MEDICINE PET SKULL BASE TO THIGH
TECHNIQUE: 9.1 mCi F-18 FDG was injected intravenously. Full-ring PET imaging
was performed from the skull base to thigh after the radiotracer. CT
data was obtained and used for attenuation correction and anatomic
localization.
Fasting blood glucose: 107 mg/dl
Mediastinal blood pool activity: SUV max

[Series 3: ct wb 5.0 b30f · axial · 5.0mm · 0.98mm/px · 1 of 290 slices shown]
[im 290/290  brain]
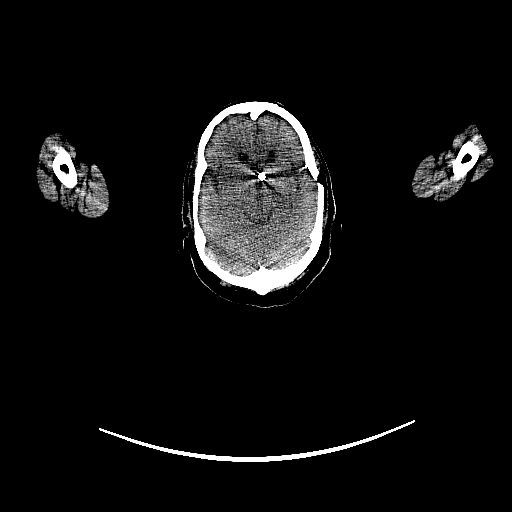

[1 of 25 positions shown; findings below may reference images not displayed]

FINDINGS: NECK: No hypermetabolic lymph nodes in the neck.

Incidental CT findings: None.

CHEST: Right paratracheal lymph node measures 1.9 cm with an SUV max
of 7 1. No additional hypermetabolic mediastinal, hilar or axillary
lymph nodes. No hypermetabolic pulmonary nodules.

Incidental CT findings: None.

ABDOMEN/PELVIS: No abnormal hypermetabolism in the liver, adrenal
glands, spleen or pancreas. No hypermetabolic lymph nodes.

Incidental CT findings: Atherosclerotic calcification of the
arterial vasculature without abdominal aortic aneurysm. Bladder wall
appears thickened but the bladder is under distended.

SKELETON: No abnormal osseous hypermetabolism.

Incidental CT findings: none
IMPRESSION: 1. No hypermetabolic pulmonary nodules.
2. Hypermetabolic enlarged right paratracheal lymph node, of
uncertain etiology in isolation. Lymphoproliferative disorder cannot
be excluded.
3.  Aortic atherosclerosis (0IPU2-170.0).

## 2019-04-18 ENCOUNTER — Inpatient Hospital Stay (HOSPITAL_BASED_OUTPATIENT_CLINIC_OR_DEPARTMENT_OTHER): Payer: Medicare HMO | Admitting: Nurse Practitioner

## 2019-04-18 ENCOUNTER — Other Ambulatory Visit: Payer: Self-pay

## 2019-04-18 NOTE — Progress Notes (Signed)
appt rescheduled to 11/23.

## 2019-04-24 ENCOUNTER — Inpatient Hospital Stay: Payer: Medicare HMO | Attending: Oncology | Admitting: Nurse Practitioner

## 2019-04-24 ENCOUNTER — Encounter: Payer: Self-pay | Admitting: Nurse Practitioner

## 2019-04-24 DIAGNOSIS — C3491 Malignant neoplasm of unspecified part of right bronchus or lung: Secondary | ICD-10-CM | POA: Diagnosis not present

## 2019-04-24 NOTE — Progress Notes (Signed)
Survivorship Care Plan visit completed.  Treatment summary reviewed and previously mailed to patient.  ASCO answers booklet reviewed and given to patient.  CARE program and Cancer Transitions discussed with patient along with other resources cancer center offers to patients and caregivers.  Patient verbalized understanding.

## 2019-04-24 NOTE — Progress Notes (Signed)
Virtual Visit Progress Note  Survivorship Clinic Consult note [facility]  [phone] [phone]  Patient Care Team: Sharyne Peach, MD as PCP - General (Family Medicine) Earlie Server, MD as Medical Oncologist (Medical Oncology) Noreene Filbert, MD as Referring Physician (Radiation Oncology) Algernon Huxley, MD as Referring Physician (Vascular Surgery) Telford Nab, RN as Registered Nurse   Name of the patient: Connie Powers  [id number]  [birth year]   Date of visit: 04/24/19  CLINIC: Survivorship  I connected with Mingo Amber on 04/24/19 at  2:00 PM EST by video enabled telemedicine visit and verified that I am speaking with the correct person using two identifiers.   I discussed the limitations, risks, security and privacy concerns of performing an evaluation and management service by telemedicine and the availability of in-person appointments. I also discussed with the patient that there may be a patient responsible charge related to this service. The patient expressed understanding and agreed to proceed.   Other persons participating in the visit and their role in the encounter: Magdalene Patricia, RN (review Survivorship Care Plan)  Patient's location: home Provider's location: clinic  Chief Complaint: Review Survivorship Care Plan and address survivorship needs  REASON FOR VISIT:  Survivorship Care Plan visit & to address acute survivorship needs   BRIEF ONCOLOGY HISTORY: Oncology History  Non-small cell lung cancer ([place])  09/22/2017 Initial Diagnosis   Non-small cell cancer of right lung ([place])   09/22/2017 Cancer Staging   Staging form: Lung, AJCC 8th Edition - Clinical stage from 09/22/2017: Stage Unknown (cTX, cN1, cM0) - Signed by Earlie Server, MD on 09/22/2017   10/01/2017 -  Chemotherapy   The patient had palonosetron (ALOXI) injection 0.25 mg, 0.25 mg, Intravenous,  Once, 7 of 7 cycles Administration: 0.25 mg (10/11/2017), 0.25  mg (10/18/2017), 0.25 mg (11/09/2017), 0.25 mg (11/16/2017), 0.25 mg (10/26/2017), 0.25 mg (11/02/2017), 0.25 mg (11/23/2017) CARBOplatin (PARAPLATIN) 190 mg in sodium chloride 0.9 % 250 mL chemo infusion, 190 mg (100 % of original dose 194.4 mg), Intravenous,  Once, 7 of 7 cycles Dose modification:   (original dose 194.4 mg, Cycle 1) Administration: 190 mg (10/11/2017), 190 mg (10/18/2017), 190 mg (11/09/2017), 190 mg (11/16/2017), 190 mg (10/26/2017), 190 mg (11/02/2017), 190 mg (11/23/2017) PACLitaxel (TAXOL) 84 mg in sodium chloride 0.9 % 250 mL chemo infusion (</= 80mg /m2), 45 mg/m2 = 84 mg, Intravenous,  Once, 7 of 7 cycles Administration: 84 mg (10/11/2017), 84 mg (10/18/2017), 84 mg (11/09/2017), 84 mg (11/16/2017), 84 mg (10/26/2017), 84 mg (11/02/2017), 84 mg (11/23/2017)  for chemotherapy treatment.    12/27/2017 -  Chemotherapy   The patient had durvalumab (IMFINZI) 860 mg in sodium chloride 0.9 % 100 mL chemo infusion, 10 mg/kg = 860 mg, Intravenous,  Once, 26 of 26 cycles Administration: 860 mg (12/27/2017), 860 mg (01/10/2018), 860 mg (01/24/2018), 860 mg (02/07/2018), 860 mg (02/21/2018), 860 mg (03/07/2018), 860 mg (03/21/2018), 860 mg (04/04/2018), 860 mg (04/18/2018), 860 mg (05/02/2018), 860 mg (05/16/2018), 860 mg (05/30/2018), 860 mg (06/13/2018), 860 mg (06/27/2018), 860 mg (07/11/2018), 860 mg (08/08/2018), 860 mg (07/25/2018), 860 mg (08/22/2018), 860 mg (09/05/2018), 860 mg (09/19/2018), 860 mg (10/03/2018), 860 mg (10/17/2018), 860 mg (10/31/2018), 860 mg (11/14/2018), 860 mg (11/28/2018), 860 mg (12/12/2018)  for chemotherapy treatment.      INTERVAL HISTORY: Patient presents to the survivorship clinic today for initial meeting to review survivorship care plan detailing treatment course for lung cancer, as well as monitoring long-term side effects of that treatment,  education regarding health maintenance, screening, and overall wellness and health promotion.  Overall, she reports feeling well since completing treatment  and offers no specific complaints today.  ADDITIONAL REVIEW OF SYSTEMS:  Review of Systems  Constitutional: Negative for chills, malaise/fatigue and weight loss.  HENT: Negative for congestion, hearing loss, sore throat and tinnitus.   Eyes: Negative for blurred vision and pain.  Respiratory: Negative for cough, hemoptysis, shortness of breath and wheezing.   Cardiovascular: Negative for chest pain and palpitations.  Gastrointestinal: Negative for abdominal pain, blood in stool, constipation, diarrhea, melena and nausea.  Genitourinary: Negative for dysuria and hematuria.  Musculoskeletal: Negative for back pain, falls, joint pain and myalgias.  Skin: Negative for itching and rash.  Neurological: Negative for dizziness, weakness and headaches.  Endo/Heme/Allergies: Negative for polydipsia. Does not bruise/bleed easily.  Psychiatric/Behavioral: Negative for depression. The patient is not nervous/anxious and does not have insomnia.    PAST MEDICAL & SURGICAL HISTORY:  Past Medical History:  Diagnosis Date  . Brain aneurysm   . Difficult intubation   . Hypertension   . Non-small cell cancer of right lung ([place]) 09/22/2017   Chemo + Rad tx's.   . Viral meningitis    Past Surgical History:  Procedure Laterality Date  . ENDOBRONCHIAL ULTRASOUND N/A 09/09/2017   Procedure: ENDOBRONCHIAL ULTRASOUND;  Surgeon: Laverle Hobby, MD;  Location: [facility] ORS;  Service: Pulmonary;  Laterality: N/A;  . OOPHORECTOMY Left   . PORTA CATH INSERTION N/A 09/29/2017   Procedure: PORTA CATH INSERTION;  Surgeon: Algernon Huxley, MD;  Location: [place] [facility];  Service: Cardiovascular;  Laterality: N/A;  . PORTA CATH INSERTION N/A 01/03/2018   Procedure: PORTA CATH INSERTION;  Surgeon: Algernon Huxley, MD;  Location: [place] [facility];  Service: Cardiovascular;  Laterality: N/A;  . PORTA CATH REMOVAL N/A 01/02/2019   Procedure: PORTA CATH REMOVAL;  Surgeon: Algernon Huxley, MD;  Location: [place] [facility];   Service: Cardiovascular;  Laterality: N/A;  . surgical repair for brain tumor  1969   clips in head  . WISDOM TOOTH EXTRACTION      SOCIAL HISTORY:   CURRENT MEDICATIONS:  Current Outpatient Medications on File Prior to Visit  Medication Sig Dispense Refill  . amLODipine (NORVASC) 10 MG tablet Take 10 mg by mouth daily.  11  . Cyanocobalamin (B-12) 1000 MCG CAPS Take 1,000 mcg by mouth daily. 30 capsule 3  . docusate sodium (COLACE) 100 MG capsule Take 100 mg by mouth daily.    Marland Kitchen lidocaine-prilocaine (EMLA) cream Apply to affected area once (Patient not taking: Reported on 03/27/2019) 30 g 3  . ondansetron (ZOFRAN) 8 MG tablet Take 1 tablet (8 mg total) by mouth 2 (two) times daily as needed for refractory nausea / vomiting. Start on day 3 after chemo. (Patient not taking: Reported on 03/27/2019) 30 tablet 1  . prochlorperazine (COMPAZINE) 10 MG tablet Take 1 tablet (10 mg total) by mouth every 6 (six) hours as needed (Nausea or vomiting). (Patient not taking: Reported on 03/27/2019) 30 tablet 1  . umeclidinium-vilanterol (ANORO ELLIPTA) 62.5-25 MCG/INH AEPB Inhale 1 puff into the lungs daily as needed (shortness of breath).      No current facility-administered medications on file prior to visit.     ALLERGIES: No Known Allergies   PHYSICAL EXAM:  Exam limited d/t telemedicine  General: No acute distress. Unaccompanied.  Psych: Normal mood and affect for situation.  LABORATORY DATA:  None for this visit.  DIAGNOSTIC IMAGING:  None for this visit.   ASSESSMENT & PLAN:  Ms. Deshmukh is a pleasant 68 y.o. 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  Patient presents to survivorship clinic today for review of survivorship care plan and to address any acute survivorship concerns since completing treatment.  1. Lung cancer: Today, she received a copy of her Survivorship Care Plan [facility]) document, which was reviewed with in detail.  The  SCP details cancer treatment history and potential late/long-term side effects of those treatments.  We discussed the follow-up schedule which is based on NCCN guidelines and details interval imaging for surveillance of cancer. I have also shared a copy of this treatment summary/SCP with his PCP.  2. Survivorship Concerns: Patient denies specific survivorship concerns today.  3. Smoking cessation: I commended Ms. Coghill's continued efforts to remain tobacco-free.  We discussed that one of the most important risk reduction strategies in preventing cancer recurrence in lung cancer patients is smoking cessation.  She is committed to abstaining from tobacco.    4. Physical activity/Healthy eating: Getting adequate physical activity and maintaining a healthy diet as a cancer survivor is important for overall wellness and reduces the risk of cancer recurrence. We discussed the CARE program, which is a fitness program that is offered to cancer survivors free of charge.  We also reviewed "The Nutrition Rainbow" handout, as well as the American Cancer Society's booklet with recommendations for nutrition and physical activity.    5. Health promotion/Cancer screening:  Ms. Gravelle is reportedly up-to-date on her cancer screening tests and vaccinations. I encouraged her to talk with PCP about arranging appropriate cancer screening tests, as appropriate.   6. Support services/Counseling: It is not uncommon for this period of the patient's cancer care trajectory to be one of many emotions and stressors.  Ms. Pianka was encouraged to take advantage of our many support services programs, support groups, and/or counseling in coping with her new life as a cancer survivor after completing anti-cancer treatment. We specifically discussed the Cancer Transitions program. She was given a calendar of the cancer center's support services events, as well as brochures for our spiritual care and free counseling resources.  I advised  that due to COVID-19 pandemic some of these programs may currently be on hold for virtually held.  7. Port-a-cath: Patient had port placed for administration of chemotherapy.  I advised her that in general most oncologists would like for her to keep her port for approximately 1 year post treatment however, if it is bothersome, it can be removed sooner.  She will discuss with Dr.Yu in the future if she wishes to have removed.  Disposition/Return to clinic  - Return to survivorship clinic as needed; no additional follow-up needed at this time.  - Consider transitioning the patient to long-term survivorship, when clinically appropriate.    I discussed the assessment and treatment plan with the patient. The patient was provided an opportunity to ask questions and all were answered. The patient agreed with the plan and demonstrated an understanding of the instructions.   The patient was advised to call back or seek an in-person evaluation if the symptoms worsen or if the condition fails to improve as anticipated.   I provided 5 minutes of face-to-face video visit time during this encounter, and > 50% was spent counseling as documented under my assessment & plan.  Beckey Rutter, DNP, AGNP-C [place] at [facility] [phone] (clinic)  CC:  Dr. Tasia Catchings

## 2019-06-17 ENCOUNTER — Other Ambulatory Visit: Payer: Self-pay | Admitting: Nurse Practitioner

## 2019-06-23 ENCOUNTER — Other Ambulatory Visit: Payer: Self-pay

## 2019-06-23 ENCOUNTER — Ambulatory Visit
Admission: RE | Admit: 2019-06-23 | Discharge: 2019-06-23 | Disposition: A | Discharge status: Home / Self Care | Payer: Medicare HMO | Source: Ambulatory Visit | Attending: Oncology | Admitting: Oncology

## 2019-06-23 DIAGNOSIS — C3491 Malignant neoplasm of unspecified part of right bronchus or lung: Secondary | ICD-10-CM | POA: Diagnosis not present

## 2019-06-27 ENCOUNTER — Other Ambulatory Visit: Payer: Self-pay

## 2019-06-27 ENCOUNTER — Inpatient Hospital Stay: Payer: Medicare HMO

## 2019-06-27 ENCOUNTER — Inpatient Hospital Stay: Payer: Medicare HMO | Admitting: Oncology

## 2019-06-27 NOTE — Progress Notes (Signed)
Patient pre screened for office appointment, no questions or concerns today. Patient reminded of upcoming appointment time and date. 

## 2019-06-28 ENCOUNTER — Inpatient Hospital Stay: Payer: Medicare HMO | Attending: Oncology

## 2019-06-28 ENCOUNTER — Other Ambulatory Visit: Payer: Self-pay

## 2019-06-28 ENCOUNTER — Encounter: Payer: Self-pay | Admitting: Oncology

## 2019-06-28 ENCOUNTER — Inpatient Hospital Stay (HOSPITAL_BASED_OUTPATIENT_CLINIC_OR_DEPARTMENT_OTHER): Payer: Medicare HMO | Admitting: Oncology

## 2019-06-28 VITALS — BP 122/77 | HR 97 | Temp 97.4°F | Resp 16 | Wt 187.8 lb

## 2019-06-28 DIAGNOSIS — Z7901 Long term (current) use of anticoagulants: Secondary | ICD-10-CM | POA: Insufficient documentation

## 2019-06-28 DIAGNOSIS — Z90721 Acquired absence of ovaries, unilateral: Secondary | ICD-10-CM | POA: Insufficient documentation

## 2019-06-28 DIAGNOSIS — Z720 Tobacco use: Secondary | ICD-10-CM | POA: Diagnosis not present

## 2019-06-28 DIAGNOSIS — Z79899 Other long term (current) drug therapy: Secondary | ICD-10-CM | POA: Diagnosis not present

## 2019-06-28 DIAGNOSIS — Z87891 Personal history of nicotine dependence: Secondary | ICD-10-CM | POA: Diagnosis not present

## 2019-06-28 DIAGNOSIS — J439 Emphysema, unspecified: Secondary | ICD-10-CM | POA: Insufficient documentation

## 2019-06-28 DIAGNOSIS — Z9221 Personal history of antineoplastic chemotherapy: Secondary | ICD-10-CM | POA: Insufficient documentation

## 2019-06-28 DIAGNOSIS — R05 Cough: Secondary | ICD-10-CM | POA: Insufficient documentation

## 2019-06-28 DIAGNOSIS — I10 Essential (primary) hypertension: Secondary | ICD-10-CM | POA: Insufficient documentation

## 2019-06-28 DIAGNOSIS — Z8349 Family history of other endocrine, nutritional and metabolic diseases: Secondary | ICD-10-CM | POA: Insufficient documentation

## 2019-06-28 DIAGNOSIS — D7589 Other specified diseases of blood and blood-forming organs: Secondary | ICD-10-CM | POA: Diagnosis not present

## 2019-06-28 DIAGNOSIS — Z8261 Family history of arthritis: Secondary | ICD-10-CM | POA: Insufficient documentation

## 2019-06-28 DIAGNOSIS — Z8249 Family history of ischemic heart disease and other diseases of the circulatory system: Secondary | ICD-10-CM | POA: Diagnosis not present

## 2019-06-28 DIAGNOSIS — Z923 Personal history of irradiation: Secondary | ICD-10-CM | POA: Diagnosis not present

## 2019-06-28 DIAGNOSIS — C3491 Malignant neoplasm of unspecified part of right bronchus or lung: Secondary | ICD-10-CM

## 2019-06-28 LAB — CBC WITH DIFFERENTIAL/PLATELET
Abs Immature Granulocytes: 0.01 10*3/uL (ref 0.00–0.07)
Basophils Absolute: 0 10*3/uL (ref 0.0–0.1)
Basophils Relative: 1 %
Eosinophils Absolute: 0.4 10*3/uL (ref 0.0–0.5)
Eosinophils Relative: 7 %
HCT: 39.1 % (ref 36.0–46.0)
Hemoglobin: 12.6 g/dL (ref 12.0–15.0)
Immature Granulocytes: 0 %
Lymphocytes Relative: 26 %
Lymphs Abs: 1.4 10*3/uL (ref 0.7–4.0)
MCH: 34 pg (ref 26.0–34.0)
MCHC: 32.2 g/dL (ref 30.0–36.0)
MCV: 105.4 fL — ABNORMAL HIGH (ref 80.0–100.0)
Monocytes Absolute: 0.3 10*3/uL (ref 0.1–1.0)
Monocytes Relative: 6 %
Neutro Abs: 3.1 10*3/uL (ref 1.7–7.7)
Neutrophils Relative %: 60 %
Platelets: 356 10*3/uL (ref 150–400)
RBC: 3.71 MIL/uL — ABNORMAL LOW (ref 3.87–5.11)
RDW: 13.1 % (ref 11.5–15.5)
WBC: 5.2 10*3/uL (ref 4.0–10.5)
nRBC: 0 % (ref 0.0–0.2)

## 2019-06-28 LAB — COMPREHENSIVE METABOLIC PANEL
ALT: 20 U/L (ref 0–44)
AST: 21 U/L (ref 15–41)
Albumin: 4.1 g/dL (ref 3.5–5.0)
Alkaline Phosphatase: 97 U/L (ref 38–126)
Anion gap: 8 (ref 5–15)
BUN: 8 mg/dL (ref 8–23)
CO2: 24 mmol/L (ref 22–32)
Calcium: 8.9 mg/dL (ref 8.9–10.3)
Chloride: 106 mmol/L (ref 98–111)
Creatinine, Ser: 0.56 mg/dL (ref 0.44–1.00)
GFR calc Af Amer: >60 mL/min (ref >60)
GFR calc non Af Amer: >60 mL/min (ref >60)
Glucose, Bld: 133 mg/dL — ABNORMAL HIGH (ref 70–99)
Potassium: 3.7 mmol/L (ref 3.5–5.1)
Sodium: 138 mmol/L (ref 135–145)
Total Bilirubin: 0.7 mg/dL (ref 0.3–1.2)
Total Protein: 7.3 g/dL (ref 6.5–8.1)

## 2019-06-28 LAB — FOLATE: Folate: 12.7 ng/mL (ref >5.9)

## 2019-06-28 LAB — VITAMIN B12: Vitamin B-12: 967 pg/mL — ABNORMAL HIGH (ref 180–914)

## 2019-06-28 NOTE — Progress Notes (Signed)
Patient does not offer any problems today.  

## 2019-06-29 NOTE — Progress Notes (Signed)
Hematology/Oncology follow up note [facility] [phone] Fax:([phone]) [phone]   Patient Care Team: Sharyne Peach, MD as PCP - General (Family Medicine) Earlie Server, MD as Medical Oncologist (Medical Oncology) Noreene Filbert, MD as Referring Physician (Radiation Oncology) Algernon Huxley, MD as Referring Physician (Vascular Surgery) Telford Nab, RN as Registered Nurse  REASON FOR VISIT Follow-up for evaluation prior to immunotherapy for stage IIIA non-small cell lung cancer-adenocarcinoma   HISTORY OF PRESENTING ILLNESS:  Connie Powers is a  69 y.o.  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  CT lung cancer screen 05/27/2017   1. A few bilateral pulmonary nodules measuring up to 5 mm are indeterminate. Considering the suspicious 2.0 cm mediastinal adenopathy, follow up with PET/CT is recommended. 2. Mild bronchial wall thickening could represent airway disease including bronchitis.ACR Lung-RADS Category and Recommendation*: ACR Lung-RADS Category 2S (S modifier for mediastinal adenopathy)  PET scan 3/112019 1. No hypermetabolic pulmonary nodules.2. Hypermetabolic enlarged right paratracheal lymph node, of uncertain etiology in isolation. Lymphoproliferative disorder cannot be excluded. 3.  Aortic atherosclerosis (ICD10-170.0).  EBUS biopsy of paratracheal node showed non small cell lung cancer favoring adenocarcinoma. Case was discussed on tumor board on 09/16/2017 and consensus recommendation is to treat as Stage IIIA disease given the invasion of mediastinum.   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 MRI brain is contraindicated. CT brain negative for metastatic disease.  During the interval patient also was referred to vascular surgery for evaluation of malposition of Medi port.  Patient's right jugular port was removed and right internal jugular  vein Mediport was placed by Dr.Dew on 8/5 2019.  # CT chest scan done 06/02/2018 which was independent reviewed by me. CT chest showed radiation changes in the medial right upper lobe/paramediastinal region.  No evidence of recurrent or metastatic disease. Nonocclusive pericatheter thrombus/fibrin sheath along patient's right chest port. 06/06/2018 US venous upper right extremity ultrasound showed nonocclusive thrombus in the right jugular vein.  The port catheter can be seen associated with this thrombus.  The jugular vein is partially occlusive.  There is also partially occlusive thrombus in the right subclavian vein.  The right axillary, brachial, radial, and ulnar veins are compressible.  Doppler analysis demonstrated phasicity of the Doppler waveform. Patient was started on Eliquis for anticoagulation.   Current Treatment S/p concurrent Chemotherapy [name] /taxol  Weekly x 6] and RT.  Started on maintenance durvalumab every 2 weeks on 12/27/2017. 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 She has stopped smoking. Finish 1 year of Durvalumab [ 26 cycles] on 7/13/ 2020/   INTERVAL HISTORY Connie Powers is a 69 y.o. female who has above history reviewed by me today presents for follow-up of stage III non-small cell lung cancer Patient has no new complaints today.  She reports feeling well at baseline. She continues to smoke a few cigarettes a day. She has a chronic cough.    Review of Systems  Constitutional: Negative for appetite change, chills, fatigue and fever.  HENT:   Negative for hearing loss and voice change.   Eyes: Negative for eye problems.  Respiratory: Negative for chest tightness and cough.        Chronic cough  Cardiovascular: Negative for chest pain.  Gastrointestinal: Negative for abdominal distention, abdominal pain and blood in stool.  Endocrine: Negative for hot flashes.  Genitourinary: Negative for difficulty urinating and  frequency.     Musculoskeletal: Negative for arthralgias.  Skin: Negative for itching and rash.  Neurological: Negative for extremity weakness.  Hematological: Negative for adenopathy.  Psychiatric/Behavioral: Negative for confusion.    MEDICAL HISTORY:  Past Medical History:  Diagnosis Date  . Brain aneurysm   . Difficult intubation   . Hypertension   . Non-small cell cancer of right lung ([place]) 09/22/2017   Chemo + Rad tx's.   . Viral meningitis     SURGICAL HISTORY: Past Surgical History:  Procedure Laterality Date  . ENDOBRONCHIAL ULTRASOUND N/A 09/09/2017   Procedure: ENDOBRONCHIAL ULTRASOUND;  Surgeon: Laverle Hobby, MD;  Location: [facility] ORS;  Service: Pulmonary;  Laterality: N/A;  . OOPHORECTOMY Left   . PORTA CATH INSERTION N/A 09/29/2017   Procedure: PORTA CATH INSERTION;  Surgeon: Algernon Huxley, MD;  Location: [place] [facility];  Service: Cardiovascular;  Laterality: N/A;  . PORTA CATH INSERTION N/A 01/03/2018   Procedure: PORTA CATH INSERTION;  Surgeon: Algernon Huxley, MD;  Location: [place] [facility];  Service: Cardiovascular;  Laterality: N/A;  . PORTA CATH REMOVAL N/A 01/02/2019   Procedure: PORTA CATH REMOVAL;  Surgeon: Algernon Huxley, MD;  Location: [place] [facility];  Service: Cardiovascular;  Laterality: N/A;  . surgical repair for brain tumor  1969   clips in head  . WISDOM TOOTH EXTRACTION      SOCIAL HISTORY: Social History   Socioeconomic History  . Marital status: Married    Spouse name: Not on file  . Number of children: Not on file  . Years of education: Not on file  . Highest education level: Not on file  Occupational History  . Not on file  Tobacco Use  . Smoking status: Former Smoker    Packs/day: 0.25    Quit date: 10/03/2017    Years since quitting: 1.7  . Smokeless tobacco: Never Used  Substance and Sexual Activity  . Alcohol use: Not Currently    Comment: social  . Drug use: Not Currently    Types: Cocaine  . Sexual activity: Not on file   Other Topics Concern  . Not on file  Social History Narrative  . Not on file   Social Determinants of Health   Financial Resource Strain:   . Difficulty of Paying Living Expenses: Not on file  Food Insecurity:   . Worried About Charity fundraiser in the Last Year: Not on file  . Ran Out of Food in the Last Year: Not on file  Transportation Needs:   . Lack of Transportation (Medical): Not on file  . Lack of Transportation (Non-Medical): Not on file  Physical Activity:   . Days of Exercise per Week: Not on file  . Minutes of Exercise per Session: Not on file  Stress:   . Feeling of Stress : Not on file  Social Connections:   . Frequency of Communication with Friends and Family: Not on file  . Frequency of Social Gatherings with Friends and Family: Not on file  . Attends Religious Services: Not on file  . Active Member of Clubs or Organizations: Not on file  . Attends Archivist Meetings: Not on file  . Marital Status: Not on file  Intimate Partner Violence:   . Fear of Current or Ex-Partner: Not on file  . Emotionally Abused: Not on file  . Physically Abused: Not on file  . Sexually Abused: Not on file    FAMILY HISTORY: Family History  Problem Relation Age of Onset  . Diabetes Mother   . Hypertension Mother   . Hyperlipidemia Mother   . Arthritis Mother   . Hypertension Father   . Hyperlipidemia Father   . Heart attack Father        x2  . Breast cancer Neg Hx     ALLERGIES:  has No Known Allergies.  MEDICATIONS:  Current Outpatient Medications  Medication Sig Dispense Refill  . albuterol (VENTOLIN HFA) 108 (90 Base) MCG/ACT inhaler Inhale into the lungs.    Marland Kitchen amLODipine (NORVASC) 10 MG tablet Take 10 mg by mouth daily.  11  . Cyanocobalamin (B-12) 1000 MCG CAPS Take 1,000 mcg by mouth daily. 30 capsule 3  . docusate sodium (COLACE) 100 MG capsule Take 100 mg by mouth daily.    Marland Kitchen umeclidinium-vilanterol (ANORO ELLIPTA) 62.5-25 MCG/INH AEPB Inhale 1  puff into the lungs daily as needed (shortness of breath).      No current facility-administered medications for this visit.     PHYSICAL EXAMINATION: ECOG PERFORMANCE STATUS: 0 - Asymptomatic Vitals:   06/28/19 1329  BP: 122/77  Pulse: 97  Resp: 16  Temp: (!) 97.4 F (36.3 C)   Filed Weights   06/28/19 1329  Weight: 187 lb 12.8 oz (85.2 kg)    Physical Exam Constitutional:      General: She is not in acute distress. HENT:     Head: Normocephalic and atraumatic.  Eyes:     General: No scleral icterus.       Left eye: No discharge.     Conjunctiva/sclera: Conjunctivae normal.     Pupils: Pupils are equal, round, and reactive to light.  Cardiovascular:     Rate and Rhythm: Normal rate and regular rhythm.     Heart sounds: Normal heart sounds. No murmur.  Pulmonary:     Effort: Pulmonary effort is normal. No respiratory distress.     Breath sounds: Normal breath sounds. No wheezing or rales.  Chest:     Chest wall: No tenderness.  Abdominal:     General: Bowel sounds are normal. There is no distension.     Palpations: Abdomen is soft. There is no mass.     Tenderness: There is no abdominal tenderness. There is no guarding.  Musculoskeletal:        General: No deformity. Normal range of motion.     Cervical back: Normal range of motion and neck supple.  Lymphadenopathy:     Cervical: No cervical adenopathy.  Skin:    General: Skin is warm and dry.     Findings: No erythema or rash.     Comments: Port was discontinued..  Neurological:     Mental Status: She is alert and oriented to person, place, and time. Mental status is at baseline.     Cranial Nerves: No cranial nerve deficit.     Coordination: Coordination normal.  Psychiatric:        Mood and Affect: Mood normal.        Behavior: Behavior normal.        Thought Content: Thought content normal.      LABORATORY DATA:  I have reviewed the data as listed Lab Results  Component Value Date   WBC 5.2  06/28/2019   HGB 12.6 06/28/2019   HCT 39.1 06/28/2019   MCV 105.4 (H) 06/28/2019   PLT 356 06/28/2019   Recent Labs    12/12/18 0909 03/27/19 0951 06/28/19 1314  NA 137 137 138  K 3.3* 3.4* 3.7  CL 107 106 106  CO2 23 25 24   GLUCOSE 123* 120* 133*  BUN 10 9 8   CREATININE 0.54 0.58 0.56  CALCIUM 8.7* 8.7* 8.9  GFRNONAA >60 >60 >60  GFRAA >60 >60 >60  PROT 7.6 6.8 7.3  ALBUMIN 3.9 3.8 4.1  AST 13* 15 21  ALT 15 17 20   ALKPHOS 94 96 97  BILITOT 0.6 0.5 0.7   RADIOGRAPHIC STUDIES: I have personally reviewed the radiological images as listed and agreed with the findings in the report. 12/20/2017 Repeat CT scan showed interval decreased of disease. CT images were Independently reviewed by me and discussed with patient. New 101mm right lung lesion, indeterminate.  02/25/2018 CT chest with contrast, presumed evolutionary changes of radiation therapy in the upper right hemithorax.  Aortic atherosclerosis, emphysema. CT Chest Wo Contrast  Result Date: 06/23/2019 CLINICAL DATA:  Follow-up non-small cell lung cancer EXAM: CT CHEST WITHOUT CONTRAST TECHNIQUE: Multidetector CT imaging of the chest was performed following the standard protocol without IV contrast. COMPARISON:  Multiple priors most recent 03/24/2019 FINDINGS: Cardiovascular: Stable appearance of heart and great vessels in the chest with scattered atherosclerotic changes throughout the thoracic aorta. Heart size is normal without pericardial effusion. Central pulmonary vasculature with mild engorgement. Mediastinum/Nodes: No signs of thoracic inlet or axillary lymphadenopathy. Scattered lymph nodes throughout the mediastinum, largest is a subcarinal lymph node measuring approximately 8 mm. This is unchanged. Lungs/Pleura: Signs of centrilobular and paraseptal emphysema, mild worse towards the lung apices. Bandlike scarring extending from right hilum into the right upper lobe following treatment. No signs of consolidation or evidence of  pleural effusion. No signs of suspicious nodule. Small nodule at the left lung base stable at 6 mm. (Image 84, series 3) Subtle ground-glass in the medial right chest (image 104, series 3) measuring approximately 1 cm. Subpleural reticulation in the peripheral right lower lobe is improved. Airways are patent. Upper Abdomen: Incidental imaging of upper abdominal contents is unremarkable. Musculoskeletal: No signs of acute bone finding or evidence of destructive bone process. Signs of spinal degenerative change. IMPRESSION: 1. Stable post treatment changes in the right chest without specific signs of metastatic disease. 2. Stable small left lower lobe nodule. 3. Subtle ground-glass developing in medial right lung base, attention on follow-up. May be related to background inflammation. Aortic Atherosclerosis (ICD10-I70.0) and Emphysema (ICD10-J43.9). Electronically Signed   By: Zetta Bills M.D.   On: 06/23/2019 12:33     ASSESSMENT & PLAN:  Cancer Staging Non-small cell lung cancer [facility]) Staging form: Lung, AJCC 8th Edition - Clinical stage from 09/22/2017: Stage Unknown (cTX, cN1, cM0) - Signed by Earlie Server, MD on 09/22/2017 - Pathologic: No stage assigned - Unsigned  1. Non-small cell cancer of right lung ([place])   2. Tobacco abuse    #Clinical stage IIIA lung NSCLC, favoring adenocarcinoma Patient has been doing well  CT chest surveillance images were independently reviewed by me and discussed with patient .  Stable examination. No new or progressive disease.  Subtle groundglass developing in the medial right lung base.  Attention on follow-up. Stable left lower lobe 6 mm lung nodule.   Discussed about continue CT scan every 3 to 4 months for surveillance.  #Tobacco abuse, she continues to smoke  . Smoke cessation was discussed.   #Macrocytosis without anemia.  Hemoglobin stable.   Vitamin B12 level folate level normal. ? MDS.  Continue to monitor.  RTC 3 months after CT surveillance  scan. Orders Placed  This Encounter  Procedures  . CT Chest Wo Contrast    Standing Status:   Future    Standing Expiration Date:   06/27/2020    Order Specific Question:   ** REASON FOR EXAM (FREE TEXT)    Answer:   Non-small cell lung cancer,stage III, follow up    Order Specific Question:   Preferred imaging location?    Answer:   [facility]    Order Specific Question:   Radiology Contrast Protocol - do NOT remove file path    Answer:   [email or web address]  . CT Chest W Contrast    Standing Status:   Future    Standing Expiration Date:   06/28/2020    Order Specific Question:   If indicated for the ordered procedure, I authorize the administration of contrast media per Radiology protocol    Answer:   Yes    Order Specific Question:   Preferred imaging location?    Answer:   [facility]    Order Specific Question:   Radiology Contrast Protocol - do NOT remove file path    Answer:   [email or web address]    Earlie Server, MD, PhD

## 2019-09-26 ENCOUNTER — Ambulatory Visit
Admission: RE | Admit: 2019-09-26 | Discharge: 2019-09-26 | Disposition: A | Discharge status: Home / Self Care | Payer: Medicare HMO | Source: Ambulatory Visit | Attending: Oncology | Admitting: Oncology

## 2019-09-26 ENCOUNTER — Ambulatory Visit: Payer: Medicare HMO

## 2019-09-26 ENCOUNTER — Other Ambulatory Visit: Payer: Self-pay

## 2019-09-26 DIAGNOSIS — C3491 Malignant neoplasm of unspecified part of right bronchus or lung: Secondary | ICD-10-CM | POA: Diagnosis not present

## 2019-09-26 LAB — POCT I-STAT CREATININE: Creatinine, Ser: 0.7 mg/dL (ref 0.44–1.00)

## 2019-09-26 MED ORDER — IOHEXOL 300 MG/ML  SOLN
75.0000 mL | Freq: Once | INTRAMUSCULAR | Status: AC | PRN
  Administered 2019-09-26: 15:00:00 75 mL via INTRAVENOUS

## 2019-09-29 ENCOUNTER — Inpatient Hospital Stay: Payer: Medicare HMO

## 2019-09-29 ENCOUNTER — Ambulatory Visit
Admission: RE | Admit: 2019-09-29 | Discharge: 2019-09-29 | Disposition: A | Discharge status: Home / Self Care | Payer: Medicare HMO | Attending: Oncology | Admitting: Oncology

## 2019-09-29 ENCOUNTER — Encounter: Payer: Self-pay | Admitting: Oncology

## 2019-09-29 ENCOUNTER — Other Ambulatory Visit: Payer: Self-pay

## 2019-09-29 ENCOUNTER — Inpatient Hospital Stay: Payer: Medicare HMO | Attending: Oncology | Admitting: Oncology

## 2019-09-29 ENCOUNTER — Ambulatory Visit
Admission: RE | Admit: 2019-09-29 | Discharge: 2019-09-29 | Disposition: A | Discharge status: Home / Self Care | Payer: Medicare HMO | Source: Ambulatory Visit | Attending: Oncology | Admitting: Oncology

## 2019-09-29 VITALS — BP 131/78 | HR 81 | Temp 96.3°F | Resp 18 | Wt 193.8 lb

## 2019-09-29 DIAGNOSIS — C3491 Malignant neoplasm of unspecified part of right bronchus or lung: Secondary | ICD-10-CM

## 2019-09-29 DIAGNOSIS — Z833 Family history of diabetes mellitus: Secondary | ICD-10-CM | POA: Diagnosis not present

## 2019-09-29 DIAGNOSIS — Z7901 Long term (current) use of anticoagulants: Secondary | ICD-10-CM | POA: Diagnosis not present

## 2019-09-29 DIAGNOSIS — Z8249 Family history of ischemic heart disease and other diseases of the circulatory system: Secondary | ICD-10-CM | POA: Insufficient documentation

## 2019-09-29 DIAGNOSIS — Z79899 Other long term (current) drug therapy: Secondary | ICD-10-CM | POA: Insufficient documentation

## 2019-09-29 DIAGNOSIS — E041 Nontoxic single thyroid nodule: Secondary | ICD-10-CM | POA: Insufficient documentation

## 2019-09-29 DIAGNOSIS — Z90721 Acquired absence of ovaries, unilateral: Secondary | ICD-10-CM | POA: Insufficient documentation

## 2019-09-29 DIAGNOSIS — I10 Essential (primary) hypertension: Secondary | ICD-10-CM | POA: Diagnosis not present

## 2019-09-29 DIAGNOSIS — M549 Dorsalgia, unspecified: Secondary | ICD-10-CM

## 2019-09-29 DIAGNOSIS — I82210 Acute embolism and thrombosis of superior vena cava: Secondary | ICD-10-CM | POA: Insufficient documentation

## 2019-09-29 DIAGNOSIS — Z8261 Family history of arthritis: Secondary | ICD-10-CM | POA: Diagnosis not present

## 2019-09-29 DIAGNOSIS — Z8349 Family history of other endocrine, nutritional and metabolic diseases: Secondary | ICD-10-CM | POA: Insufficient documentation

## 2019-09-29 DIAGNOSIS — F17210 Nicotine dependence, cigarettes, uncomplicated: Secondary | ICD-10-CM | POA: Diagnosis not present

## 2019-09-29 LAB — CBC WITH DIFFERENTIAL/PLATELET
Abs Immature Granulocytes: 0.01 10*3/uL (ref 0.00–0.07)
Basophils Absolute: 0.1 10*3/uL (ref 0.0–0.1)
Basophils Relative: 1 %
Eosinophils Absolute: 0.5 10*3/uL (ref 0.0–0.5)
Eosinophils Relative: 10 %
HCT: 38.6 % (ref 36.0–46.0)
Hemoglobin: 13.3 g/dL (ref 12.0–15.0)
Immature Granulocytes: 0 %
Lymphocytes Relative: 30 %
Lymphs Abs: 1.5 10*3/uL (ref 0.7–4.0)
MCH: 35.5 pg — ABNORMAL HIGH (ref 26.0–34.0)
MCHC: 34.5 g/dL (ref 30.0–36.0)
MCV: 102.9 fL — ABNORMAL HIGH (ref 80.0–100.0)
Monocytes Absolute: 0.4 10*3/uL (ref 0.1–1.0)
Monocytes Relative: 8 %
Neutro Abs: 2.6 10*3/uL (ref 1.7–7.7)
Neutrophils Relative %: 51 %
Platelets: 291 10*3/uL (ref 150–400)
RBC: 3.75 MIL/uL — ABNORMAL LOW (ref 3.87–5.11)
RDW: 12.7 % (ref 11.5–15.5)
WBC: 5.1 10*3/uL (ref 4.0–10.5)
nRBC: 0 % (ref 0.0–0.2)

## 2019-09-29 LAB — COMPREHENSIVE METABOLIC PANEL
ALT: 19 U/L (ref 0–44)
AST: 19 U/L (ref 15–41)
Albumin: 4 g/dL (ref 3.5–5.0)
Alkaline Phosphatase: 91 U/L (ref 38–126)
Anion gap: 9 (ref 5–15)
BUN: 7 mg/dL — ABNORMAL LOW (ref 8–23)
CO2: 27 mmol/L (ref 22–32)
Calcium: 8.7 mg/dL — ABNORMAL LOW (ref 8.9–10.3)
Chloride: 104 mmol/L (ref 98–111)
Creatinine, Ser: 0.61 mg/dL (ref 0.44–1.00)
GFR calc Af Amer: >60 mL/min (ref >60)
GFR calc non Af Amer: >60 mL/min (ref >60)
Glucose, Bld: 133 mg/dL — ABNORMAL HIGH (ref 70–99)
Potassium: 3.6 mmol/L (ref 3.5–5.1)
Sodium: 140 mmol/L (ref 135–145)
Total Bilirubin: 0.5 mg/dL (ref 0.3–1.2)
Total Protein: 7.1 g/dL (ref 6.5–8.1)

## 2019-09-29 NOTE — Progress Notes (Signed)
Hematology/Oncology follow up note [facility] [phone] [phone]   Patient Care Team: Sharyne Peach, MD as PCP - General (Family Medicine) Earlie Server, MD as Medical Oncologist (Medical Oncology) Noreene Filbert, MD as Referring Physician (Radiation Oncology) Algernon Huxley, MD as Referring Physician (Vascular Surgery) Telford Nab, RN as Registered Nurse  REASON FOR VISIT Follow-up for evaluation prior to immunotherapy for stage IIIA non-small cell lung cancer-adenocarcinoma   HISTORY OF PRESENTING ILLNESS:  Connie Powers is a  69 y.o.  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  CT lung cancer screen 05/27/2017   1. A few bilateral pulmonary nodules measuring up to 5 mm are indeterminate. Considering the suspicious 2.0 cm mediastinal adenopathy, follow up with PET/CT is recommended. 2. Mild bronchial wall thickening could represent airway disease including bronchitis.ACR Lung-RADS Category and Recommendation*: ACR Lung-RADS Category 2S (S modifier for mediastinal adenopathy)  PET scan 3/112019 1. No hypermetabolic pulmonary nodules.2. Hypermetabolic enlarged right paratracheal lymph node, of uncertain etiology in isolation. Lymphoproliferative disorder cannot be excluded. 3.  Aortic atherosclerosis (ICD10-170.0).  EBUS biopsy of paratracheal node showed non small cell lung cancer favoring adenocarcinoma. Case was discussed on tumor board on 09/16/2017 and consensus recommendation is to treat as Stage IIIA disease given the invasion of mediastinum.   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 MRI brain is contraindicated. CT brain negative for metastatic disease.  During the interval patient also was referred to vascular surgery for evaluation of malposition of Medi port.  Patient's right jugular port was removed and right internal jugular  vein Mediport was placed by Dr.Dew on 8/5 2019.  # CT chest scan done 06/02/2018 which was independent reviewed by me. CT chest showed radiation changes in the medial right upper lobe/paramediastinal region.  No evidence of recurrent or metastatic disease. Nonocclusive pericatheter thrombus/fibrin sheath along patient's right chest port. 06/06/2018 US venous upper right extremity ultrasound showed nonocclusive thrombus in the right jugular vein.  The port catheter can be seen associated with this thrombus.  The jugular vein is partially occlusive.  There is also partially occlusive thrombus in the right subclavian vein.  The right axillary, brachial, radial, and ulnar veins are compressible.  Doppler analysis demonstrated phasicity of the Doppler waveform. Patient was started on Eliquis for anticoagulation.   Current Treatment S/p concurrent Chemotherapy [name] /taxol  Weekly x 6] and RT.  Started on maintenance durvalumab every 2 weeks on 12/27/2017. 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 She has stopped smoking. Finish 1 year of Durvalumab [ 26 cycles] on 7/13/ 2020/   INTERVAL HISTORY Connie Powers is a 69 y.o. female who has above history reviewed by me today presents for follow-up of stage III non-small cell lung cancer Patient reports new onset of lower back pain, for about a week. She recalls that she recently pushed her husband's wheelchair and it may attribute to her lower back pain. She has had lower back pain previously in the past. Continues to smoke a few cigarettes a day.    Review of Systems  Constitutional: Negative for appetite change, chills, fatigue and fever.  HENT:   Negative for hearing loss and voice change.   Eyes: Negative for eye problems.  Respiratory: Negative for chest tightness and cough.        Chronic cough  Cardiovascular: Negative for chest pain.  Gastrointestinal: Negative for  abdominal distention, abdominal pain and blood in  stool.  Endocrine: Negative for hot flashes.  Genitourinary: Negative for difficulty urinating and frequency.   Musculoskeletal: Positive for back pain. Negative for arthralgias.  Skin: Negative for itching and rash.  Neurological: Negative for extremity weakness.  Hematological: Negative for adenopathy.  Psychiatric/Behavioral: Negative for confusion.    MEDICAL HISTORY:  Past Medical History:  Diagnosis Date  . Brain aneurysm   . Difficult intubation   . Hypertension   . Non-small cell cancer of right lung ([place]) 09/22/2017   Chemo + Rad tx's.   . Viral meningitis     SURGICAL HISTORY: Past Surgical History:  Procedure Laterality Date  . ENDOBRONCHIAL ULTRASOUND N/A 09/09/2017   Procedure: ENDOBRONCHIAL ULTRASOUND;  Surgeon: Laverle Hobby, MD;  Location: [facility];  Service: Pulmonary;  Laterality: N/A;  . OOPHORECTOMY Left   . PORTA CATH INSERTION N/A 09/29/2017   Procedure: PORTA CATH INSERTION;  Surgeon: Algernon Huxley, MD;  Location: [place] [facility];  Service: Cardiovascular;  Laterality: N/A;  . PORTA CATH INSERTION N/A 01/03/2018   Procedure: PORTA CATH INSERTION;  Surgeon: Algernon Huxley, MD;  Location: [place] [facility];  Service: Cardiovascular;  Laterality: N/A;  . PORTA CATH REMOVAL N/A 01/02/2019   Procedure: PORTA CATH REMOVAL;  Surgeon: Algernon Huxley, MD;  Location: [place] [facility];  Service: Cardiovascular;  Laterality: N/A;  . surgical repair for brain tumor  1969   clips in head  . WISDOM TOOTH EXTRACTION      SOCIAL HISTORY: Social History   Socioeconomic History  . Marital status: Married    Spouse name: Not on file  . Number of children: Not on file  . Years of education: Not on file  . Highest education level: Not on file  Occupational History  . Not on file  Tobacco Use  . Smoking status: Former Smoker    Packs/day: 0.25    Quit date: 10/03/2017    Years since quitting: 1.9  . Smokeless tobacco: Never Used  Substance and Sexual  Activity  . Alcohol use: Not Currently    Comment: social  . Drug use: Not Currently    Types: Cocaine  . Sexual activity: Not on file  Other Topics Concern  . Not on file  Social History Narrative  . Not on file   Social Determinants of Health   Financial Resource Strain:   . Difficulty of Paying Living Expenses:   Food Insecurity:   . Worried About Charity fundraiser in the Last Year:   . Arboriculturist in the Last Year:   Transportation Needs:   . Film/video editor (Medical):   Marland Kitchen Lack of Transportation (Non-Medical):   Physical Activity:   . Days of Exercise per Week:   . Minutes of Exercise per Session:   Stress:   . Feeling of Stress :   Social Connections:   . Frequency of Communication with Friends and Family:   . Frequency of Social Gatherings with Friends and Family:   . Attends Religious Services:   . Active Member of Clubs or Organizations:   . Attends Archivist Meetings:   Marland Kitchen Marital Status:   Intimate Partner Violence:   . Fear of Current or Ex-Partner:   . Emotionally Abused:   Marland Kitchen Physically Abused:   . Sexually Abused:     FAMILY HISTORY: Family History  Problem Relation Age of Onset  . Diabetes Mother   .  Hypertension Mother   . Hyperlipidemia Mother   . Arthritis Mother   . Hypertension Father   . Hyperlipidemia Father   . Heart attack Father        x2  . Breast cancer Neg Hx     ALLERGIES:  has No Known Allergies.  MEDICATIONS:  Current Outpatient Medications  Medication Sig Dispense Refill  . albuterol (VENTOLIN HFA) 108 (90 Base) MCG/ACT inhaler Inhale into the lungs.    Marland Kitchen amLODipine (NORVASC) 10 MG tablet Take 10 mg by mouth daily.  11  . Cyanocobalamin (B-12) 1000 MCG CAPS Take 1,000 mcg by mouth daily. 30 capsule 3  . docusate sodium (COLACE) 100 MG capsule Take 100 mg by mouth daily.    Marland Kitchen umeclidinium-vilanterol (ANORO ELLIPTA) 62.5-25 MCG/INH AEPB Inhale 1 puff into the lungs daily as needed (shortness of breath).       No current facility-administered medications for this visit.     PHYSICAL EXAMINATION: ECOG PERFORMANCE STATUS: 0 - Asymptomatic Vitals:   09/29/19 1021  BP: 131/78  Pulse: 81  Resp: 18  Temp: (!) 96.3 F (35.7 C)   Filed Weights   09/29/19 1021  Weight: 193 lb 12.8 oz (87.9 kg)    Physical Exam Constitutional:      General: She is not in acute distress. HENT:     Head: Normocephalic and atraumatic.  Eyes:     General: No scleral icterus.       Left eye: No discharge.     Conjunctiva/sclera: Conjunctivae normal.     Pupils: Pupils are equal, round, and reactive to light.  Cardiovascular:     Rate and Rhythm: Normal rate and regular rhythm.     Heart sounds: Normal heart sounds. No murmur.  Pulmonary:     Effort: Pulmonary effort is normal. No respiratory distress.     Breath sounds: Normal breath sounds. No wheezing or rales.  Chest:     Chest wall: No tenderness.  Abdominal:     General: Bowel sounds are normal. There is no distension.     Palpations: Abdomen is soft. There is no mass.  Musculoskeletal:        General: No deformity. Normal range of motion.     Cervical back: Normal range of motion and neck supple.  Lymphadenopathy:     Cervical: No cervical adenopathy.  Skin:    General: Skin is warm and dry.     Findings: No erythema or rash.     Comments: Port was discontinued..  Neurological:     Mental Status: She is alert and oriented to person, place, and time. Mental status is at baseline.     Cranial Nerves: No cranial nerve deficit.     Coordination: Coordination normal.  Psychiatric:        Mood and Affect: Mood normal.      LABORATORY DATA:  I have reviewed the data as listed Lab Results  Component Value Date   WBC 5.1 09/29/2019   HGB 13.3 09/29/2019   HCT 38.6 09/29/2019   MCV 102.9 (H) 09/29/2019   PLT 291 09/29/2019   Recent Labs    03/27/19 0951 03/27/19 0951 06/28/19 1314 09/26/19 1438 09/29/19 1002  NA 137  --  138   --  140  K 3.4*  --  3.7  --  3.6  CL 106  --  106  --  104  CO2 25  --  24  --  27  GLUCOSE 120*  --  133*  --  133*  BUN 9  --  8  --  7*  CREATININE 0.58   < > 0.56 0.70 0.61  CALCIUM 8.7*  --  8.9  --  8.7*  GFRNONAA >60  --  >60  --  >60  GFRAA >60  --  >60  --  >60  PROT 6.8  --  7.3  --  7.1  ALBUMIN 3.8  --  4.1  --  4.0  AST 15  --  21  --  19  ALT 17  --  20  --  19  ALKPHOS 96  --  97  --  91  BILITOT 0.5  --  0.7  --  0.5   < > = values in this interval not displayed.   RADIOGRAPHIC STUDIES: I have personally reviewed the radiological images as listed and agreed with the findings in the report. 12/20/2017 Repeat CT scan showed interval decreased of disease. CT images were Independently reviewed by me and discussed with patient. New 24mm right lung lesion, indeterminate.  02/25/2018 CT chest with contrast, presumed evolutionary changes of radiation therapy in the upper right hemithorax.  Aortic atherosclerosis, emphysema. DG Lumbar Spine Complete  Result Date: 09/29/2019 CLINICAL DATA:  Back pain. EXAM: LUMBAR SPINE - COMPLETE 4+ VIEW COMPARISON:  No recent. FINDINGS: Paraspinal soft tissues are normal. Degenerative change lumbar spine with scoliosis. No acute bony abnormality identified. No evidence of fracture. Aortoiliac atherosclerotic vascular calcification. IMPRESSION: Degenerative changes lumbar spine with scoliosis. No acute abnormality identified. No evidence of fracture. Electronically Signed   By: Marcello Moores  Register   On: 09/29/2019 15:44   CT Chest W Contrast  Result Date: 09/27/2019 CLINICAL DATA:  Non-small cell lung cancer, last chemotherapy 12/19/2018. EXAM: CT CHEST WITH CONTRAST TECHNIQUE: Multidetector CT imaging of the chest was performed during intravenous contrast administration. CONTRAST:  70mL OMNIPAQUE IOHEXOL 300 MG/ML  SOLN COMPARISON:  06/23/2019 and 06/02/2018. FINDINGS: Cardiovascular: Atherosclerotic calcification of the aorta and aortic valve. Chronic  appearing thrombus within the low right internal jugular vein with occlusion of the superior vena cava and collateral reconstitution. Findings appear minimally progressive from 06/02/2018. Heart is at the upper limits of normal in size. No pericardial effusion. Mediastinum/Nodes: Subcentimeter low-attenuation nodule in the right thyroid. No follow-up recommended (ref: J Am Coll Radiol. 2015 Feb;12(2): 143-50).No pathologically enlarged mediastinal, hilar or axillary lymph nodes. Esophagus is grossly unremarkable. Lungs/Pleura: Mild centrilobular and paraseptal emphysema. Post treatment parenchymal retraction, bronchiectasis and volume loss in the medial right upper lobe. Mild central bronchiectasis. 5 mm left lower lobe nodule (3/94), stable. Improved/resolved bilateral lower lobe ground-glass. Lungs are otherwise clear. No pleural fluid. Airway is unremarkable. Upper Abdomen: Visualized portions of the liver, gallbladder, adrenal glands, kidneys, spleen, pancreas, stomach and bowel are unremarkable with exception of a small hiatal hernia. Upper abdominal lymph nodes are not enlarged by CT size criteria. Mild small bowel mesenteric haziness and nodularity, partially imaged and nonspecific. Musculoskeletal: Degenerative changes in the spine. No worrisome lytic or sclerotic lesions. IMPRESSION: 1. Post treatment changes in the right upper lobe. No evidence of recurrent or metastatic disease. 2. Stable left lower lobe nodule. 3. Improved/resolved bilateral lower lobe ground-glass. 4. 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 5.  Aortic atherosclerosis (ICD10-I70.0). 6.  Emphysema (ICD10-J43.9). Electronically Signed   By: Lorin Picket M.D.   On: 09/27/2019 10:40     ASSESSMENT & PLAN:  Cancer  Staging Non-small cell lung cancer [facility]) Staging form: Lung, AJCC 8th Edition - Clinical stage from 09/22/2017: Stage Unknown (cTX,  cN1, cM0) - Signed by Earlie Server, MD on 09/22/2017 - Pathologic: No stage assigned - Unsigned  1. Back pain, unspecified back location, unspecified back pain laterality, unspecified chronicity   2. Non-small cell cancer of right lung ([facility])    #Clinical stage IIIA lung NSCLC, favoring adenocarcinoma 09/21/2019 CT scan was independently reviewed by me and discussed with patient. No evidence of recurrent or metastatic disease.  Stable left lung nodule. Continue surveillance.  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 There are was collateral reconstitution. Patient has been off anticoagulation since the Mediport was moved. Monitored.  #New lower back pain, degenerative disease versus metastasis. Obtain lumbar spine x-ray. Advised patient to continue monitor her symptoms.  If symptoms are getting worse or not better.  I will obtain MRI for further evaluation.  #Tobacco abuse, she continues to smoke  . Smoke cessation was discussed.   RTC 4 months after CT surveillance scan. Orders Placed This Encounter  Procedures  . DG Lumbar Spine Complete    Standing Status:   Future    Number of Occurrences:   1    Standing Expiration Date:   09/28/2020    Order Specific Question:   Reason for Exam (SYMPTOM  OR DIAGNOSIS REQUIRED)    Answer:   back pain    Order Specific Question:   Preferred imaging location?    Answer:   [facility]    Order Specific Question:   Radiology Contrast Protocol - do NOT remove file path    Answer:   \\[email or web address]  . CT Chest Wo Contrast    Standing Status:   Future    Standing Expiration Date:   09/28/2020    Order Specific Question:   ** REASON FOR EXAM (FREE TEXT)    Answer:   lung cancer follow up    Order Specific Question:   Preferred imaging location?    Answer:   [facility]    Order Specific Question:   Radiology Contrast Protocol -  do NOT remove file path    Answer:   \\[email or web address]    Earlie Server, MD, PhD

## 2019-09-29 NOTE — Progress Notes (Signed)
Patient reports new low back pain, 5/10 pain scale, for the past 5 days.

## 2019-11-22 ENCOUNTER — Other Ambulatory Visit: Payer: Self-pay

## 2019-11-22 ENCOUNTER — Ambulatory Visit
Admission: RE | Admit: 2019-11-22 | Discharge: 2019-11-22 | Disposition: A | Discharge status: Home / Self Care | Payer: Medicare HMO | Source: Ambulatory Visit | Attending: Radiation Oncology | Admitting: Radiation Oncology

## 2019-11-22 ENCOUNTER — Encounter: Payer: Self-pay | Admitting: Radiation Oncology

## 2019-11-22 VITALS — BP 139/82 | HR 90 | Temp 97.7°F | Resp 16 | Wt 187.2 lb

## 2019-11-22 DIAGNOSIS — Z9221 Personal history of antineoplastic chemotherapy: Secondary | ICD-10-CM | POA: Diagnosis not present

## 2019-11-22 DIAGNOSIS — M549 Dorsalgia, unspecified: Secondary | ICD-10-CM | POA: Diagnosis not present

## 2019-11-22 DIAGNOSIS — Z923 Personal history of irradiation: Secondary | ICD-10-CM | POA: Diagnosis not present

## 2019-11-22 DIAGNOSIS — C3491 Malignant neoplasm of unspecified part of right bronchus or lung: Secondary | ICD-10-CM | POA: Diagnosis not present

## 2019-11-22 NOTE — Progress Notes (Signed)
Radiation Oncology Follow up Note  Name: Connie Powers   Date:   11/22/2019 MRN:  [id number] DOB: [birth year]    This 69 y.o. female presents to the clinic today for 2-year follow-up status post concurrent chemoradiation therapy for stage IIIa (T4 N0 M0) adenocarcinoma the right lung.  REFERRING PROVIDER: Sharyne Peach, MD  HPI: Patient is a 69 year old female now out 2 years having completed concurrent chemoradiation therapy for stage IIIa adenocarcinoma the right lung.  Seen today in routine follow-up she is doing well.  She specifically [place] cough hemoptysis or chest tightness..  She had a CT scan back in April showing posttreatment changes in the right upper lobe no evidence of recurrent progressive or metastatic disease.  She also was having back pain and back in April had an lumbar MRI scan showing degenerative changes with scoliosis no evidence of fracture or metastatic disease.  COMPLICATIONS OF TREATMENT: none  FOLLOW UP COMPLIANCE: keeps appointments   PHYSICAL EXAM:  BP 139/82 (BP Location: Left Arm, Patient Position: Sitting, Cuff Size: Normal)   Pulse 90   Temp 97.7 F (36.5 C) (Tympanic)   Resp 16   Wt 187 lb 3.2 oz (84.9 kg)   BMI 32.13 kg/m  Well-developed well-nourished patient in NAD. HEENT reveals PERLA, EOMI, discs not visualized.  Oral cavity is clear. No oral mucosal lesions are identified. Neck is clear without evidence of cervical or supraclavicular adenopathy. Lungs are clear to A&P. Cardiac examination is essentially unremarkable with regular rate and rhythm without murmur rub or thrill. Abdomen is benign with no organomegaly or masses noted. Motor sensory and DTR levels are equal and symmetric in the upper and lower extremities. Cranial nerves II through XII are grossly intact. Proprioception is intact. No peripheral adenopathy or edema is identified. No motor or sensory levels are noted. Crude visual fields are within normal range.  RADIOLOGY RESULTS:  Chest CT scan and MRI of lumbar spine reviewed both compatible with above-stated findings  PLAN: Present time patient is doing well 2 years out with no evidence of disease.  I am pleased with her overall progress.  I have asked to see her back in 1 year for follow-up.  She continues close follow-up care with medical oncology.  Patient knows to call with any concerns at any time.  I would like to take this opportunity to thank you for allowing me to participate in the care of your patient.Noreene Filbert, MD

## 2020-01-25 ENCOUNTER — Other Ambulatory Visit: Payer: Self-pay

## 2020-01-25 ENCOUNTER — Ambulatory Visit
Admission: RE | Admit: 2020-01-25 | Discharge: 2020-01-25 | Disposition: A | Discharge status: Home / Self Care | Payer: Medicare HMO | Source: Ambulatory Visit | Attending: Oncology | Admitting: Oncology

## 2020-01-25 DIAGNOSIS — C3491 Malignant neoplasm of unspecified part of right bronchus or lung: Secondary | ICD-10-CM | POA: Diagnosis present

## 2020-01-26 ENCOUNTER — Other Ambulatory Visit: Payer: Self-pay

## 2020-01-26 DIAGNOSIS — C3491 Malignant neoplasm of unspecified part of right bronchus or lung: Secondary | ICD-10-CM

## 2020-01-29 ENCOUNTER — Other Ambulatory Visit: Payer: Self-pay

## 2020-01-29 ENCOUNTER — Encounter: Payer: Self-pay | Admitting: Oncology

## 2020-01-29 ENCOUNTER — Inpatient Hospital Stay: Payer: Medicare HMO | Attending: Oncology | Admitting: Oncology

## 2020-01-29 ENCOUNTER — Inpatient Hospital Stay: Payer: Medicare HMO

## 2020-01-29 VITALS — BP 132/83 | HR 90 | Temp 97.7°F | Resp 18 | Wt 185.2 lb

## 2020-01-29 DIAGNOSIS — Z833 Family history of diabetes mellitus: Secondary | ICD-10-CM | POA: Diagnosis not present

## 2020-01-29 DIAGNOSIS — Z9221 Personal history of antineoplastic chemotherapy: Secondary | ICD-10-CM | POA: Diagnosis not present

## 2020-01-29 DIAGNOSIS — C3491 Malignant neoplasm of unspecified part of right bronchus or lung: Secondary | ICD-10-CM

## 2020-01-29 DIAGNOSIS — Z7901 Long term (current) use of anticoagulants: Secondary | ICD-10-CM | POA: Insufficient documentation

## 2020-01-29 DIAGNOSIS — J470 Bronchiectasis with acute lower respiratory infection: Secondary | ICD-10-CM | POA: Diagnosis not present

## 2020-01-29 DIAGNOSIS — Z8261 Family history of arthritis: Secondary | ICD-10-CM | POA: Diagnosis not present

## 2020-01-29 DIAGNOSIS — Z79899 Other long term (current) drug therapy: Secondary | ICD-10-CM | POA: Diagnosis not present

## 2020-01-29 DIAGNOSIS — Z8249 Family history of ischemic heart disease and other diseases of the circulatory system: Secondary | ICD-10-CM | POA: Diagnosis not present

## 2020-01-29 DIAGNOSIS — I10 Essential (primary) hypertension: Secondary | ICD-10-CM | POA: Diagnosis not present

## 2020-01-29 DIAGNOSIS — Z87891 Personal history of nicotine dependence: Secondary | ICD-10-CM | POA: Insufficient documentation

## 2020-01-29 DIAGNOSIS — Z85118 Personal history of other malignant neoplasm of bronchus and lung: Secondary | ICD-10-CM | POA: Insufficient documentation

## 2020-01-29 DIAGNOSIS — J40 Bronchitis, not specified as acute or chronic: Secondary | ICD-10-CM | POA: Diagnosis not present

## 2020-01-29 DIAGNOSIS — Z720 Tobacco use: Secondary | ICD-10-CM

## 2020-01-29 DIAGNOSIS — Z8349 Family history of other endocrine, nutritional and metabolic diseases: Secondary | ICD-10-CM | POA: Insufficient documentation

## 2020-01-29 LAB — COMPREHENSIVE METABOLIC PANEL
ALT: 14 U/L (ref 0–44)
AST: 14 U/L — ABNORMAL LOW (ref 15–41)
Albumin: 3.9 g/dL (ref 3.5–5.0)
Alkaline Phosphatase: 83 U/L (ref 38–126)
Anion gap: 9 (ref 5–15)
BUN: 7 mg/dL — ABNORMAL LOW (ref 8–23)
CO2: 25 mmol/L (ref 22–32)
Calcium: 8.6 mg/dL — ABNORMAL LOW (ref 8.9–10.3)
Chloride: 105 mmol/L (ref 98–111)
Creatinine, Ser: 0.56 mg/dL (ref 0.44–1.00)
GFR calc Af Amer: >60 mL/min (ref >60)
GFR calc non Af Amer: >60 mL/min (ref >60)
Glucose, Bld: 121 mg/dL — ABNORMAL HIGH (ref 70–99)
Potassium: 3.8 mmol/L (ref 3.5–5.1)
Sodium: 139 mmol/L (ref 135–145)
Total Bilirubin: 0.5 mg/dL (ref 0.3–1.2)
Total Protein: 7.2 g/dL (ref 6.5–8.1)

## 2020-01-29 LAB — CBC WITH DIFFERENTIAL/PLATELET
Abs Immature Granulocytes: 0.01 10*3/uL (ref 0.00–0.07)
Basophils Absolute: 0.1 10*3/uL (ref 0.0–0.1)
Basophils Relative: 1 %
Eosinophils Absolute: 0.5 10*3/uL (ref 0.0–0.5)
Eosinophils Relative: 10 %
HCT: 38.9 % (ref 36.0–46.0)
Hemoglobin: 13.6 g/dL (ref 12.0–15.0)
Immature Granulocytes: 0 %
Lymphocytes Relative: 29 %
Lymphs Abs: 1.6 10*3/uL (ref 0.7–4.0)
MCH: 34.7 pg — ABNORMAL HIGH (ref 26.0–34.0)
MCHC: 35 g/dL (ref 30.0–36.0)
MCV: 99.2 fL (ref 80.0–100.0)
Monocytes Absolute: 0.5 10*3/uL (ref 0.1–1.0)
Monocytes Relative: 8 %
Neutro Abs: 3 10*3/uL (ref 1.7–7.7)
Neutrophils Relative %: 52 %
Platelets: 311 10*3/uL (ref 150–400)
RBC: 3.92 MIL/uL (ref 3.87–5.11)
RDW: 12.5 % (ref 11.5–15.5)
WBC: 5.6 10*3/uL (ref 4.0–10.5)
nRBC: 0 % (ref 0.0–0.2)

## 2020-01-29 MED ORDER — AZITHROMYCIN 250 MG PO TABS: ORAL_TABLET | ORAL | 0 refills | Status: DC

## 2020-01-29 NOTE — Progress Notes (Signed)
Hematology/Oncology follow up note [facility] [phone] [phone]   Patient Care Team: Sharyne Peach, MD as PCP - General (Family Medicine) Earlie Server, MD as Medical Oncologist (Medical Oncology) Noreene Filbert, MD as Referring Physician (Radiation Oncology) Algernon Huxley, MD as Referring Physician (Vascular Surgery) Telford Nab, RN as Registered Nurse  REASON FOR VISIT Follow-up for evaluation prior to immunotherapy for stage IIIA non-small cell lung cancer-adenocarcinoma   HISTORY OF PRESENTING ILLNESS:  Connie Powers is a  69 y.o.  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  CT lung cancer screen 05/27/2017   1. A few bilateral pulmonary nodules measuring up to 5 mm are indeterminate. Considering the suspicious 2.0 cm mediastinal adenopathy, follow up with PET/CT is recommended. 2. Mild bronchial wall thickening could represent airway disease including bronchitis.ACR Lung-RADS Category and Recommendation*: ACR Lung-RADS Category 2S (S modifier for mediastinal adenopathy)  PET scan 3/112019 1. No hypermetabolic pulmonary nodules.2. Hypermetabolic enlarged right paratracheal lymph node, of uncertain etiology in isolation. Lymphoproliferative disorder cannot be excluded. 3.  Aortic atherosclerosis (ICD10-170.0).  EBUS biopsy of paratracheal node showed non small cell lung cancer favoring adenocarcinoma. Case was discussed on tumor board on 09/16/2017 and consensus recommendation is to treat as Stage IIIA disease given the invasion of mediastinum.   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 MRI brain is contraindicated. CT brain negative for metastatic disease.  During the interval patient also was referred to vascular surgery for evaluation of malposition of Medi port.  Patient's right jugular port was removed and right internal jugular  vein Mediport was placed by Dr.Dew on 8/5 2019.  # CT chest scan done 06/02/2018 which was independent reviewed by me. CT chest showed radiation changes in the medial right upper lobe/paramediastinal region.  No evidence of recurrent or metastatic disease. Nonocclusive pericatheter thrombus/fibrin sheath along patient's right chest port. 06/06/2018 US venous upper right extremity ultrasound showed nonocclusive thrombus in the right jugular vein.  The port catheter can be seen associated with this thrombus.  The jugular vein is partially occlusive.  There is also partially occlusive thrombus in the right subclavian vein.  The right axillary, brachial, radial, and ulnar veins are compressible.  Doppler analysis demonstrated phasicity of the Doppler waveform. Patient was started on Eliquis for anticoagulation.   Current Treatment S/p concurrent Chemotherapy [name] /taxol  Weekly x 6] and RT.  Started on maintenance durvalumab every 2 weeks on 12/27/2017. 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 She has stopped smoking. Finish 1 year of Durvalumab [ 26 cycles] on 7/13/ 2020/   INTERVAL HISTORY Connie Powers is a 69 y.o. female who has above history reviewed by me today presents for follow-up of stage III non-small cell lung cancer Patient has had interval CT scan. She reports new onset of cough with greenish sputum 2 to 3 weeks ago.  Cough has improved.  She still cough up greenish sputum.  No fever or chills.  No shortness of breath. She still smokes 3 cigarettes daily.    Review of Systems  Constitutional: Negative for appetite change, chills, fatigue and fever.  HENT:   Negative for hearing loss and voice change.   Eyes: Negative for eye problems.  Respiratory: Negative for chest tightness and cough.        Chronic cough  Cardiovascular: Negative for chest pain.  Gastrointestinal: Negative for abdominal  distention, abdominal pain and blood in stool.  Endocrine:  Negative for hot flashes.  Genitourinary: Negative for difficulty urinating and frequency.   Musculoskeletal: Negative for arthralgias.  Skin: Negative for itching and rash.  Neurological: Negative for extremity weakness.  Hematological: Negative for adenopathy.  Psychiatric/Behavioral: Negative for confusion.    MEDICAL HISTORY:  Past Medical History:  Diagnosis Date  . Brain aneurysm   . Difficult intubation   . Hypertension   . Non-small cell cancer of right lung ([place]) 09/22/2017   Chemo + Rad tx's.   . Viral meningitis     SURGICAL HISTORY: Past Surgical History:  Procedure Laterality Date  . ENDOBRONCHIAL ULTRASOUND N/A 09/09/2017   Procedure: ENDOBRONCHIAL ULTRASOUND;  Surgeon: Laverle Hobby, MD;  Location: [facility] OR[facility];  Service: Pulmonary;  Laterality: N/A;  . OOPHORECTOMY Left   . PORTA CATH INSERTION N/A 09/29/2017   Procedure: PORTA CATH INSERTION;  Surgeon: Algernon Huxley, MD;  Location: [facility];  Service: Cardiovascular;  Laterality: N/A;  . PORTA CATH INSERTION N/A 01/03/2018   Procedure: PORTA CATH INSERTION;  Surgeon: Algernon Huxley, MD;  Location: [place] [facility];  Service: Cardiovascular;  Laterality: N/A;  . PORTA CATH REMOVAL N/A 01/02/2019   Procedure: PORTA CATH REMOVAL;  Surgeon: Algernon Huxley, MD;  Location: [facility] [facility];  Service: Cardiovascular;  Laterality: N/A;  . surgical repair for brain tumor  1969   clips in head  . WISDOM TOOTH EXTRACTION      SOCIAL HISTORY: Social History   Socioeconomic History  . Marital status: Married    Spouse name: Not on file  . Number of children: Not on file  . Years of education: Not on file  . Highest education level: Not on file  Occupational History  . Not on file  Tobacco Use  . Smoking status: Former Smoker    Packs/day: 0.25    Quit date: 10/03/2017    Years since quitting: 2.3  . Smokeless tobacco: Never Used  Vaping Use  . Vaping Use: Never used  Substance and Sexual Activity    . Alcohol use: Not Currently    Comment: social  . Drug use: Not Currently    Types: Cocaine  . Sexual activity: Not on file  Other Topics Concern  . Not on file  Social History Narrative  . Not on file   Social Determinants of Health   Financial Resource Strain:   . Difficulty of Paying Living Expenses: Not on file  Food Insecurity:   . Worried About Charity fundraiser in the Last Year: Not on file  . Ran Out of Food in the Last Year: Not on file  Transportation Needs:   . Lack of Transportation (Medical): Not on file  . Lack of Transportation (Non-Medical): Not on file  Physical Activity:   . Days of Exercise per Week: Not on file  . Minutes of Exercise per Session: Not on file  Stress:   . Feeling of Stress : Not on file  Social Connections:   . Frequency of Communication with Friends and Family: Not on file  . Frequency of Social Gatherings with Friends and Family: Not on file  . Attends Religious Services: Not on file  . Active Member of Clubs or Organizations: Not on file  . Attends Archivist Meetings: Not on file  . Marital Status: Not on file  Intimate Partner Violence:   . Fear of Current or Ex-Partner: Not on file  .  Emotionally Abused: Not on file  . Physically Abused: Not on file  . Sexually Abused: Not on file    FAMILY HISTORY: Family History  Problem Relation Age of Onset  . Diabetes Mother   . Hypertension Mother   . Hyperlipidemia Mother   . Arthritis Mother   . Hypertension Father   . Hyperlipidemia Father   . Heart attack Father        x2  . Breast cancer Neg Hx     ALLERGIES:  has No Known Allergies.  MEDICATIONS:  Current Outpatient Medications  Medication Sig Dispense Refill  . albuterol (VENTOLIN HFA) 108 (90 Base) MCG/ACT inhaler Inhale into the lungs.    Marland Kitchen amLODipine (NORVASC) 10 MG tablet Take 10 mg by mouth daily.  11  . Cyanocobalamin (B-12) 1000 MCG CAPS Take 1,000 mcg by mouth daily. 30 capsule 3  . docusate  sodium (COLACE) 100 MG capsule Take 100 mg by mouth daily.    Marland Kitchen umeclidinium-vilanterol (ANORO ELLIPTA) 62.5-25 MCG/INH AEPB Inhale 1 puff into the lungs daily as needed (shortness of breath).     Marland Kitchen azithromycin (ZITHROMAX) 250 MG tablet 2 tablet (500 mg) once, then 1 tab (250 mg) daily x4 days 6 each 0   No current facility-administered medications for this visit.     PHYSICAL EXAMINATION: ECOG PERFORMANCE STATUS: 0 - Asymptomatic Vitals:   01/29/20 1027  BP: 132/83  Pulse: 90  Resp: 18  Temp: 97.7 F (36.5 C)  SpO2: 94%   Filed Weights   01/29/20 1027  Weight: 185 lb 3.2 oz (84 kg)    Physical Exam Constitutional:      General: She is not in acute distress. HENT:     Head: Normocephalic and atraumatic.  Eyes:     General: No scleral icterus.       Left eye: No discharge.     Conjunctiva/sclera: Conjunctivae normal.     Pupils: Pupils are equal, round, and reactive to light.  Cardiovascular:     Rate and Rhythm: Normal rate and regular rhythm.     Heart sounds: Normal heart sounds. No murmur heard.   Pulmonary:     Effort: Pulmonary effort is normal. No respiratory distress.     Breath sounds: Normal breath sounds. No wheezing or rales.  Chest:     Chest wall: No tenderness.  Abdominal:     General: Bowel sounds are normal. There is no distension.     Palpations: Abdomen is soft. There is no mass.  Musculoskeletal:        General: No deformity. Normal range of motion.     Cervical back: Normal range of motion and neck supple.  Lymphadenopathy:     Cervical: No cervical adenopathy.  Skin:    General: Skin is warm and dry.     Findings: No erythema or rash.  Neurological:     Mental Status: She is alert and oriented to person, place, and time. Mental status is at baseline.     Cranial Nerves: No cranial nerve deficit.     Coordination: Coordination normal.  Psychiatric:        Mood and Affect: Mood normal.      LABORATORY DATA:  I have reviewed the data  as listed Lab Results  Component Value Date   WBC 5.6 01/29/2020   HGB 13.6 01/29/2020   HCT 38.9 01/29/2020   MCV 99.2 01/29/2020   PLT 311 01/29/2020   Recent Labs    06/28/19 1314 06/28/19  1314 09/26/19 1438 09/29/19 1002 01/29/20 1004  NA 138  --   --  140 139  K 3.7  --   --  3.6 3.8  CL 106  --   --  104 105  CO2 24  --   --  27 25  GLUCOSE 133*  --   --  133* 121*  BUN 8  --   --  7* 7*  CREATININE 0.56   < > 0.70 0.61 0.56  CALCIUM 8.9  --   --  8.7* 8.6*  GFRNONAA >60  --   --  >60 >60  GFRAA >60  --   --  >60 >60  PROT 7.3  --   --  7.1 7.2  ALBUMIN 4.1  --   --  4.0 3.9  AST 21  --   --  19 14*  ALT 20  --   --  19 14  ALKPHOS 97  --   --  91 83  BILITOT 0.7  --   --  0.5 0.5   < > = values in this interval not displayed.   RADIOGRAPHIC STUDIES: I have personally reviewed the radiological images as listed and agreed with the findings in the report. 12/20/2017 Repeat CT scan showed interval decreased of disease. CT images were Independently reviewed by me and discussed with patient. New 56mm right lung lesion, indeterminate.  02/25/2018 CT chest with contrast, presumed evolutionary changes of radiation therapy in the upper right hemithorax.  Aortic atherosclerosis, emphysema. CT Chest Wo Contrast  Result Date: 01/25/2020 CLINICAL DATA:  Stage IIIA non-small cell lung cancer status post chemo radiation therapy and maintenance immunotherapy. Restaging. EXAM: CT CHEST WITHOUT CONTRAST TECHNIQUE: Multidetector CT imaging of the chest was performed following the standard protocol without IV contrast. COMPARISON:  09/26/2019 chest CT. FINDINGS: Cardiovascular: Normal heart size. No significant pericardial effusion/thickening. Atherosclerotic nonaneurysmal thoracic aorta. Top-normal caliber main pulmonary artery (3.3 cm diameter). Mediastinum/Nodes: No discrete thyroid nodules. Unremarkable esophagus. No pathologically enlarged axillary, mediastinal or hilar lymph nodes,  noting limited sensitivity for the detection of hilar adenopathy on this noncontrast study. Lungs/Pleura: No pneumothorax. No pleural effusion. Mild centrilobular and paraseptal emphysema. Sharply marginated right upper perihilar lung consolidation with associated mild bronchiectasis, volume loss and distortion, stable, compatible with radiation fibrosis. Solid posterior left lower lobe 0.5 cm pulmonary nodule (series 3/image 91), stable. New mild patchy consolidation, ground-glass opacity and tree-in-bud opacities in the left lower lobe and medial right lower lobe. No new significant pulmonary nodules. Upper abdomen: Small hiatal hernia. Musculoskeletal: No aggressive appearing focal osseous lesions. Mild thoracic spondylosis. IMPRESSION: 1. 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 No evidence of local tumor recurrence. 2. No findings highly suspicious for metastatic disease in the chest. 3. New mild patchy consolidation, ground-glass opacity and tree-in-bud opacities in the left lower lobe and medial right lower lobe, favor mild multilobar bronchopneumonia such as due to aspiration. Recommend attention on follow-up chest CT in 3 months. 4. Aortic Atherosclerosis (ICD10-I70.0) and Emphysema (ICD10-J43.9). Electronically Signed   By: Ilona Sorrel M.D.   On: 01/25/2020 12:56     ASSESSMENT & PLAN:  Cancer Staging Non-small cell lung cancer [facility]) Staging form: Lung, AJCC 8th Edition - Clinical stage from 09/22/2017: Stage Unknown (cTX, cN1, cM0) - Signed by Earlie Server, MD on 09/22/2017 - Pathologic: No stage assigned - Unsigned  1. Non-small cell cancer of right lung ([place])   2. Bronchitis   3. Tobacco abuse    #  Clinical stage IIIA lung NSCLC, favoring adenocarcinoma 01/25/2020, CT without contrast was independently reviewed by me and discussed with patient. 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  No evidence of local tumor recurrence.  No findings suspicious for  metastatic disease in the chest.  New mild patchy consolidation, groundglass opacity and tree-in-bud opacities.  In the left lower lobe and the medial right lower lobe.  Fever mild multilobar bronchopneumonia.  Attention on follow-up in 3 months.  Combining patient's recent history of increased coughing and greenish sputum production.  Likely bronchitis.  Recommend patient to complete a course of azithromycin.  Prescription was sent to pharmacy.  #Tobacco abuse, she continues to smoke daily.  Smoke cessation was discussed.  She declines being referred to smoke cessation clinic.  RTC 3 months after CT surveillance scan. Orders Placed This Encounter  Procedures  . CT Chest W Contrast    Standing Status:   Future    Standing Expiration Date:   01/28/2021    Order Specific Question:   If indicated for the ordered procedure, I authorize the administration of contrast media per Radiology protocol    Answer:   Yes    Order Specific Question:   Preferred imaging location?    Answer:   [facility]    Order Specific Question:   Radiology Contrast Protocol - do NOT remove file path    Answer:   [email or web address]  . CBC with Differential/Platelet    Standing Status:   Future    Standing Expiration Date:   01/28/2021  . Comprehensive metabolic panel    Standing Status:   Future    Standing Expiration Date:   01/28/2021    Earlie Server, MD, PhD

## 2020-01-29 NOTE — Progress Notes (Signed)
Pt here for follow up. No new concerns voiced.   

## 2020-04-30 ENCOUNTER — Other Ambulatory Visit: Payer: Self-pay

## 2020-04-30 ENCOUNTER — Inpatient Hospital Stay: Payer: Medicare HMO | Attending: Oncology

## 2020-04-30 ENCOUNTER — Ambulatory Visit
Admission: RE | Admit: 2020-04-30 | Discharge: 2020-04-30 | Disposition: A | Discharge status: Home / Self Care | Payer: Medicare HMO | Source: Ambulatory Visit | Attending: Oncology | Admitting: Oncology

## 2020-04-30 DIAGNOSIS — C3491 Malignant neoplasm of unspecified part of right bronchus or lung: Secondary | ICD-10-CM | POA: Diagnosis present

## 2020-04-30 LAB — POCT I-STAT CREATININE: Creatinine, Ser: 0.5 mg/dL (ref 0.44–1.00)

## 2020-04-30 MED ORDER — IOHEXOL 300 MG/ML  SOLN
75.0000 mL | Freq: Once | INTRAMUSCULAR | Status: AC | PRN
  Administered 2020-04-30: 75 mL via INTRAVENOUS

## 2020-05-02 ENCOUNTER — Inpatient Hospital Stay: Payer: Medicare HMO | Attending: Oncology | Admitting: Oncology

## 2020-05-02 ENCOUNTER — Other Ambulatory Visit: Payer: Self-pay

## 2020-05-02 ENCOUNTER — Encounter: Payer: Self-pay | Admitting: Oncology

## 2020-05-02 VITALS — BP 138/79 | HR 99 | Temp 97.9°F | Resp 18 | Wt 190.5 lb

## 2020-05-02 DIAGNOSIS — I10 Essential (primary) hypertension: Secondary | ICD-10-CM | POA: Insufficient documentation

## 2020-05-02 DIAGNOSIS — Z8261 Family history of arthritis: Secondary | ICD-10-CM | POA: Insufficient documentation

## 2020-05-02 DIAGNOSIS — J439 Emphysema, unspecified: Secondary | ICD-10-CM | POA: Insufficient documentation

## 2020-05-02 DIAGNOSIS — C3491 Malignant neoplasm of unspecified part of right bronchus or lung: Secondary | ICD-10-CM

## 2020-05-02 DIAGNOSIS — Z86718 Personal history of other venous thrombosis and embolism: Secondary | ICD-10-CM | POA: Insufficient documentation

## 2020-05-02 DIAGNOSIS — Z7901 Long term (current) use of anticoagulants: Secondary | ICD-10-CM | POA: Diagnosis not present

## 2020-05-02 DIAGNOSIS — Z90721 Acquired absence of ovaries, unilateral: Secondary | ICD-10-CM | POA: Insufficient documentation

## 2020-05-02 DIAGNOSIS — Z8349 Family history of other endocrine, nutritional and metabolic diseases: Secondary | ICD-10-CM | POA: Insufficient documentation

## 2020-05-02 DIAGNOSIS — Z923 Personal history of irradiation: Secondary | ICD-10-CM | POA: Diagnosis not present

## 2020-05-02 DIAGNOSIS — Z9221 Personal history of antineoplastic chemotherapy: Secondary | ICD-10-CM | POA: Insufficient documentation

## 2020-05-02 DIAGNOSIS — Z8249 Family history of ischemic heart disease and other diseases of the circulatory system: Secondary | ICD-10-CM | POA: Insufficient documentation

## 2020-05-02 DIAGNOSIS — Z720 Tobacco use: Secondary | ICD-10-CM

## 2020-05-02 DIAGNOSIS — Z833 Family history of diabetes mellitus: Secondary | ICD-10-CM | POA: Insufficient documentation

## 2020-05-02 DIAGNOSIS — Z85118 Personal history of other malignant neoplasm of bronchus and lung: Secondary | ICD-10-CM | POA: Insufficient documentation

## 2020-05-02 DIAGNOSIS — Z79899 Other long term (current) drug therapy: Secondary | ICD-10-CM | POA: Insufficient documentation

## 2020-05-02 DIAGNOSIS — Z87891 Personal history of nicotine dependence: Secondary | ICD-10-CM | POA: Diagnosis not present

## 2020-05-02 NOTE — Progress Notes (Signed)
Hematology/Oncology follow up note Connie Powers Telephone:([phone] Fax:(336) [phone]   Patient Care Team: Sharyne Peach, MD as PCP - General (Family Medicine) Earlie Server, MD as Medical Oncologist (Medical Oncology) Noreene Filbert, MD as Referring Physician (Radiation Oncology) Algernon Huxley, MD as Referring Physician (Vascular Surgery) Telford Nab, RN as Registered Nurse  REASON FOR VISIT Follow-up for evaluation prior to immunotherapy for stage IIIA non-small cell lung cancer-adenocarcinoma   HISTORY OF PRESENTING ILLNESS:  Connie Powers is a  69 y.o.  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  CT lung cancer screen 05/27/2017   1. A few bilateral pulmonary nodules measuring up to 5 mm are indeterminate. Considering the suspicious 2.0 cm mediastinal adenopathy, follow up with PET/CT is recommended. 2. Mild bronchial wall thickening could represent airway disease including bronchitis.ACR Lung-RADS Category and Recommendation*: ACR Lung-RADS Category 2S (S modifier for mediastinal adenopathy)  PET scan 3/112019 1. No hypermetabolic pulmonary nodules.2. Hypermetabolic enlarged right paratracheal lymph node, of uncertain etiology in isolation. Lymphoproliferative disorder cannot be excluded. 3.  Aortic atherosclerosis (ICD10-170.0).  EBUS biopsy of paratracheal node showed non small cell lung cancer favoring adenocarcinoma. Case was discussed on tumor board on 09/16/2017 and consensus recommendation is to treat as Stage IIIA disease given the invasion of mediastinum.   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 MRI brain is contraindicated. CT brain negative for metastatic disease.  During the interval patient also was referred to vascular surgery for evaluation of malposition of Medi port.  Patient's right jugular port was removed and right internal jugular  vein Mediport was placed by Dr.Dew on 8/5 2019.  # CT chest scan done 06/02/2018 which was independent reviewed by me. CT chest showed radiation changes in the medial right upper lobe/paramediastinal region.  No evidence of recurrent or metastatic disease. Nonocclusive pericatheter thrombus/fibrin sheath along patient's right chest port. 06/06/2018 US venous upper right extremity ultrasound showed nonocclusive thrombus in the right jugular vein.  The port catheter can be seen associated with this thrombus.  The jugular vein is partially occlusive.  There is also partially occlusive thrombus in the right subclavian vein.  The right axillary, brachial, radial, and ulnar veins are compressible.  Doppler analysis demonstrated phasicity of the Doppler waveform. Patient was started on Eliquis for anticoagulation.   Current Treatment S/p concurrent Chemotherapy [name] /taxol  Weekly x 6] and RT.  Started on maintenance durvalumab every 2 weeks on 12/27/2017. 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 She has stopped smoking. Finish 1 year of Durvalumab [ 26 cycles] on 7/13/ 2020/   INTERVAL HISTORY Connie Powers is a 69 y.o. female who has above history reviewed by me today presents for follow-up of stage III non-small cell lung cancer Patient has had interval CT scan.continues to smoke daily, 2 cigarettes.    Review of Systems  Constitutional: Negative for appetite change, chills, fatigue and fever.  HENT:   Negative for hearing loss and voice change.   Eyes: Negative for eye problems.  Respiratory: Negative for chest tightness and cough.        Chronic cough  Cardiovascular: Negative for chest pain.  Gastrointestinal: Negative for abdominal distention, abdominal pain and blood in stool.  Endocrine: Negative for hot flashes.  Genitourinary: Negative for difficulty urinating and frequency.   Musculoskeletal: Negative for arthralgias.  Skin: Negative for itching and rash.  Neurological: Negative for extremity weakness.  Hematological: Negative for adenopathy.  Psychiatric/Behavioral: Negative for confusion.    MEDICAL HISTORY:  Past Medical History:  Diagnosis Date  . Brain aneurysm   . Difficult intubation   . Hypertension   . Non-small cell cancer of right lung ([place]) 09/22/2017   Chemo + Rad tx's.   . Viral meningitis     SURGICAL HISTORY: Past Surgical History:  Procedure Laterality Date  . ENDOBRONCHIAL ULTRASOUND N/A 09/09/2017   Procedure: ENDOBRONCHIAL ULTRASOUND;  Surgeon: Laverle Hobby, MD;  Location: [facility];  Service: Pulmonary;  Laterality: N/A;  . OOPHORECTOMY Left   . PORTA CATH INSERTION N/A 09/29/2017   Procedure: PORTA CATH INSERTION;  Surgeon: Algernon Huxley, MD;  Location: [place] [facility];  Service: Cardiovascular;  Laterality: N/A;  . PORTA CATH INSERTION N/A 01/03/2018   Procedure: PORTA CATH INSERTION;  Surgeon: Algernon Huxley, MD;  Location: [place] [facility];  Service: Cardiovascular;  Laterality: N/A;  . PORTA CATH REMOVAL N/A 01/02/2019   Procedure: PORTA CATH REMOVAL;  Surgeon: Algernon Huxley, MD;  Location: [place] [facility];  Service: Cardiovascular;  Laterality: N/A;  . surgical repair for brain tumor  1969   clips in head  . WISDOM TOOTH EXTRACTION      SOCIAL HISTORY: Social History   Socioeconomic History  . Marital status: Married    Spouse name: Not on file  . Number of children: Not on file  . Years of education: Not on file  . Highest education level: Not on file  Occupational History  . Not on file  Tobacco Use  . Smoking status: Former Smoker    Packs/day: 0.25    Quit date: 10/03/2017    Years since quitting: 2.5  . Smokeless tobacco: Never Used  Vaping Use  . Vaping Use: Never used  Substance and Sexual Activity  . Alcohol use: Not Currently    Comment: social  . Drug use: Not Currently    Types: Cocaine  . Sexual activity: Not on file  Other Topics Concern  . Not on file  Social  History Narrative  . Not on file   Social Determinants of Health   Financial Resource Strain:   . Difficulty of Paying Living Expenses: Not on file  Food Insecurity:   . Worried About Charity fundraiser in the Last Year: Not on file  . Ran Out of Food in the Last Year: Not on file  Transportation Needs:   . Lack of Transportation (Medical): Not on file  . Lack of Transportation (Non-Medical): Not on file  Physical Activity:   . Days of Exercise per Week: Not on file  . Minutes of Exercise per Session: Not on file  Stress:   . Feeling of Stress : Not on file  Social Connections:   . Frequency of Communication with Friends and Family: Not on file  . Frequency of Social Gatherings with Friends and Family: Not on file  . Attends Religious Services: Not on file  . Active Member of Clubs or Organizations: Not on file  . Attends Archivist Meetings: Not on file  . Marital Status: Not on file  Intimate Partner Violence:   . Fear of Current or Ex-Partner: Not on file  . Emotionally Abused: Not on file  . Physically Abused: Not on file  . Sexually Abused: Not on file    FAMILY HISTORY: Family History  Problem Relation Age of Onset  . Diabetes  Mother   . Hypertension Mother   . Hyperlipidemia Mother   . Arthritis Mother   . Hypertension Father   . Hyperlipidemia Father   . Heart attack Father        x2  . Breast cancer Neg Hx     ALLERGIES:  has No Known Allergies.  MEDICATIONS:  Current Outpatient Medications  Medication Sig Dispense Refill  . albuterol (VENTOLIN HFA) 108 (90 Base) MCG/ACT inhaler Inhale into the lungs.    Marland Kitchen amLODipine (NORVASC) 10 MG tablet Take 10 mg by mouth daily.  11  . Cyanocobalamin (B-12) 1000 MCG CAPS Take 1,000 mcg by mouth daily. 30 capsule 3  . docusate sodium (COLACE) 100 MG capsule Take 100 mg by mouth daily.    Marland Kitchen umeclidinium-vilanterol (ANORO ELLIPTA) 62.5-25 MCG/INH AEPB Inhale 1 puff into the lungs daily as needed (shortness  of breath).     Marland Kitchen azithromycin (ZITHROMAX) 250 MG tablet 2 tablet (500 mg) once, then 1 tab (250 mg) daily x4 days (Patient not taking: Reported on 05/02/2020) 6 each 0   No current facility-administered medications for this visit.     PHYSICAL EXAMINATION: ECOG PERFORMANCE STATUS: 0 - Asymptomatic Vitals:   05/02/20 1018  BP: 138/79  Pulse: 99  Resp: 18  Temp: 97.9 F (36.6 C)   Filed Weights   05/02/20 1018  Weight: 190 lb 8 oz (86.4 kg)    Physical Exam Constitutional:      General: She is not in acute distress. HENT:     Head: Normocephalic and atraumatic.  Eyes:     General: No scleral icterus.       Left eye: No discharge.     Conjunctiva/sclera: Conjunctivae normal.     Pupils: Pupils are equal, round, and reactive to light.  Cardiovascular:     Rate and Rhythm: Normal rate and regular rhythm.     Heart sounds: Normal heart sounds. No murmur heard.   Pulmonary:     Effort: Pulmonary effort is normal. No respiratory distress.     Breath sounds: Normal breath sounds. No wheezing or rales.  Chest:     Chest wall: No tenderness.  Abdominal:     General: Bowel sounds are normal. There is no distension.     Palpations: Abdomen is soft. There is no mass.  Musculoskeletal:        General: No deformity. Normal range of motion.     Cervical back: Normal range of motion and neck supple.  Lymphadenopathy:     Cervical: No cervical adenopathy.  Skin:    General: Skin is warm and dry.     Findings: No erythema or rash.  Neurological:     Mental Status: She is alert and oriented to person, place, and time. Mental status is at baseline.     Cranial Nerves: No cranial nerve deficit.     Coordination: Coordination normal.  Psychiatric:        Mood and Affect: Mood normal.      LABORATORY DATA:  I have reviewed the data as listed Lab Results  Component Value Date   WBC 5.6 01/29/2020   HGB 13.6 01/29/2020   HCT 38.9 01/29/2020   MCV 99.2 01/29/2020   PLT 311  01/29/2020   Recent Labs    06/28/19 1314 09/26/19 1438 09/29/19 1002 01/29/20 1004 04/30/20 1111  NA 138  --  140 139  --   K 3.7  --  3.6 3.8  --   CL 106  --  104 105  --   CO2 24  --  27 25  --   GLUCOSE 133*  --  133* 121*  --   BUN 8  --  7* 7*  --   CREATININE 0.56   < > 0.61 0.56 0.50  CALCIUM 8.9  --  8.7* 8.6*  --   GFRNONAA >60  --  >60 >60  --   GFRAA >60  --  >60 >60  --   PROT 7.3  --  7.1 7.2  --   ALBUMIN 4.1  --  4.0 3.9  --   AST 21  --  19 14*  --   ALT 20  --  19 14  --   ALKPHOS 97  --  91 83  --   BILITOT 0.7  --  0.5 0.5  --    < > = values in this interval not displayed.   RADIOGRAPHIC STUDIES: I have personally reviewed the radiological images as listed and agreed with the findings in the report. 12/20/2017 Repeat CT scan showed interval decreased of disease. CT images were Independently reviewed by me and discussed with patient. New 10mm right lung lesion, indeterminate.  02/25/2018 CT chest with contrast, presumed evolutionary changes of radiation therapy in the upper right hemithorax.  Aortic atherosclerosis, emphysema. CT Chest W Contrast  Result Date: 04/30/2020 CLINICAL DATA:  Non-small-cell lung cancer.  Restaging. EXAM: CT CHEST WITH CONTRAST TECHNIQUE: Multidetector CT imaging of the chest was performed during intravenous contrast administration. CONTRAST:  50mL OMNIPAQUE IOHEXOL 300 MG/ML  SOLN COMPARISON:  09/26/2019.  01/25/2020. FINDINGS: Cardiovascular: The heart size is normal. No substantial pericardial effusion. Atherosclerotic calcification is noted in the wall of the thoracic aorta. Mediastinum/Nodes: Scattered small mediastinal lymph nodes are stable in the interval. Index 8 mm short axis subcarinal node measured previously is stable at 9 mm today. There is no hilar lymphadenopathy. The esophagus has normal imaging features. There is no axillary lymphadenopathy. Tiny hiatal hernia noted. Lungs/Pleura: Centrilobular emphsyema noted. Stable  appearance of radiation fibrosis in the suprahilar medial right upper lobe. 5 mm left lower lobe pulmonary nodule (87/3) is stable in the interval. No new suspicious pulmonary nodule or mass. Tree-in-bud nodularity seen in the lower lobes previously has resolved in the interval. No focal airspace consolidation. No pleural effusion. Upper Abdomen: Unremarkable. Musculoskeletal: No worrisome lytic or sclerotic osseous abnormality. IMPRESSION: 1. No new or progressive findings to suggest recurrent or metastatic disease. 2. Stable appearance of radiation fibrosis in the suprahilar medial right upper lobe. 3. Interval resolution of tree-in-bud nodularity seen in the lower lobes previously. 4. Stable 5 mm left lower lobe pulmonary nodule. 5. Aortic Atherosclerosis (ICD10-I70.0) and Emphysema (ICD10-J43.9). Electronically Signed   By: Misty Stanley M.D.   On: 04/30/2020 14:06     ASSESSMENT & PLAN:  Cancer Staging Non-small cell lung cancer [facility]) Staging form: Lung, AJCC 8th Edition - Clinical stage from 09/22/2017: Stage Unknown (cTX, cN1, cM0) - Signed by Earlie Server, MD on 09/22/2017 - Pathologic: No stage assigned - Unsigned  1. Non-small cell cancer of right lung ([place])   2. Tobacco abuse    #Clinical stage IIIA lung NSCLC, favoring adenocarcinoma 04/30/20 CT chest wo contrast  1. No new or progressive findings to suggest recurrent or metastatic disease. 2. Stable appearance of radiation fibrosis in the suprahilar medial right upper lobe. 3. Interval resolution of tree-in-bud nodularity seen in the lower lobes previously. 4. Stable 5 mm left lower lobe pulmonary nodule.5.  Aortic Atherosclerosis (ICD10-I70.0) and Emphysema   It has been 2.5 years after she finished concurrent chemo and RT.  Recommend her to continue surveillence CT scan Q6 months.   #Tobacco abuse, she continues to smoke daily.   RTC 6 months after CT surveillance scan. Orders Placed This Encounter  Procedures  . CT CHEST W  CONTRAST    Standing Status:   Future    Standing Expiration Date:   05/02/2021    Order Specific Question:   If indicated for the ordered procedure, I authorize the administration of contrast media per Radiology protocol    Answer:   Yes    Order Specific Question:   Preferred imaging location?    Answer:   [facility]    Order Specific Question:   Radiology Contrast Protocol - do NOT remove file path    Answer:   [email or web address]    Earlie Server, MD, PhD

## 2020-10-22 ENCOUNTER — Encounter: Payer: Self-pay | Admitting: Oncology

## 2020-10-31 ENCOUNTER — Ambulatory Visit
Admission: RE | Admit: 2020-10-31 | Discharge: 2020-10-31 | Disposition: A | Discharge status: Home / Self Care | Payer: Medicare HMO | Source: Ambulatory Visit | Attending: Oncology | Admitting: Oncology

## 2020-10-31 ENCOUNTER — Other Ambulatory Visit: Payer: Self-pay

## 2020-10-31 ENCOUNTER — Inpatient Hospital Stay: Payer: Medicare HMO | Attending: Oncology

## 2020-10-31 DIAGNOSIS — Z79899 Other long term (current) drug therapy: Secondary | ICD-10-CM | POA: Insufficient documentation

## 2020-10-31 DIAGNOSIS — Z87891 Personal history of nicotine dependence: Secondary | ICD-10-CM | POA: Insufficient documentation

## 2020-10-31 DIAGNOSIS — Z923 Personal history of irradiation: Secondary | ICD-10-CM | POA: Insufficient documentation

## 2020-10-31 DIAGNOSIS — Z85118 Personal history of other malignant neoplasm of bronchus and lung: Secondary | ICD-10-CM | POA: Insufficient documentation

## 2020-10-31 DIAGNOSIS — Z7901 Long term (current) use of anticoagulants: Secondary | ICD-10-CM | POA: Diagnosis not present

## 2020-10-31 DIAGNOSIS — C3491 Malignant neoplasm of unspecified part of right bronchus or lung: Secondary | ICD-10-CM | POA: Insufficient documentation

## 2020-10-31 DIAGNOSIS — Z9221 Personal history of antineoplastic chemotherapy: Secondary | ICD-10-CM | POA: Diagnosis not present

## 2020-10-31 DIAGNOSIS — I10 Essential (primary) hypertension: Secondary | ICD-10-CM | POA: Diagnosis not present

## 2020-10-31 LAB — CBC WITH DIFFERENTIAL/PLATELET
Abs Immature Granulocytes: 0.02 10*3/uL (ref 0.00–0.07)
Basophils Absolute: 0 10*3/uL (ref 0.0–0.1)
Basophils Relative: 1 %
Eosinophils Absolute: 0.4 10*3/uL (ref 0.0–0.5)
Eosinophils Relative: 8 %
HCT: 40.7 % (ref 36.0–46.0)
Hemoglobin: 14 g/dL (ref 12.0–15.0)
Immature Granulocytes: 0 %
Lymphocytes Relative: 27 %
Lymphs Abs: 1.4 10*3/uL (ref 0.7–4.0)
MCH: 36.2 pg — ABNORMAL HIGH (ref 26.0–34.0)
MCHC: 34.4 g/dL (ref 30.0–36.0)
MCV: 105.2 fL — ABNORMAL HIGH (ref 80.0–100.0)
Monocytes Absolute: 0.4 10*3/uL (ref 0.1–1.0)
Monocytes Relative: 7 %
Neutro Abs: 3.1 10*3/uL (ref 1.7–7.7)
Neutrophils Relative %: 57 %
Platelets: 288 10*3/uL (ref 150–400)
RBC: 3.87 MIL/uL (ref 3.87–5.11)
RDW: 13.3 % (ref 11.5–15.5)
WBC: 5.4 10*3/uL (ref 4.0–10.5)
nRBC: 0 % (ref 0.0–0.2)

## 2020-10-31 LAB — COMPREHENSIVE METABOLIC PANEL WITH GFR
ALT: 21 U/L (ref 0–44)
AST: 23 U/L (ref 15–41)
Albumin: 4 g/dL (ref 3.5–5.0)
Alkaline Phosphatase: 97 U/L (ref 38–126)
Anion gap: 10 (ref 5–15)
BUN: 6 mg/dL — ABNORMAL LOW (ref 8–23)
CO2: 26 mmol/L (ref 22–32)
Calcium: 8.9 mg/dL (ref 8.9–10.3)
Chloride: 104 mmol/L (ref 98–111)
Creatinine, Ser: 0.56 mg/dL (ref 0.44–1.00)
GFR, Estimated: >60 mL/min
Glucose, Bld: 149 mg/dL — ABNORMAL HIGH (ref 70–99)
Potassium: 3.5 mmol/L (ref 3.5–5.1)
Sodium: 140 mmol/L (ref 135–145)
Total Bilirubin: 0.5 mg/dL (ref 0.3–1.2)
Total Protein: 7.2 g/dL (ref 6.5–8.1)

## 2020-10-31 MED ORDER — IOHEXOL 300 MG/ML  SOLN
75.0000 mL | Freq: Once | INTRAMUSCULAR | Status: AC | PRN
  Administered 2020-10-31: 75 mL via INTRAVENOUS

## 2020-11-01 ENCOUNTER — Inpatient Hospital Stay: Payer: Medicare HMO | Admitting: Oncology

## 2020-11-01 ENCOUNTER — Other Ambulatory Visit: Payer: Self-pay

## 2020-11-01 ENCOUNTER — Encounter: Payer: Self-pay | Admitting: Oncology

## 2020-11-01 ENCOUNTER — Inpatient Hospital Stay (HOSPITAL_BASED_OUTPATIENT_CLINIC_OR_DEPARTMENT_OTHER): Payer: Medicare HMO | Admitting: Oncology

## 2020-11-01 VITALS — BP 135/68 | HR 92 | Temp 97.1°F | Resp 17 | Wt 184.5 lb

## 2020-11-01 DIAGNOSIS — C3491 Malignant neoplasm of unspecified part of right bronchus or lung: Secondary | ICD-10-CM | POA: Diagnosis not present

## 2020-11-01 DIAGNOSIS — Z85118 Personal history of other malignant neoplasm of bronchus and lung: Secondary | ICD-10-CM | POA: Diagnosis not present

## 2020-11-01 NOTE — Progress Notes (Signed)
Hematology/Oncology follow up note [facility] Telephone:([phone]) [phone] Fax:(336) [phone]   Patient Care Team: Sharyne Peach, MD as PCP - General (Family Medicine) Earlie Server, MD as Medical Oncologist (Medical Oncology) Noreene Filbert, MD as Referring Physician (Radiation Oncology) Algernon Huxley, MD as Referring Physician (Vascular Surgery) Telford Nab, RN as Registered Nurse  REASON FOR VISIT Follow-up for evaluation prior to immunotherapy for stage IIIA non-small cell lung cancer-adenocarcinoma  HISTORY OF PRESENTING ILLNESS:  Connie Powers is a  70 y.o.  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  CT lung cancer screen 05/27/2017   1. A few bilateral pulmonary nodules measuring up to 5 mm are indeterminate. Considering the suspicious 2.0 cm mediastinal adenopathy, follow up with PET/CT is recommended. 2. Mild bronchial wall thickening could represent airway disease including bronchitis.ACR Lung-RADS Category and Recommendation*: ACR Lung-RADS Category 2S (S modifier for mediastinal adenopathy)  PET scan 3/112019 1. No hypermetabolic pulmonary nodules.2. Hypermetabolic enlarged right paratracheal lymph node, of uncertain etiology in isolation. Lymphoproliferative disorder cannot be excluded. 3.  Aortic atherosclerosis (ICD10-170.0).  EBUS biopsy of paratracheal node showed non small cell lung cancer favoring adenocarcinoma. Case was discussed on tumor board on 09/16/2017 and consensus recommendation is to treat as Stage IIIA disease given the invasion of mediastinum.   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 MRI brain is contraindicated. CT brain negative for metastatic disease.  During the interval patient also was referred to vascular surgery for evaluation of malposition of Medi port.  Patient's right jugular port was removed and right internal jugular  vein Mediport was placed by Dr.Dew on 8/5 2019.  # CT chest scan done 06/02/2018 which was independent reviewed by me. CT chest showed radiation changes in the medial right upper lobe/paramediastinal region.  No evidence of recurrent or metastatic disease. Nonocclusive pericatheter thrombus/fibrin sheath along patient's right chest port. 06/06/2018 US venous upper right extremity ultrasound showed nonocclusive thrombus in the right jugular vein.  The port catheter can be seen associated with this thrombus.  The jugular vein is partially occlusive.  There is also partially occlusive thrombus in the right subclavian vein.  The right axillary, brachial, radial, and ulnar veins are compressible.  Doppler analysis demonstrated phasicity of the Doppler waveform. Patient was started on Eliquis for anticoagulation.   Current Treatment S/p concurrent Chemotherapy [name] /taxol  Weekly x 6] and RT.  Started on maintenance durvalumab every 2 weeks on 12/27/2017. 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 She has stopped smoking. Finish 1 year of Durvalumab [ 26 cycles] on 7/13/ 2020/   INTERVAL HISTORY Connie Powers is a 70 y.o. female who has above history reviewed by me today presents for follow-up of stage III non-small cell lung cancer.  Patient has had interval CT scan.  She has been doing well.  Denies any new concerns.  Review of Systems  Constitutional: Negative for appetite change, fatigue, fever and unexpected weight change.  HENT:   Negative for nosebleeds, sore throat and trouble swallowing.   Eyes: Negative.   Respiratory: Negative.  Negative for cough, shortness of breath and wheezing.   Cardiovascular: Negative.  Negative for chest pain and leg swelling.  Gastrointestinal: Negative for abdominal pain, blood in stool, constipation, diarrhea, nausea and vomiting.  Endocrine: Negative.   Genitourinary: Negative.  Negative for bladder incontinence, hematuria and  nocturia.   Musculoskeletal: Negative.  Negative for back pain and flank pain.  Skin: Negative.   Neurological: Negative.  Negative for dizziness, headaches, light-headedness and numbness.  Hematological: Negative.   Psychiatric/Behavioral: Negative.  Negative for confusion. The patient is not nervous/anxious.     MEDICAL HISTORY:  Past Medical History:  Diagnosis Date  . Brain aneurysm   . Difficult intubation   . Hypertension   . Non-small cell cancer of right lung ([place]) 09/22/2017   Chemo + Rad tx's.   . Viral meningitis     SURGICAL HISTORY: Past Surgical History:  Procedure Laterality Date  . ENDOBRONCHIAL ULTRASOUND N/A 09/09/2017   Procedure: ENDOBRONCHIAL ULTRASOUND;  Surgeon: Laverle Hobby, MD;  Location: [facility] ORS;  Service: Pulmonary;  Laterality: N/A;  . OOPHORECTOMY Left   . PORTA CATH INSERTION N/A 09/29/2017   Procedure: PORTA CATH INSERTION;  Surgeon: Algernon Huxley, MD;  Location: [facility];  Service: Cardiovascular;  Laterality: N/A;  . PORTA CATH INSERTION N/A 01/03/2018   Procedure: PORTA CATH INSERTION;  Surgeon: Algernon Huxley, MD;  Location: [facility];  Service: Cardiovascular;  Laterality: N/A;  . PORTA CATH REMOVAL N/A 01/02/2019   Procedure: PORTA CATH REMOVAL;  Surgeon: Algernon Huxley, MD;  Location: [facility];  Service: Cardiovascular;  Laterality: N/A;  . surgical repair for brain tumor  1969   clips in head  . WISDOM TOOTH EXTRACTION      SOCIAL HISTORY: Social History   Socioeconomic History  . Marital status: Married    Spouse name: Not on file  . Number of children: Not on file  . Years of education: Not on file  . Highest education level: Not on file  Occupational History  . Not on file  Tobacco Use  . Smoking status: Former Smoker    Packs/day: 0.25    Quit date: 10/03/2017    Years since quitting: 3.0  . Smokeless tobacco: Never Used  Vaping Use  . Vaping Use: Never used  Substance and Sexual Activity   . Alcohol use: Not Currently    Comment: social  . Drug use: Not Currently    Types: Cocaine  . Sexual activity: Not on file  Other Topics Concern  . Not on file  Social History Narrative  . Not on file   Social Determinants of Health   Financial Resource Strain: Not on file  Food Insecurity: Not on file  Transportation Needs: Not on file  Physical Activity: Not on file  Stress: Not on file  Social Connections: Not on file  Intimate Partner Violence: Not on file    FAMILY HISTORY: Family History  Problem Relation Age of Onset  . Diabetes Mother   . Hypertension Mother   . Hyperlipidemia Mother   . Arthritis Mother   . Hypertension Father   . Hyperlipidemia Father   . Heart attack Father        x2  . Breast cancer Neg Hx     ALLERGIES:  has No Known Allergies.  MEDICATIONS:  Current Outpatient Medications  Medication Sig Dispense Refill  . amLODipine (NORVASC) 10 MG tablet Take 10 mg by mouth daily.  11  . azithromycin (ZITHROMAX) 250 MG tablet 2 tablet (500 mg) once, then 1 tab (250 mg) daily x4 days 6 each 0  . Cyanocobalamin (B-12) 1000 MCG CAPS Take 1,000 mcg by mouth daily. 30 capsule 3  . docusate sodium (COLACE) 100 MG capsule Take 100 mg by mouth daily.    Marland Kitchen  umeclidinium-vilanterol (ANORO ELLIPTA) 62.5-25 MCG/INH AEPB Inhale 1 puff into the lungs daily as needed (shortness of breath).      No current facility-administered medications for this visit.     PHYSICAL EXAMINATION: ECOG PERFORMANCE STATUS: 0 - Asymptomatic Vitals:   11/01/20 1313  BP: 135/68  Pulse: 92  Resp: 17  Temp: (!) 97.1 F (36.2 C)  SpO2: 99%   Filed Weights   11/01/20 1313  Weight: 184 lb 8 oz (83.7 kg)    Physical Exam Constitutional:      General: She is not in acute distress. HENT:     Head: Normocephalic and atraumatic.  Eyes:     General: No scleral icterus.       Left eye: No discharge.     Conjunctiva/sclera: Conjunctivae normal.     Pupils: Pupils are  equal, round, and reactive to light.  Cardiovascular:     Rate and Rhythm: Normal rate and regular rhythm.     Heart sounds: Normal heart sounds. No murmur heard.   Pulmonary:     Effort: Pulmonary effort is normal. No respiratory distress.     Breath sounds: Normal breath sounds. No wheezing or rales.  Chest:     Chest wall: No tenderness.  Abdominal:     General: Bowel sounds are normal. There is no distension.     Palpations: Abdomen is soft. There is no mass.  Musculoskeletal:        General: No deformity. Normal range of motion.     Cervical back: Normal range of motion and neck supple.  Lymphadenopathy:     Cervical: No cervical adenopathy.  Skin:    General: Skin is warm and dry.     Findings: No erythema or rash.  Neurological:     Mental Status: She is alert and oriented to person, place, and time. Mental status is at baseline.     Cranial Nerves: No cranial nerve deficit.     Coordination: Coordination normal.  Psychiatric:        Mood and Affect: Mood normal.      LABORATORY DATA:  I have reviewed the data as listed Lab Results  Component Value Date   WBC 5.4 10/31/2020   HGB 14.0 10/31/2020   HCT 40.7 10/31/2020   MCV 105.2 (H) 10/31/2020   PLT 288 10/31/2020   Recent Labs    01/29/20 1004 04/30/20 1111 10/31/20 1122  NA 139  --  140  K 3.8  --  3.5  CL 105  --  104  CO2 25  --  26  GLUCOSE 121*  --  149*  BUN 7*  --  6*  CREATININE 0.56 0.50 0.56  CALCIUM 8.6*  --  8.9  GFRNONAA >60  --  >60  GFRAA >60  --   --   PROT 7.2  --  7.2  ALBUMIN 3.9  --  4.0  AST 14*  --  23  ALT 14  --  21  ALKPHOS 83  --  97  BILITOT 0.5  --  0.5   RADIOGRAPHIC STUDIES: I have personally reviewed the radiological images as listed and agreed with the findings in the report. 12/20/2017 Repeat CT scan showed interval decreased of disease. CT images were Independently reviewed by me and discussed with patient. New 50mm right lung lesion, indeterminate.  02/25/2018  CT chest with contrast, presumed evolutionary changes of radiation therapy in the upper right hemithorax.  Aortic atherosclerosis, emphysema. CT CHEST W CONTRAST  Result Date: 10/31/2020 CLINICAL DATA:  Restaging non-small cell lung cancer. Original diagnosis April 2019. Chemotherapy radiation therapy complete. EXAM: CT CHEST WITH CONTRAST TECHNIQUE: Multidetector CT imaging of the chest was performed during intravenous contrast administration. CONTRAST:  53mL OMNIPAQUE IOHEXOL 300 MG/ML  SOLN COMPARISON:  CT 04/30/2020 FINDINGS: Cardiovascular: No significant vascular findings. Normal heart size. No pericardial effusion. Mediastinum/Nodes: LEFT lower paratracheal node measures 9 mm compared to 6 mm. Prevascular node measures 5 mm in 5 mm. Subcarinal node measures 10 mm compared to 9 mm. Thickening about the RIGHT suprahilar tissue is similar prior (image 46/2. No clear hilar adenopathy separate from the perihilar thickening. Lungs/Pleura: Perihilar consolidation extending from the RIGHT hila and superiorly centrally within the RIGHT lung. There is peripheral bronchiectasis tissue this band like fibrosis. Findings typical of radiation change is not significant changed from prior. Stable 6 mm RIGHT lower lobe pulmonary nodule (image 90/3). Upper Abdomen: Limited view of the liver, kidneys, pancreas are unremarkable. Normal adrenal glands. Musculoskeletal: No aggressive osseous lesion. IMPRESSION: 1. Post radiation change in the RIGHT upper lobe and RIGHT hilum similar to prior. 2. Mildly prominent mediastinal lymph nodes are nonspecific. Recommend attention on follow-up. 3. Stable LEFT lobe pulmonary nodule. Recommend attention on follow-up. Electronically Signed   By: Suzy Bouchard M.D.   On: 10/31/2020 16:16     ASSESSMENT & PLAN:  Cancer Staging Non-small cell lung cancer ([place]) Staging form: Lung, AJCC 8th Edition - Clinical stage from 09/22/2017: Stage Unknown (cTX, cN1, cM0) - Signed by Earlie Server, MD  on 09/22/2017 Type of lung cancer: Locally advanced or metastatic non-small cell lung cancer - Pathologic: No stage assigned - Unsigned  No diagnosis found.   #Clinical stage IIIA lung NSCLC, favoring adenocarcinoma CT chest with contrast from 10/31/20 shows postradiation changes in the right upper lobe and right hilum similar to prior.  Mild predominant mediastinal lymph nodes are nonspecific.  Recommend attention to follow-up.  It has been 3 years after she finished concurrent chemo and RT.   Spoke with Dr. Tasia Catchings who would like to get a CT chest in 3 months instead of 6 given possible mediastinal lymph node.  Patient is agreeable.  RTC in 3 months with repeat CT scan and see Dr. Tasia Catchings to discuss results.   Greater than 50% was spent in counseling and coordination of care with this patient including but not limited to discussion of the relevant topics above (See A&P) including, but not limited to diagnosis and management of acute and chronic medical conditions.   Faythe Casa, NP 11/01/2020 3:38 PM

## 2020-11-04 ENCOUNTER — Ambulatory Visit: Payer: Medicare HMO | Admitting: Oncology

## 2020-11-20 ENCOUNTER — Ambulatory Visit
Admission: RE | Admit: 2020-11-20 | Discharge: 2020-11-20 | Disposition: A | Discharge status: Home / Self Care | Payer: Medicare HMO | Source: Ambulatory Visit | Attending: Radiation Oncology | Admitting: Radiation Oncology

## 2020-11-20 VITALS — BP 156/77 | HR 83 | Temp 95.7°F | Resp 16 | Wt 182.2 lb

## 2020-11-20 DIAGNOSIS — F17210 Nicotine dependence, cigarettes, uncomplicated: Secondary | ICD-10-CM | POA: Diagnosis not present

## 2020-11-20 DIAGNOSIS — Z923 Personal history of irradiation: Secondary | ICD-10-CM | POA: Insufficient documentation

## 2020-11-20 DIAGNOSIS — Z85118 Personal history of other malignant neoplasm of bronchus and lung: Secondary | ICD-10-CM | POA: Diagnosis present

## 2020-11-20 DIAGNOSIS — C771 Secondary and unspecified malignant neoplasm of intrathoracic lymph nodes: Secondary | ICD-10-CM | POA: Insufficient documentation

## 2020-11-20 DIAGNOSIS — C3491 Malignant neoplasm of unspecified part of right bronchus or lung: Secondary | ICD-10-CM

## 2020-11-20 NOTE — Progress Notes (Signed)
Radiation Oncology Follow up Note  Name: Connie Powers   Date:   11/20/2020 MRN:  [id number] DOB: Oct 14, 1950    This 70 y.o. female presents to the clinic today for 3-year follow-up status post concurrent chemoradiation therapy for stage IIIa (T4 N0 M0) adenocarcinoma the right lung.  REFERRING PROVIDER: Sharyne Peach, MD  HPI: Patient is a 70 year old female now out 3 years having completed concurrent chemoradiation for stage IIIa (T4 N0 M0) adenocarcinoma the right lung.  Seen today in routine follow-up she is doing well.  She specifically denies cough hemoptysis or chest tightness..  Her most recent CT scan this month showed postradiation changes in the right upper lobe and right hilum which is stable.  There is mild prominent mediastinal lymph nodes which are nonspecific.  She is currently currently under observation by medical oncology.  COMPLICATIONS OF TREATMENT: none  FOLLOW UP COMPLIANCE: keeps appointments   PHYSICAL EXAM:  BP (!) 156/77 (BP Location: Left Arm, Patient Position: Sitting)   Pulse 83   Temp (!) 95.7 F (35.4 C) (Tympanic)   Resp 16   Wt 182 lb 3.2 oz (82.6 kg)   BMI 31.27 kg/m  Well-developed well-nourished patient in NAD. HEENT reveals PERLA, EOMI, discs not visualized.  Oral cavity is clear. No oral mucosal lesions are identified. Neck is clear without evidence of cervical or supraclavicular adenopathy. Lungs are clear to A&P. Cardiac examination is essentially unremarkable with regular rate and rhythm without murmur rub or thrill. Abdomen is benign with no organomegaly or masses noted. Motor sensory and DTR levels are equal and symmetric in the upper and lower extremities. Cranial nerves II through XII are grossly intact. Proprioception is intact. No peripheral adenopathy or edema is identified. No motor or sensory levels are noted. Crude visual fields are within normal range.  RADIOLOGY RESULTS: CT scans reviewed compatible with above-stated  findings  PLAN: Present time patient is doing well with no evidence of disease now out 3 years from concurrent chemoradiation therapy for stage IIIa adenocarcinoma the right lung.  And pleased with her overall progress.  I will see her back in 1 year for follow-up and then discontinue follow-up care.  She continues close follow-up care with Dr. Tasia Catchings.  Patient knows to call with any concerns.  I would like to take this opportunity to thank you for allowing me to participate in the care of your patient.Noreene Filbert, MD

## 2021-02-10 ENCOUNTER — Other Ambulatory Visit: Payer: Self-pay

## 2021-02-10 ENCOUNTER — Ambulatory Visit
Admission: RE | Admit: 2021-02-10 | Discharge: 2021-02-10 | Disposition: A | Discharge status: Home / Self Care | Payer: Medicare HMO | Source: Ambulatory Visit | Attending: Oncology | Admitting: Oncology

## 2021-02-10 DIAGNOSIS — C3491 Malignant neoplasm of unspecified part of right bronchus or lung: Secondary | ICD-10-CM | POA: Insufficient documentation

## 2021-02-12 ENCOUNTER — Encounter: Payer: Self-pay | Admitting: Oncology

## 2021-02-12 ENCOUNTER — Inpatient Hospital Stay: Payer: Medicare HMO | Attending: Oncology | Admitting: Oncology

## 2021-02-12 ENCOUNTER — Other Ambulatory Visit: Payer: Self-pay

## 2021-02-12 VITALS — BP 137/79 | HR 79 | Temp 96.8°F | Resp 18 | Wt 180.0 lb

## 2021-02-12 DIAGNOSIS — Z79899 Other long term (current) drug therapy: Secondary | ICD-10-CM | POA: Insufficient documentation

## 2021-02-12 DIAGNOSIS — C3491 Malignant neoplasm of unspecified part of right bronchus or lung: Secondary | ICD-10-CM

## 2021-02-12 DIAGNOSIS — I10 Essential (primary) hypertension: Secondary | ICD-10-CM | POA: Insufficient documentation

## 2021-02-12 DIAGNOSIS — F17210 Nicotine dependence, cigarettes, uncomplicated: Secondary | ICD-10-CM | POA: Diagnosis not present

## 2021-02-12 DIAGNOSIS — C3432 Malignant neoplasm of lower lobe, left bronchus or lung: Secondary | ICD-10-CM | POA: Insufficient documentation

## 2021-02-12 DIAGNOSIS — Z720 Tobacco use: Secondary | ICD-10-CM

## 2021-02-12 DIAGNOSIS — Z7901 Long term (current) use of anticoagulants: Secondary | ICD-10-CM | POA: Insufficient documentation

## 2021-02-12 DIAGNOSIS — Z923 Personal history of irradiation: Secondary | ICD-10-CM | POA: Diagnosis not present

## 2021-02-12 DIAGNOSIS — Z9221 Personal history of antineoplastic chemotherapy: Secondary | ICD-10-CM | POA: Insufficient documentation

## 2021-02-12 NOTE — Progress Notes (Signed)
Pt in for follow up, denies any concerns today. 

## 2021-02-12 NOTE — Progress Notes (Signed)
Hematology/Oncology follow up note [facility] [phone] Fax:([phone]   Patient Care Team: Sharyne Peach, MD as PCP - General (Family Medicine) Earlie Server, MD as Medical Oncologist (Medical Oncology) Noreene Filbert, MD as Referring Physician (Radiation Oncology) Algernon Huxley, MD as Referring Physician (Vascular Surgery) Telford Nab, RN as Registered Nurse  REASON FOR VISIT Follow-up for evaluation prior to immunotherapy for stage IIIA non-small cell lung cancer-adenocarcinoma   HISTORY OF PRESENTING ILLNESS:  Connie Powers is a  70 y.o.  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  CT lung cancer screen 05/27/2017   1. A few bilateral pulmonary nodules measuring up to 5 mm are indeterminate. Considering the suspicious 2.0 cm mediastinal adenopathy, follow up with PET/CT is recommended. 2. Mild bronchial wall thickening could represent airway disease including bronchitis.ACR Lung-RADS Category and Recommendation*: ACR Lung-RADS Category 2S (S modifier for mediastinal adenopathy)  PET scan 3/112019 1. No hypermetabolic pulmonary nodules.2. Hypermetabolic enlarged right paratracheal lymph node, of uncertain etiology in isolation. Lymphoproliferative disorder cannot be excluded. 3.  Aortic atherosclerosis (ICD10-170.0).  EBUS biopsy of paratracheal node showed non small cell lung cancer favoring adenocarcinoma. Case was discussed on tumor board on 09/16/2017 and consensus recommendation is to treat as Stage IIIA disease given the invasion of mediastinum.   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 MRI brain is contraindicated. CT brain negative for metastatic disease.  During the interval patient also was referred to vascular surgery for evaluation of malposition of Medi port.  Patient's right jugular port was removed and right internal jugular  vein Mediport was placed by Dr.Dew on 8/5 2019.  # CT chest scan done 06/02/2018 which was independent reviewed by me. CT chest showed radiation changes in the medial right upper lobe/paramediastinal region.  No evidence of recurrent or metastatic disease. Nonocclusive pericatheter thrombus/fibrin sheath along patient's right chest port. 06/06/2018 US venous upper right extremity ultrasound showed nonocclusive thrombus in the right jugular vein.  The port catheter can be seen associated with this thrombus.  The jugular vein is partially occlusive.  There is also partially occlusive thrombus in the right subclavian vein.  The right axillary, brachial, radial, and ulnar veins are compressible.  Doppler analysis demonstrated phasicity of the Doppler waveform. Patient was started on Eliquis for anticoagulation.   Current Treatment S/p concurrent Chemotherapy [name] /taxol  Weekly x 6] and RT.  Started on maintenance durvalumab every 2 weeks on 12/27/2017. 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 Finish 1 year of Durvalumab [ 26 cycles] on 7/13/ 2020/   INTERVAL HISTORY Connie Powers is a 70 y.o. female who has above history reviewed by me today presents for follow-up of stage III non-small cell lung cancer Patient has had interval CT scan .continues to smoke daily, 2 cigarettes.  She has lost 4 pounds since last visit 3 months ago.  Appetite is fair.  Denies any shortness of breath more than her basic level.  Review of Systems  Constitutional:  Negative for appetite change, chills, fatigue and fever.  HENT:   Negative for hearing loss and voice change.   Eyes:  Negative for eye problems.  Respiratory:  Negative for chest tightness and cough.        Chronic cough  Cardiovascular:  Negative for chest pain.  Gastrointestinal:  Negative for abdominal distention, abdominal pain and blood in stool.  Endocrine:  Negative for hot flashes.  Genitourinary:  Negative for difficulty  urinating and frequency.   Musculoskeletal:  Negative for arthralgias.  Skin:  Negative for itching and rash.  Neurological:  Negative for extremity weakness.  Hematological:  Negative for adenopathy.  Psychiatric/Behavioral:  Negative for confusion.    MEDICAL HISTORY:  Past Medical History:  Diagnosis Date   Brain aneurysm    Difficult intubation    Hypertension    Non-small cell cancer of right lung ([place]) 09/22/2017   Chemo + Rad tx's.    Viral meningitis     SURGICAL HISTORY: Past Surgical History:  Procedure Laterality Date   ENDOBRONCHIAL ULTRASOUND N/A 09/09/2017   Procedure: ENDOBRONCHIAL ULTRASOUND;  Surgeon: Laverle Hobby, MD;  Location: [facility];  Service: Pulmonary;  Laterality: N/A;   OOPHORECTOMY Left    PORTA CATH INSERTION N/A 09/29/2017   Procedure: PORTA CATH INSERTION;  Surgeon: Algernon Huxley, MD;  Location: [place] [facility];  Service: Cardiovascular;  Laterality: N/A;   PORTA CATH INSERTION N/A 01/03/2018   Procedure: PORTA CATH INSERTION;  Surgeon: Algernon Huxley, MD;  Location: [place] [facility];  Service: Cardiovascular;  Laterality: N/A;   PORTA CATH REMOVAL N/A 01/02/2019   Procedure: PORTA CATH REMOVAL;  Surgeon: Algernon Huxley, MD;  Location: [place] [facility];  Service: Cardiovascular;  Laterality: N/A;   surgical repair for brain tumor  1969   clips in head   WISDOM TOOTH EXTRACTION      SOCIAL HISTORY: Social History   Socioeconomic History   Marital status: Married    Spouse name: Not on file   Number of children: Not on file   Years of education: Not on file   Highest education level: Not on file  Occupational History   Not on file  Tobacco Use   Smoking status: Former    Packs/day: 0.25    Types: Cigarettes    Quit date: 10/03/2017    Years since quitting: 3.3   Smokeless tobacco: Never  Vaping Use   Vaping Use: Never used  Substance and Sexual Activity   Alcohol use: Not Currently    Comment: social   Drug use: Not  Currently    Types: Cocaine   Sexual activity: Not on file  Other Topics Concern   Not on file  Social History Narrative   Not on file   Social Determinants of Health   Financial Resource Strain: Not on file  Food Insecurity: Not on file  Transportation Needs: Not on file  Physical Activity: Not on file  Stress: Not on file  Social Connections: Not on file  Intimate Partner Violence: Not on file    FAMILY HISTORY: Family History  Problem Relation Age of Onset   Diabetes Mother    Hypertension Mother    Hyperlipidemia Mother    Arthritis Mother    Hypertension Father    Hyperlipidemia Father    Heart attack Father        x2   Breast cancer Neg Hx     ALLERGIES:  has No Known Allergies.  MEDICATIONS:  Current Outpatient Medications  Medication Sig Dispense Refill   amLODipine (NORVASC) 10 MG tablet Take 10 mg by mouth daily.  11   Cyanocobalamin (B-12) 1000 MCG CAPS Take 1,000 mcg by mouth daily. 30 capsule 3   docusate sodium (COLACE) 100 MG capsule Take 100 mg by mouth daily.     No current facility-administered medications for this visit.  PHYSICAL EXAMINATION: ECOG PERFORMANCE STATUS: 0 - Asymptomatic Vitals:   02/12/21 1400  BP: 137/79  Pulse: 79  Resp: 18  Temp: (!) 96.8 F (36 C)  SpO2: 94%   Filed Weights   02/12/21 1400  Weight: 180 lb (81.6 kg)    Physical Exam Constitutional:      General: She is not in acute distress. HENT:     Head: Normocephalic and atraumatic.  Eyes:     General: No scleral icterus.       Left eye: No discharge.     Conjunctiva/sclera: Conjunctivae normal.     Pupils: Pupils are equal, round, and reactive to light.  Cardiovascular:     Rate and Rhythm: Normal rate and regular rhythm.     Heart sounds: Normal heart sounds. No murmur heard. Pulmonary:     Effort: Pulmonary effort is normal. No respiratory distress.     Breath sounds: Normal breath sounds. No wheezing or rales.  Chest:     Chest wall: No  tenderness.  Abdominal:     General: Bowel sounds are normal. There is no distension.     Palpations: Abdomen is soft. There is no mass.  Musculoskeletal:        General: No deformity. Normal range of motion.     Cervical back: Normal range of motion and neck supple.  Lymphadenopathy:     Cervical: No cervical adenopathy.  Skin:    General: Skin is warm and dry.     Findings: No erythema or rash.  Neurological:     Mental Status: She is alert and oriented to person, place, and time. Mental status is at baseline.     Cranial Nerves: No cranial nerve deficit.     Coordination: Coordination normal.  Psychiatric:        Mood and Affect: Mood normal.     LABORATORY DATA:  I have reviewed the data as listed Lab Results  Component Value Date   WBC 5.4 10/31/2020   HGB 14.0 10/31/2020   HCT 40.7 10/31/2020   MCV 105.2 (H) 10/31/2020   PLT 288 10/31/2020   Recent Labs    04/30/20 1111 10/31/20 1122  NA  --  140  K  --  3.5  CL  --  104  CO2  --  26  GLUCOSE  --  149*  BUN  --  6*  CREATININE 0.50 0.56  CALCIUM  --  8.9  GFRNONAA  --  >60  PROT  --  7.2  ALBUMIN  --  4.0  AST  --  23  ALT  --  21  ALKPHOS  --  97  BILITOT  --  0.5    RADIOGRAPHIC STUDIES: I have personally reviewed the radiological images as listed and agreed with the findings in the report. 12/20/2017 Repeat CT scan showed interval decreased of disease. CT images were Independently reviewed by me and discussed with patient. New 4mm right lung lesion, indeterminate.  02/25/2018 CT chest with contrast, presumed evolutionary changes of radiation therapy in the upper right hemithorax.  Aortic atherosclerosis, emphysema. CT Chest Wo Contrast  Result Date: 02/10/2021 CLINICAL DATA:  Lung cancer treated with radiation and chemotherapy 3 years ago. EXAM: CT CHEST WITHOUT CONTRAST TECHNIQUE: Multidetector CT imaging of the chest was performed following the standard protocol without IV contrast. COMPARISON:   Prior chest CT exams dating back to 03/24/2019. FINDINGS: Cardiovascular: Atherosclerotic calcification of the aorta and aortic valve. Heart is at the upper limits of  normal in size to mildly enlarged. No pericardial effusion. Mediastinum/Nodes: No pathologically enlarged mediastinal lymph nodes. Low left paratracheal lymph node measures 10 mm, unchanged. Hilar regions are difficult to definitively evaluate without IV contrast. No axillary adenopathy. Esophagus is grossly unremarkable. Lungs/Pleura: Postradiation parenchymal retraction in the medial right upper lobe. Centrilobular emphysema. 6 mm posterior left lower lobe nodule (4/87), unchanged from 03/24/2019. 6 x 10 mm clustered nodularity more inferiorly within the posterior left lower lobe (4/101), stable but with somewhat of a waxing and waning appearance over prior exams. No pleural fluid. Airway is unremarkable. Upper Abdomen: Visualized portions of the liver, adrenal glands, kidneys, spleen, pancreas, stomach and bowel are unremarkable with the exception of a small hiatal hernia. Upper abdominal lymph nodes are not enlarged by CT size criteria. Musculoskeletal: Degenerative changes in the spine. Old right rib fracture. No worrisome lytic or sclerotic lesions. IMPRESSION: 1. 6 x 10 mm left lower lobe clustered nodularity with somewhat of a waxing and waning appearance, suggesting the possibility of mucoid impaction or an infectious/inflammatory lesion. Difficult to exclude malignancy. Continued attention on follow-up is recommended. 2. 6 mm left lower lobe nodule is unchanged from 03/24/2019 and likely benign. 3. Postradiation pulmonary retraction in the right upper lobe, stable. 4.  Emphysema (ICD10-J43.9). 5.  Aortic atherosclerosis (ICD10-I70.0). Electronically Signed   By: Lorin Picket M.D.   On: 02/10/2021 16:18      ASSESSMENT & PLAN:  Cancer Staging Non-small cell lung cancer [facility]) Staging form: Lung, AJCC 8th Edition - Clinical stage from  09/22/2017: Stage Unknown (cTX, cN1, cM0) - Signed by Earlie Server, MD on 09/22/2017 Type of lung cancer: Locally advanced or metastatic non-small cell lung cancer - Pathologic: No stage assigned - Unsigned  1. Non-small cell cancer of right lung ([place])   2. Tobacco abuse    #Clinical stage IIIA lung NSCLC, favoring adenocarcinoma 02/10/2021, CT chest without contrast showed 6 mm left lower lobe nodule is unchanged.  Likely benign. 6 x 10 mm left lower lobe clustered nodularity, waxing and waning.  Possible mucoid impaction additional infectious/inflammatory lesion.  Difficult to exclude malignancy. I will repeat another CT scan in 3 months for follow-up.  #Tobacco abuse, she continues to smoke daily.   RTC 3 months after CT surveillance scan. Orders Placed This Encounter  Procedures   CT CHEST W CONTRAST    Standing Status:   Future    Standing Expiration Date:   02/12/2022    Order Specific Question:   If indicated for the ordered procedure, I authorize the administration of contrast media per Radiology protocol    Answer:   Yes    Order Specific Question:   Preferred imaging location?    Answer:   [facility]    Order Specific Question:   Radiology Contrast Protocol - do NOT remove file path    Answer:   [email or web address]    Earlie Server, MD, PhD

## 2021-05-02 ENCOUNTER — Ambulatory Visit: Payer: Medicare HMO

## 2021-05-07 ENCOUNTER — Other Ambulatory Visit: Payer: Self-pay | Admitting: *Deleted

## 2021-05-07 DIAGNOSIS — C3491 Malignant neoplasm of unspecified part of right bronchus or lung: Secondary | ICD-10-CM

## 2021-05-08 ENCOUNTER — Ambulatory Visit: Payer: Medicare HMO | Admitting: Oncology

## 2021-05-12 ENCOUNTER — Inpatient Hospital Stay: Payer: Medicare HMO | Attending: Oncology

## 2021-05-12 ENCOUNTER — Other Ambulatory Visit: Payer: Self-pay

## 2021-05-12 ENCOUNTER — Ambulatory Visit
Admission: RE | Admit: 2021-05-12 | Discharge: 2021-05-12 | Disposition: A | Discharge status: Home / Self Care | Payer: Medicare HMO | Source: Ambulatory Visit | Attending: Oncology | Admitting: Oncology

## 2021-05-12 DIAGNOSIS — C3491 Malignant neoplasm of unspecified part of right bronchus or lung: Secondary | ICD-10-CM

## 2021-05-12 DIAGNOSIS — F17210 Nicotine dependence, cigarettes, uncomplicated: Secondary | ICD-10-CM | POA: Diagnosis not present

## 2021-05-12 DIAGNOSIS — C342 Malignant neoplasm of middle lobe, bronchus or lung: Secondary | ICD-10-CM | POA: Insufficient documentation

## 2021-05-12 DIAGNOSIS — I10 Essential (primary) hypertension: Secondary | ICD-10-CM | POA: Diagnosis not present

## 2021-05-12 DIAGNOSIS — Z7901 Long term (current) use of anticoagulants: Secondary | ICD-10-CM | POA: Diagnosis not present

## 2021-05-12 DIAGNOSIS — Z79899 Other long term (current) drug therapy: Secondary | ICD-10-CM | POA: Insufficient documentation

## 2021-05-12 DIAGNOSIS — D7589 Other specified diseases of blood and blood-forming organs: Secondary | ICD-10-CM | POA: Insufficient documentation

## 2021-05-12 LAB — CBC WITH DIFFERENTIAL/PLATELET
Abs Immature Granulocytes: 0.01 10*3/uL (ref 0.00–0.07)
Basophils Absolute: 0 10*3/uL (ref 0.0–0.1)
Basophils Relative: 1 %
Eosinophils Absolute: 0.2 10*3/uL (ref 0.0–0.5)
Eosinophils Relative: 3 %
HCT: 44 % (ref 36.0–46.0)
Hemoglobin: 14.8 g/dL (ref 12.0–15.0)
Immature Granulocytes: 0 %
Lymphocytes Relative: 23 %
Lymphs Abs: 1.1 10*3/uL (ref 0.7–4.0)
MCH: 36 pg — ABNORMAL HIGH (ref 26.0–34.0)
MCHC: 33.6 g/dL (ref 30.0–36.0)
MCV: 107.1 fL — ABNORMAL HIGH (ref 80.0–100.0)
Monocytes Absolute: 0.3 10*3/uL (ref 0.1–1.0)
Monocytes Relative: 6 %
Neutro Abs: 3.2 10*3/uL (ref 1.7–7.7)
Neutrophils Relative %: 67 %
Platelets: 266 10*3/uL (ref 150–400)
RBC: 4.11 MIL/uL (ref 3.87–5.11)
RDW: 13.3 % (ref 11.5–15.5)
WBC: 4.7 10*3/uL (ref 4.0–10.5)
nRBC: 0 % (ref 0.0–0.2)

## 2021-05-12 LAB — COMPREHENSIVE METABOLIC PANEL
ALT: 22 U/L (ref 0–44)
AST: 22 U/L (ref 15–41)
Albumin: 4 g/dL (ref 3.5–5.0)
Alkaline Phosphatase: 99 U/L (ref 38–126)
Anion gap: 8 (ref 5–15)
BUN: 8 mg/dL (ref 8–23)
CO2: 27 mmol/L (ref 22–32)
Calcium: 8.6 mg/dL — ABNORMAL LOW (ref 8.9–10.3)
Chloride: 99 mmol/L (ref 98–111)
Creatinine, Ser: 0.58 mg/dL (ref 0.44–1.00)
GFR, Estimated: >60 mL/min (ref >60)
Glucose, Bld: 120 mg/dL — ABNORMAL HIGH (ref 70–99)
Potassium: 3.5 mmol/L (ref 3.5–5.1)
Sodium: 134 mmol/L — ABNORMAL LOW (ref 135–145)
Total Bilirubin: 0.6 mg/dL (ref 0.3–1.2)
Total Protein: 7.2 g/dL (ref 6.5–8.1)

## 2021-05-12 LAB — POCT I-STAT CREATININE: Creatinine, Ser: 0.5 mg/dL (ref 0.44–1.00)

## 2021-05-12 MED ORDER — IOHEXOL 350 MG/ML SOLN
75.0000 mL | Freq: Once | INTRAVENOUS | Status: AC | PRN
  Administered 2021-05-12: 65 mL via INTRAVENOUS

## 2021-05-15 ENCOUNTER — Other Ambulatory Visit: Payer: Self-pay

## 2021-05-15 ENCOUNTER — Encounter: Payer: Self-pay | Admitting: Oncology

## 2021-05-15 ENCOUNTER — Inpatient Hospital Stay (HOSPITAL_BASED_OUTPATIENT_CLINIC_OR_DEPARTMENT_OTHER): Payer: Medicare HMO | Admitting: Oncology

## 2021-05-15 VITALS — BP 150/82 | HR 96 | Temp 97.7°F | Wt 181.3 lb

## 2021-05-15 DIAGNOSIS — C3491 Malignant neoplasm of unspecified part of right bronchus or lung: Secondary | ICD-10-CM

## 2021-05-15 DIAGNOSIS — Z720 Tobacco use: Secondary | ICD-10-CM | POA: Diagnosis not present

## 2021-05-15 DIAGNOSIS — C342 Malignant neoplasm of middle lobe, bronchus or lung: Secondary | ICD-10-CM | POA: Diagnosis not present

## 2021-05-15 NOTE — Progress Notes (Signed)
Hematology/Oncology follow up note [phone] [phone]   Patient Care Team: Sharyne Peach, MD as PCP - General (Family Medicine) Earlie Server, MD as Medical Oncologist (Medical Oncology) Noreene Filbert, MD as Referring Physician (Radiation Oncology) Lucky Cowboy Erskine Squibb, MD as Referring Physician (Vascular Surgery) Telford Nab, RN as Registered Nurse  REASON FOR VISIT Follow-up for evaluation prior to immunotherapy for stage IIIA non-small cell lung cancer-adenocarcinoma   HISTORY OF PRESENTING ILLNESS:  Connie Powers is a  70 y.o.  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  CT lung cancer screen 05/27/2017   1. A few bilateral pulmonary nodules measuring up to 5 mm are indeterminate. Considering the suspicious 2.0 cm mediastinal adenopathy, follow up with PET/CT is recommended. 2. Mild bronchial wall thickening could represent airway disease including bronchitis.ACR Lung-RADS Category and Recommendation*: ACR Lung-RADS Category 2S (S modifier for mediastinal adenopathy)  PET scan 3/112019 1. No hypermetabolic pulmonary nodules.2. Hypermetabolic enlarged right paratracheal lymph node, of uncertain etiology in isolation. Lymphoproliferative disorder cannot be excluded. 3.  Aortic atherosclerosis (ICD10-170.0).  EBUS biopsy of paratracheal node showed non small cell lung cancer favoring adenocarcinoma. Case was discussed on tumor board on 09/16/2017 and consensus recommendation is to treat as Stage IIIA disease given the invasion of mediastinum.   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 MRI brain is contraindicated. CT brain negative for metastatic disease.  During the interval patient also was referred to vascular surgery for evaluation of malposition of Medi port.  Patient's right jugular port was removed and right internal jugular vein Mediport was placed by Dr.Dew  on 8/5 2019.  # CT chest scan done 06/02/2018 which was independent reviewed by me. CT chest showed radiation changes in the medial right upper lobe/paramediastinal region.  No evidence of recurrent or metastatic disease. Nonocclusive pericatheter thrombus/fibrin sheath along patient's right chest port. 06/06/2018 US venous upper right extremity ultrasound showed nonocclusive thrombus in the right jugular vein.  The port catheter can be seen associated with this thrombus.  The jugular vein is partially occlusive.  There is also partially occlusive thrombus in the right subclavian vein.  The right axillary, brachial, radial, and ulnar veins are compressible.  Doppler analysis demonstrated phasicity of the Doppler waveform. Patient was started on Eliquis for anticoagulation.   Current Treatment S/p concurrent Chemotherapy [name] /taxol  Weekly x 6] and RT.  Started on maintenance durvalumab every 2 weeks on 12/27/2017. 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 Finish 1 year of Durvalumab [ 26 cycles] on 7/13/ 2020/   INTERVAL HISTORY Connie Powers is a 70 y.o. female who has above history reviewed by me today presents for follow-up of stage III non-small cell lung cancer Patient continues to smoke a few cigarettes daily. She has had surveillance CT scan done during interval and presents to discuss results. Chronic cough unchanged.  Weight has been stable  Review of Systems  Constitutional:  Negative for appetite change, chills, fatigue and fever.  HENT:   Negative for hearing loss and voice change.   Eyes:  Negative for eye problems.  Respiratory:  Negative for chest tightness and cough.        Chronic cough  Cardiovascular:  Negative for chest pain.  Gastrointestinal:  Negative for abdominal distention, abdominal pain and blood in stool.  Endocrine: Negative for hot flashes.  Genitourinary:  Negative for difficulty urinating and frequency.  Musculoskeletal:  Negative  for arthralgias.  Skin:  Negative for itching and rash.  Neurological:  Negative for extremity weakness.  Hematological:  Negative for adenopathy.  Psychiatric/Behavioral:  Negative for confusion.    MEDICAL HISTORY:  Past Medical History:  Diagnosis Date   Brain aneurysm    Difficult intubation    Hypertension    Non-small cell cancer of right lung ([place]) 09/22/2017   Chemo + Rad tx's.    Viral meningitis     SURGICAL HISTORY: Past Surgical History:  Procedure Laterality Date   ENDOBRONCHIAL ULTRASOUND N/A 09/09/2017   Procedure: ENDOBRONCHIAL ULTRASOUND;  Surgeon: Laverle Hobby, MD;  Location: [facility];  Service: Pulmonary;  Laterality: N/A;   OOPHORECTOMY Left    PORTA CATH INSERTION N/A 09/29/2017   Procedure: PORTA CATH INSERTION;  Surgeon: Algernon Huxley, MD;  Location: [place] [facility];  Service: Cardiovascular;  Laterality: N/A;   PORTA CATH INSERTION N/A 01/03/2018   Procedure: PORTA CATH INSERTION;  Surgeon: Algernon Huxley, MD;  Location: [place] [facility];  Service: Cardiovascular;  Laterality: N/A;   PORTA CATH REMOVAL N/A 01/02/2019   Procedure: PORTA CATH REMOVAL;  Surgeon: Algernon Huxley, MD;  Location: [place] [facility];  Service: Cardiovascular;  Laterality: N/A;   surgical repair for brain tumor  1969   clips in head   WISDOM TOOTH EXTRACTION      SOCIAL HISTORY: Social History   Socioeconomic History   Marital status: Married    Spouse name: Not on file   Number of children: Not on file   Years of education: Not on file   Highest education level: Not on file  Occupational History   Not on file  Tobacco Use   Smoking status: Former    Packs/day: 0.25    Types: Cigarettes    Quit date: 10/03/2017    Years since quitting: 3.6   Smokeless tobacco: Never  Vaping Use   Vaping Use: Never used  Substance and Sexual Activity   Alcohol use: Not Currently    Comment: social   Drug use: Not Currently    Types: Cocaine   Sexual activity: Not on file   Other Topics Concern   Not on file  Social History Narrative   Not on file   Social Determinants of Health   Financial Resource Strain: Not on file  Food Insecurity: Not on file  Transportation Needs: Not on file  Physical Activity: Not on file  Stress: Not on file  Social Connections: Not on file  Intimate Partner Violence: Not on file    FAMILY HISTORY: Family History  Problem Relation Age of Onset   Diabetes Mother    Hypertension Mother    Hyperlipidemia Mother    Arthritis Mother    Hypertension Father    Hyperlipidemia Father    Heart attack Father        x2   Breast cancer Neg Hx     ALLERGIES:  has No Known Allergies.  MEDICATIONS:  Current Outpatient Medications  Medication Sig Dispense Refill   amLODipine (NORVASC) 10 MG tablet Take 10 mg by mouth daily.  11   Cyanocobalamin (B-12) 1000 MCG CAPS Take 1,000 mcg by mouth daily. 30 capsule 3   docusate sodium (COLACE) 100 MG capsule Take 100 mg by mouth daily.     No current facility-administered medications for this visit.     PHYSICAL EXAMINATION: ECOG PERFORMANCE STATUS: 0 - Asymptomatic Vitals:   05/15/21 1336  BP: Marland Kitchen)  150/82  Pulse: 96  Temp: 97.7 F (36.5 C)   Filed Weights   05/15/21 1336  Weight: 181 lb 4.8 oz (82.2 kg)    Physical Exam Constitutional:      General: She is not in acute distress. HENT:     Head: Normocephalic and atraumatic.  Eyes:     General: No scleral icterus.       Left eye: No discharge.     Conjunctiva/sclera: Conjunctivae normal.     Pupils: Pupils are equal, round, and reactive to light.  Cardiovascular:     Rate and Rhythm: Normal rate and regular rhythm.     Heart sounds: Normal heart sounds. No murmur heard. Pulmonary:     Effort: Pulmonary effort is normal. No respiratory distress.     Breath sounds: No wheezing or rales.     Comments: Decreased breath sound bilaterally Chest:     Chest wall: No tenderness.  Abdominal:     General: Bowel sounds  are normal. There is no distension.     Palpations: Abdomen is soft. There is no mass.  Musculoskeletal:        General: No deformity. Normal range of motion.     Cervical back: Normal range of motion and neck supple.  Lymphadenopathy:     Cervical: No cervical adenopathy.  Skin:    General: Skin is warm and dry.     Findings: No erythema or rash.  Neurological:     Mental Status: She is alert and oriented to person, place, and time. Mental status is at baseline.     Cranial Nerves: No cranial nerve deficit.     Coordination: Coordination normal.  Psychiatric:        Mood and Affect: Mood normal.     LABORATORY DATA:  I have reviewed the data as listed Lab Results  Component Value Date   WBC 4.7 05/12/2021   HGB 14.8 05/12/2021   HCT 44.0 05/12/2021   MCV 107.1 (H) 05/12/2021   PLT 266 05/12/2021   Recent Labs    10/31/20 1122 05/12/21 1319 05/12/21 1342  NA 140  --  134*  K 3.5  --  3.5  CL 104  --  99  CO2 26  --  27  GLUCOSE 149*  --  120*  BUN 6*  --  8  CREATININE 0.56 0.50 0.58  CALCIUM 8.9  --  8.6*  GFRNONAA >60  --  >60  PROT 7.2  --  7.2  ALBUMIN 4.0  --  4.0  AST 23  --  22  ALT 21  --  22  ALKPHOS 97  --  99  BILITOT 0.5  --  0.6    RADIOGRAPHIC STUDIES: I have personally reviewed the radiological images as listed and agreed with the findings in the report. 12/20/2017 Repeat CT scan showed interval decreased of disease. CT images were Independently reviewed by me and discussed with patient. New 37mm right lung lesion, indeterminate.  02/25/2018 CT chest with contrast, presumed evolutionary changes of radiation therapy in the upper right hemithorax.  Aortic atherosclerosis, emphysema. CT CHEST W CONTRAST  Result Date: 05/13/2021 CLINICAL DATA:  70 year old female with history of non-small cell lung cancer. Follow-up study. EXAM: CT CHEST WITH CONTRAST TECHNIQUE: Multidetector CT imaging of the chest was performed during intravenous contrast  administration. CONTRAST:  79mL OMNIPAQUE IOHEXOL 350 MG/ML SOLN COMPARISON:  Chest CT 02/10/2021. FINDINGS: Cardiovascular: Heart size is normal. There is no significant pericardial fluid, thickening or  pericardial calcification. Aortic atherosclerosis. No definite coronary artery calcifications. Mild calcifications of the aortic valve. Mediastinum/Nodes: No pathologically enlarged mediastinal or hilar lymph nodes. Small hiatal hernia. No axillary lymphadenopathy. Lungs/Pleura: Chronic mass-like architectural distortion and volume loss in the medial aspect of the upper right lung predominantly involving the right upper lobe, similar to the prior study, most compatible with chronic postradiation mass-like fibrosis. 6 x 5 mm left lower lobe pulmonary nodule (axial image 85 of series 3), stable. In the posterior aspect of the left lower lobe (axial image 100 of series 3) there is a 7 x 6 mm pulmonary nodule, which is similar to the most recent prior study. No other suspicious appearing pulmonary nodules or masses are noted. No acute consolidative airspace disease. No pleural effusions. Mild diffuse bronchial wall thickening with mild centrilobular and paraseptal emphysema. Upper Abdomen: Aortic atherosclerosis. Musculoskeletal: There are no aggressive appearing lytic or blastic lesions noted in the visualized portions of the skeleton. IMPRESSION: 1. 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 No definitive findings to suggest locally recurrent disease or metastatic disease in the thorax. 2. 7 x 6 mm left lower lobe pulmonary nodule, stable to slightly decreased in size compared to the prior study, favored to be benign (potentially an area of mucoid impaction). Continued attention on follow-up studies is recommended. 3. Other previously noted 6 x 5 mm left lower lobe pulmonary nodule is stable, likely benign. 4. Mild diffuse bronchial wall thickening with mild  centrilobular and paraseptal emphysema; imaging findings suggestive of underlying COPD. 5. There are calcifications of the aortic valve. Echocardiographic correlation for evaluation of potential valvular dysfunction may be warranted if clinically indicated. 6. Aortic atherosclerosis. Aortic Atherosclerosis (ICD10-I70.0) and Emphysema (ICD10-J43.9). Electronically Signed   By: Vinnie Langton M.D.   On: 05/13/2021 10:15      ASSESSMENT & PLAN:   Cancer Staging  Non-small cell lung cancer [facility]) Staging form: Lung, AJCC 8th Edition - Clinical stage from 09/22/2017: Stage Unknown (cTX, cN1, cM0) - Signed by Earlie Server, MD on 09/22/2017 Type of lung cancer: Locally advanced or metastatic non-small cell lung cancer - Pathologic: No stage assigned - Unsigned  1. Non-small cell cancer of right lung ([place])   2. Tobacco abuse    #Clinical stage IIIA lung NSCLC, favoring adenocarcinoma 02/10/2021, CT chest without contrast showed 6 mm left lower lobe nodule is unchanged.  Likely benign. 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 No definitive findings to suggest locally recurrent disease or metastatic disease in the thorax. 7 x 6 mm left lower lobe pulmonary nodule, stable to slightly decreased in size compared to the prior study, favored to be benign (potentially an area of mucoid impaction).. Other previously noted 6 x 5 mm left lower lobe pulmonary nodule, is stable, likely benign. Mild diffuse bronchial wall thickening with mild centrilobular and paraseptal emphysema; calcifications of the aortic valve.aortic atherosclerosis. Repeat CT in 3 months. # chronic macrocytosis with no anemia. Check B12, folate.   #Tobacco abuse, she continues to smoke daily.   RTC 3 months after CT surveillance scan. Orders Placed This Encounter  Procedures   CT CHEST WO CONTRAST    Standing Status:   Future    Standing  Expiration Date:   05/15/2022    Order Specific Question:   Preferred imaging location?    Answer:   [facility]   CBC with Differential/Platelet  Standing Status:   Future    Standing Expiration Date:   05/15/2022   Comprehensive metabolic panel    Standing Status:   Future    Standing Expiration Date:   05/15/2022   TSH    Standing Status:   Future    Standing Expiration Date:   05/15/2022    Earlie Server, MD, PhD

## 2021-08-13 ENCOUNTER — Ambulatory Visit
Admission: RE | Admit: 2021-08-13 | Discharge: 2021-08-13 | Disposition: A | Discharge status: Home / Self Care | Payer: Medicare HMO | Source: Ambulatory Visit | Attending: Oncology | Admitting: Oncology

## 2021-08-13 ENCOUNTER — Other Ambulatory Visit: Payer: Self-pay

## 2021-08-13 DIAGNOSIS — C3491 Malignant neoplasm of unspecified part of right bronchus or lung: Secondary | ICD-10-CM | POA: Diagnosis present

## 2021-08-15 ENCOUNTER — Other Ambulatory Visit: Payer: Medicare HMO

## 2021-08-15 ENCOUNTER — Ambulatory Visit: Payer: Medicare HMO | Admitting: Oncology

## 2021-08-19 ENCOUNTER — Other Ambulatory Visit: Payer: Self-pay

## 2021-08-19 ENCOUNTER — Inpatient Hospital Stay: Payer: Medicare HMO | Admitting: Oncology

## 2021-08-19 ENCOUNTER — Encounter: Payer: Self-pay | Admitting: Oncology

## 2021-08-19 ENCOUNTER — Inpatient Hospital Stay: Payer: Medicare HMO | Attending: Oncology

## 2021-08-19 VITALS — BP 146/70 | HR 100 | Temp 97.7°F | Resp 18 | Wt 176.4 lb

## 2021-08-19 DIAGNOSIS — C3491 Malignant neoplasm of unspecified part of right bronchus or lung: Secondary | ICD-10-CM | POA: Diagnosis not present

## 2021-08-19 DIAGNOSIS — Z85118 Personal history of other malignant neoplasm of bronchus and lung: Secondary | ICD-10-CM | POA: Insufficient documentation

## 2021-08-19 DIAGNOSIS — Z720 Tobacco use: Secondary | ICD-10-CM

## 2021-08-19 DIAGNOSIS — Z87891 Personal history of nicotine dependence: Secondary | ICD-10-CM | POA: Diagnosis not present

## 2021-08-19 DIAGNOSIS — Z923 Personal history of irradiation: Secondary | ICD-10-CM | POA: Insufficient documentation

## 2021-08-19 DIAGNOSIS — D7589 Other specified diseases of blood and blood-forming organs: Secondary | ICD-10-CM

## 2021-08-19 DIAGNOSIS — Z79899 Other long term (current) drug therapy: Secondary | ICD-10-CM | POA: Diagnosis not present

## 2021-08-19 LAB — COMPREHENSIVE METABOLIC PANEL
ALT: 23 U/L (ref 0–44)
AST: 26 U/L (ref 15–41)
Albumin: 4 g/dL (ref 3.5–5.0)
Alkaline Phosphatase: 106 U/L (ref 38–126)
Anion gap: 7 (ref 5–15)
BUN: 10 mg/dL (ref 8–23)
CO2: 27 mmol/L (ref 22–32)
Calcium: 9.4 mg/dL (ref 8.9–10.3)
Chloride: 103 mmol/L (ref 98–111)
Creatinine, Ser: 0.55 mg/dL (ref 0.44–1.00)
GFR, Estimated: >60 mL/min (ref >60)
Glucose, Bld: 142 mg/dL — ABNORMAL HIGH (ref 70–99)
Potassium: 4 mmol/L (ref 3.5–5.1)
Sodium: 137 mmol/L (ref 135–145)
Total Bilirubin: 0.5 mg/dL (ref 0.3–1.2)
Total Protein: 7.4 g/dL (ref 6.5–8.1)

## 2021-08-19 LAB — CBC WITH DIFFERENTIAL/PLATELET
Abs Immature Granulocytes: 0.02 10*3/uL (ref 0.00–0.07)
Basophils Absolute: 0.1 10*3/uL (ref 0.0–0.1)
Basophils Relative: 1 %
Eosinophils Absolute: 0.6 10*3/uL — ABNORMAL HIGH (ref 0.0–0.5)
Eosinophils Relative: 10 %
HCT: 38.8 % (ref 36.0–46.0)
Hemoglobin: 13.2 g/dL (ref 12.0–15.0)
Immature Granulocytes: 0 %
Lymphocytes Relative: 22 %
Lymphs Abs: 1.4 10*3/uL (ref 0.7–4.0)
MCH: 35.2 pg — ABNORMAL HIGH (ref 26.0–34.0)
MCHC: 34 g/dL (ref 30.0–36.0)
MCV: 103.5 fL — ABNORMAL HIGH (ref 80.0–100.0)
Monocytes Absolute: 0.4 10*3/uL (ref 0.1–1.0)
Monocytes Relative: 7 %
Neutro Abs: 3.8 10*3/uL (ref 1.7–7.7)
Neutrophils Relative %: 60 %
Platelets: 292 10*3/uL (ref 150–400)
RBC: 3.75 MIL/uL — ABNORMAL LOW (ref 3.87–5.11)
RDW: 12.8 % (ref 11.5–15.5)
WBC: 6.3 10*3/uL (ref 4.0–10.5)
nRBC: 0 % (ref 0.0–0.2)

## 2021-08-19 LAB — FOLATE: Folate: 15.3 ng/mL (ref >5.9)

## 2021-08-19 LAB — TSH: TSH: 1.202 u[IU]/mL (ref 0.350–4.500)

## 2021-08-19 LAB — VITAMIN B12: Vitamin B-12: 624 pg/mL (ref 180–914)

## 2021-08-19 NOTE — Progress Notes (Signed)
?Hematology/Oncology Progress note ?[phone] [phone] ?  ? ? ? ?Patient Care Team: ?Sharyne Peach, MD as PCP - General (Family Medicine) ?Earlie Server, MD as Medical Oncologist (Medical Oncology) ?Noreene Filbert, MD as Referring Physician (Radiation Oncology) ?Algernon Huxley, MD as Referring Physician (Vascular Surgery) ?Telford Nab, RN as Equities trader ? ?REASON FOR VISIT ?Follow-up for evaluation prior to immunotherapy for stage IIIA non-small cell lung cancer-adenocarcinoma ? ? ?HISTORY OF PRESENTING ILLNESS:  ?Connie Powers is a  71 y.o.  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  ?CT lung cancer screen 05/27/2017   ?1. A few bilateral pulmonary nodules measuring up to 5 mm are indeterminate. Considering the suspicious 2.0 cm mediastinal adenopathy, ?follow up with PET/CT is recommended. 2. Mild bronchial wall thickening could represent airway disease including ?bronchitis.ACR Lung-RADS Category and Recommendation*: ACR Lung-RADS Category 2S (S modifier for mediastinal adenopathy) ? ?PET scan 3/112019 ?1. No hypermetabolic pulmonary nodules.2. Hypermetabolic enlarged right paratracheal lymph node, of uncertain etiology in isolation. Lymphoproliferative disorder cannot be excluded. 3.  Aortic atherosclerosis (ICD10-170.0). ? ?EBUS biopsy of paratracheal node showed non small cell lung cancer favoring adenocarcinoma. Case was discussed on tumor board on 09/16/2017 and consensus recommendation is to treat as Stage IIIA disease given the invasion of mediastinum.  ? ?# She has had brain aneurysm and has a clip. MRI brain is contraindicated. CT brain negative for metastatic disease.  ?During the interval patient also was referred to vascular surgery for evaluation of malposition of Medi port.  Patient's right jugular port was removed and right internal jugular vein Mediport was placed by  Dr.Dew on 8/5 2019. ? ?# CT chest scan done 06/02/2018 which was independent reviewed by me. ?CT chest showed radiation changes in the medial right upper lobe/paramediastinal region.  No evidence of recurrent or metastatic disease. ?Nonocclusive pericatheter thrombus/fibrin sheath along patient's right chest port. ?06/06/2018 US venous upper right extremity ultrasound showed nonocclusive thrombus in the right jugular vein.  The port catheter can be seen associated with this thrombus.  The jugular vein is partially occlusive.  There is also partially occlusive thrombus in the right subclavian vein.  The right axillary, brachial, radial, and ulnar veins are compressible.  Doppler analysis demonstrated phasicity of the Doppler waveform. ?Patient was started on Eliquis for anticoagulation. ? ?02/10/2021, CT chest without contrast showed 6 mm left lower lobe nodule is unchanged.  Likely benign. 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 No definitive findings to suggest locally recurrent disease or metastatic disease in the thorax. ?7 x 6 mm left lower lobe pulmonary nodule, stable to slightly decreased in size compared to the prior study, favored to be benign (potentially an area of mucoid impaction).. Other previously noted 6 x 5 mm left lower lobe pulmonary nodule, is stable, likely benign. ?Mild diffuse bronchial wall thickening with mild centrilobular ?and paraseptal emphysema; calcifications of the aortic valve.aortic atherosclerosis. ? ?Current Treatment ?S/p concurrent Chemotherapy [name] /taxol  Weekly x 6] and RT.  ?Started on maintenance durvalumab every 2 weeks on 12/27/2017. 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 ?Finish 1 year of Durvalumab [ 26 cycles] on 12/12/2018 ? ?INTERVAL HISTORY ?Connie Powers is a 71 y.o. female who has above history reviewed by me today presents for follow-up of  stage III non-small cell lung cancer ?Patient continues to  smoke a few cigarettes daily. ?She has had surveillance CT scan done during interval and presents to discuss results. ? No new complaints. ? ?Review of Systems  ?Constitutional:  Negative for appetite change, chills, fatigue and fever.  ?HENT:   Negative for hearing loss and voice change.   ?Eyes:  Negative for eye problems.  ?Respiratory:  Negative for chest tightness and cough.   ?     Chronic cough  ?Cardiovascular:  Negative for chest pain.  ?Gastrointestinal:  Negative for abdominal distention, abdominal pain and blood in stool.  ?Endocrine: Negative for hot flashes.  ?Genitourinary:  Negative for difficulty urinating and frequency.   ?Musculoskeletal:  Negative for arthralgias.  ?Skin:  Negative for itching and rash.  ?Neurological:  Negative for extremity weakness.  ?Hematological:  Negative for adenopathy.  ?Psychiatric/Behavioral:  Negative for confusion.   ? ?MEDICAL HISTORY:  ?Past Medical History:  ?Diagnosis Date  ? Brain aneurysm   ? Difficult intubation   ? Hypertension   ? Non-small cell cancer of right lung ([place]) 09/22/2017  ? Chemo + Rad tx's.   ? Viral meningitis   ? ? ?SURGICAL HISTORY: ?Past Surgical History:  ?Procedure Laterality Date  ? ENDOBRONCHIAL ULTRASOUND N/A 09/09/2017  ? Procedure: ENDOBRONCHIAL ULTRASOUND;  Surgeon: Laverle Hobby, MD;  Location: [facility];  Service: Pulmonary;  Laterality: N/A;  ? OOPHORECTOMY Left   ? PORTA CATH INSERTION N/A 09/29/2017  ? Procedure: PORTA CATH INSERTION;  Surgeon: Algernon Huxley, MD;  Location: [place] [facility];  Service: Cardiovascular;  Laterality: N/A;  ? PORTA CATH INSERTION N/A 01/03/2018  ? Procedure: PORTA CATH INSERTION;  Surgeon: Algernon Huxley, MD;  Location: [place] [facility];  Service: Cardiovascular;  Laterality: N/A;  ? PORTA CATH REMOVAL N/A 01/02/2019  ? Procedure: PORTA CATH REMOVAL;  Surgeon: Algernon Huxley, MD;  Location: [place] [facility];  Service: Cardiovascular;   Laterality: N/A;  ? surgical repair for brain tumor  1969  ? clips in head  ? WISDOM TOOTH EXTRACTION    ? ? ?SOCIAL HISTORY: ?Social History  ? ?Socioeconomic History  ? Marital status: Married  ?  Spouse name: Not on file  ? Number of children: Not on file  ? Years of education: Not on file  ? Highest education level: Not on file  ?Occupational History  ? Not on file  ?Tobacco Use  ? Smoking status: Former  ?  Packs/day: 0.25  ?  Types: Cigarettes  ?  Quit date: 10/03/2017  ?  Years since quitting: 3.8  ? Smokeless tobacco: Never  ?Vaping Use  ? Vaping Use: Never used  ?Substance and Sexual Activity  ? Alcohol use: Not Currently  ?  Comment: social  ? Drug use: Not Currently  ?  Types: Cocaine  ? Sexual activity: Not on file  ?Other Topics Concern  ? Not on file  ?Social History Narrative  ? Not on file  ? ?Social Determinants of Health  ? ?Financial Resource Strain: Not on file  ?Food Insecurity: Not on file  ?Transportation Needs: Not on file  ?Physical Activity: Not on file  ?Stress: Not on file  ?Social Connections: Not on file  ?Intimate Partner Violence: Not on file  ? ? ?FAMILY HISTORY: ?Family History  ?Problem Relation Age of Onset  ? Diabetes Mother   ? Hypertension Mother   ? Hyperlipidemia Mother   ? Arthritis Mother   ? Hypertension Father   ? Hyperlipidemia Father   ? Heart attack  Father   ?     x2  ? Breast cancer Neg Hx   ? ? ?ALLERGIES:  has No Known Allergies. ? ?MEDICATIONS:  ?Current Outpatient Medications  ?Medication Sig Dispense Refill  ? amLODipine (NORVASC) 10 MG tablet Take 10 mg by mouth daily.  11  ? Cyanocobalamin (B-12) 1000 MCG CAPS Take 1,000 mcg by mouth daily. 30 capsule 3  ? docusate sodium (COLACE) 100 MG capsule Take 100 mg by mouth daily.    ? pravastatin (PRAVACHOL) 20 MG tablet Take 1 tablet by mouth at bedtime.    ? ?No current facility-administered medications for this visit.  ? ? ? ?PHYSICAL EXAMINATION: ?ECOG PERFORMANCE STATUS: 0 - Asymptomatic ?Vitals:  ? 08/19/21 1451   ?BP: (!) 146/70  ?Pulse: 100  ?Resp: 18  ?Temp: 97.7 ?F (36.5 ?C)  ?SpO2: 97%  ? ?Filed Weights  ? 08/19/21 1451  ?Weight: 176 lb 6.4 oz (80 kg)  ? ? ?Physical Exam ?Constitutional:   ?   General: She i

## 2021-08-23 ENCOUNTER — Encounter: Payer: Self-pay | Admitting: Oncology

## 2021-11-20 ENCOUNTER — Ambulatory Visit
Admission: RE | Admit: 2021-11-20 | Discharge: 2021-11-20 | Disposition: A | Discharge status: Home / Self Care | Payer: Medicare HMO | Source: Ambulatory Visit | Attending: Radiation Oncology | Admitting: Radiation Oncology

## 2021-11-20 VITALS — BP 152/77 | HR 79 | Temp 96.8°F | Resp 20 | Ht 65.0 in | Wt 174.5 lb

## 2021-11-20 DIAGNOSIS — Z85118 Personal history of other malignant neoplasm of bronchus and lung: Secondary | ICD-10-CM | POA: Diagnosis present

## 2021-11-20 DIAGNOSIS — Z923 Personal history of irradiation: Secondary | ICD-10-CM | POA: Insufficient documentation

## 2021-11-20 DIAGNOSIS — C3491 Malignant neoplasm of unspecified part of right bronchus or lung: Secondary | ICD-10-CM

## 2022-02-09 ENCOUNTER — Inpatient Hospital Stay: Payer: Medicare HMO | Attending: Oncology

## 2022-02-09 ENCOUNTER — Ambulatory Visit
Admission: RE | Admit: 2022-02-09 | Discharge: 2022-02-09 | Disposition: A | Discharge status: Home / Self Care | Payer: Medicare HMO | Source: Ambulatory Visit | Attending: Oncology | Admitting: Oncology

## 2022-02-09 DIAGNOSIS — Z9221 Personal history of antineoplastic chemotherapy: Secondary | ICD-10-CM | POA: Insufficient documentation

## 2022-02-09 DIAGNOSIS — C3491 Malignant neoplasm of unspecified part of right bronchus or lung: Secondary | ICD-10-CM | POA: Diagnosis present

## 2022-02-09 DIAGNOSIS — Z85118 Personal history of other malignant neoplasm of bronchus and lung: Secondary | ICD-10-CM | POA: Insufficient documentation

## 2022-02-09 DIAGNOSIS — Z923 Personal history of irradiation: Secondary | ICD-10-CM | POA: Insufficient documentation

## 2022-02-09 DIAGNOSIS — Z87891 Personal history of nicotine dependence: Secondary | ICD-10-CM | POA: Insufficient documentation

## 2022-02-09 LAB — POCT I-STAT CREATININE: Creatinine, Ser: 0.5 mg/dL (ref 0.44–1.00)

## 2022-02-09 MED ORDER — IOHEXOL 300 MG/ML  SOLN
100.0000 mL | Freq: Once | INTRAMUSCULAR | Status: AC | PRN
  Administered 2022-02-09: 100 mL via INTRAVENOUS

## 2022-02-11 ENCOUNTER — Encounter: Payer: Self-pay | Admitting: Oncology

## 2022-02-11 ENCOUNTER — Inpatient Hospital Stay (HOSPITAL_BASED_OUTPATIENT_CLINIC_OR_DEPARTMENT_OTHER): Payer: Medicare HMO | Admitting: Oncology

## 2022-02-11 VITALS — BP 138/74 | HR 83 | Temp 98.6°F | Resp 18 | Wt 173.0 lb

## 2022-02-11 DIAGNOSIS — Z85118 Personal history of other malignant neoplasm of bronchus and lung: Secondary | ICD-10-CM | POA: Diagnosis present

## 2022-02-11 DIAGNOSIS — C3491 Malignant neoplasm of unspecified part of right bronchus or lung: Secondary | ICD-10-CM | POA: Diagnosis not present

## 2022-02-11 DIAGNOSIS — Z9221 Personal history of antineoplastic chemotherapy: Secondary | ICD-10-CM | POA: Diagnosis not present

## 2022-02-11 DIAGNOSIS — Z87891 Personal history of nicotine dependence: Secondary | ICD-10-CM | POA: Diagnosis not present

## 2022-02-11 DIAGNOSIS — Z923 Personal history of irradiation: Secondary | ICD-10-CM | POA: Diagnosis not present

## 2022-02-11 NOTE — Progress Notes (Signed)
Hematology/Oncology Progress note [phone] [phone]      Patient Care Team: Sharyne Peach, MD as PCP - General (Family Medicine) Earlie Server, MD as Medical Oncologist (Medical Oncology) Noreene Filbert, MD as Referring Physician (Radiation Oncology) Lucky Cowboy Erskine Squibb, MD as Referring Physician (Vascular Surgery) Telford Nab, RN as Registered Nurse  REASON FOR VISIT Follow-up for stage IIIA non-small cell lung cancer-adenocarcinoma   HISTORY OF PRESENTING ILLNESS:  Connie Powers is a  71 y.o.  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  CT lung cancer screen 05/27/2017   1. A few bilateral pulmonary nodules measuring up to 5 mm are indeterminate. Considering the suspicious 2.0 cm mediastinal adenopathy, follow up with PET/CT is recommended. 2. Mild bronchial wall thickening could represent airway disease including bronchitis.ACR Lung-RADS Category and Recommendation*: ACR Lung-RADS Category 2S (S modifier for mediastinal adenopathy)  PET scan 3/112019 1. No hypermetabolic pulmonary nodules.2. Hypermetabolic enlarged right paratracheal lymph node, of uncertain etiology in isolation. Lymphoproliferative disorder cannot be excluded. 3.  Aortic atherosclerosis (ICD10-170.0).  EBUS biopsy of paratracheal node showed non small cell lung cancer favoring adenocarcinoma. Case was discussed on tumor board on 09/16/2017 and consensus recommendation is to treat as Stage IIIA disease given the invasion of mediastinum.   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 MRI brain is contraindicated. CT brain negative for metastatic disease.  During the interval patient also was referred to vascular surgery for evaluation of malposition of Medi port.  Patient's right jugular port was removed and right internal jugular vein Mediport was placed by Dr.Dew on 8/5 2019.  # CT chest scan  done 06/02/2018 which was independent reviewed by me. CT chest showed radiation changes in the medial right upper lobe/paramediastinal region.  No evidence of recurrent or metastatic disease. Nonocclusive pericatheter thrombus/fibrin sheath along patient's right chest port. 06/06/2018 US venous upper right extremity ultrasound showed nonocclusive thrombus in the right jugular vein.  The port catheter can be seen associated with this thrombus.  The jugular vein is partially occlusive.  There is also partially occlusive thrombus in the right subclavian vein.  The right axillary, brachial, radial, and ulnar veins are compressible.  Doppler analysis demonstrated phasicity of the Doppler waveform. Patient was started on Eliquis for anticoagulation.  02/10/2021, CT chest without contrast showed 6 mm left lower lobe nodule is unchanged.  Likely benign. 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 No definitive findings to suggest locally recurrent disease or metastatic disease in the thorax. 7 x 6 mm left lower lobe pulmonary nodule, stable to slightly decreased in size compared to the prior study, favored to be benign (potentially an area of mucoid impaction).. Other previously noted 6 x 5 mm left lower lobe pulmonary nodule, is stable, likely benign. Mild diffuse bronchial wall thickening with mild centrilobular and paraseptal emphysema; calcifications of the aortic valve.aortic atherosclerosis.  Treatment S/p concurrent Chemotherapy [name]  Weekly x 6] and RT.  Started on maintenance durvalumab every 2 weeks on 12/27/2017. 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 Finish 1 year of Durvalumab [ 26 cycles] on 12/12/2018  08/13/2021 CT chest wo contrast showed 1. Unchanged post treatment/post radiation appearance of the perihilar and suprahilar right upper lobe. No evidence of malignant  recurrence.2. Unchanged small nodule of the dependent left lower lobe.3. Diminished small nodule  of the dependent left lower lobe, most consistent with resolving nonspecific infection or inflammation.4. Emphysema and diffuse bilateral bronchial wall thickening. Aortic atherosclerosis.   INTERVAL HISTORY Connie Powers is a 71 y.o. female who has above history reviewed by me today presents for follow-up of stage III non-small cell lung cancer She quitted smoking. Patient had surveillance CT scan done.  Present to discuss results.  No new complaints.  Chronic shortness of breath unchanged.  Review of Systems  Constitutional:  Negative for appetite change, chills, fatigue and fever.  HENT:   Negative for hearing loss and voice change.   Eyes:  Negative for eye problems.  Respiratory:  Positive for cough and shortness of breath. Negative for chest tightness.        Chronic cough  Cardiovascular:  Negative for chest pain.  Gastrointestinal:  Negative for abdominal distention, abdominal pain and blood in stool.  Endocrine: Negative for hot flashes.  Genitourinary:  Negative for difficulty urinating and frequency.   Musculoskeletal:  Negative for arthralgias.  Skin:  Negative for itching and rash.  Neurological:  Negative for extremity weakness.  Hematological:  Negative for adenopathy.  Psychiatric/Behavioral:  Negative for confusion.     MEDICAL HISTORY:  Past Medical History:  Diagnosis Date   Brain aneurysm    Difficult intubation    Hypertension    Non-small cell cancer of right lung ([place]) 09/22/2017   Chemo + Rad tx's.    Viral meningitis     SURGICAL HISTORY: Past Surgical History:  Procedure Laterality Date   ENDOBRONCHIAL ULTRASOUND N/A 09/09/2017   Procedure: ENDOBRONCHIAL ULTRASOUND;  Surgeon: Laverle Hobby, MD;  Location: [facility];  Service: Pulmonary;  Laterality: N/A;   OOPHORECTOMY Left    PORTA CATH INSERTION N/A 09/29/2017   Procedure: PORTA CATH  INSERTION;  Surgeon: Algernon Huxley, MD;  Location: [facility];  Service: Cardiovascular;  Laterality: N/A;   PORTA CATH INSERTION N/A 01/03/2018   Procedure: PORTA CATH INSERTION;  Surgeon: Algernon Huxley, MD;  Location: [facility];  Service: Cardiovascular;  Laterality: N/A;   PORTA CATH REMOVAL N/A 01/02/2019   Procedure: PORTA CATH REMOVAL;  Surgeon: Algernon Huxley, MD;  Location: [facility];  Service: Cardiovascular;  Laterality: N/A;   surgical repair for brain tumor  1969   clips in head   WISDOM TOOTH EXTRACTION      SOCIAL HISTORY: Social History   Socioeconomic History   Marital status: Married    Spouse name: Not on file   Number of children: Not on file   Years of education: Not on file   Highest education level: Not on file  Occupational History   Not on file  Tobacco Use   Smoking status: Former    Packs/day: 0.25    Types: Cigarettes    Quit date: 10/03/2017    Years since quitting: 4.3   Smokeless tobacco: Never  Vaping Use   Vaping Use: Never used  Substance and Sexual Activity   Alcohol use: Not Currently    Comment: social   Drug use: Not Currently    Types: Cocaine   Sexual activity: Not on file  Other Topics Concern   Not on file  Social History Narrative   Not on file   Social Determinants of Health   Financial Resource Strain: Not on file  Food Insecurity: Not on file  Transportation Needs: Not on file  Physical Activity: Not on file  Stress: Not on file  Social Connections: Not on file  Intimate Partner Violence: Not on file    FAMILY HISTORY: Family History  Problem Relation Age of Onset   Diabetes Mother    Hypertension Mother    Hyperlipidemia Mother    Arthritis Mother    Hypertension Father    Hyperlipidemia Father    Heart attack Father        x2   Breast cancer Neg Hx     ALLERGIES:  has No Known Allergies.  MEDICATIONS:  Current Outpatient Medications  Medication Sig Dispense Refill   amLODipine  (NORVASC) 10 MG tablet Take 10 mg by mouth daily.  11   Cyanocobalamin (B-12) 1000 MCG CAPS Take 1,000 mcg by mouth daily. 30 capsule 3   docusate sodium (COLACE) 100 MG capsule Take 100 mg by mouth daily.     pravastatin (PRAVACHOL) 20 MG tablet Take 1 tablet by mouth at bedtime.     No current facility-administered medications for this visit.     PHYSICAL EXAMINATION: ECOG PERFORMANCE STATUS: 0 - Asymptomatic Vitals:   02/11/22 1332  BP: 138/74  Pulse: 83  Resp: 18  Temp: 98.6 F (37 C)  SpO2: 100%   Filed Weights   02/11/22 1332  Weight: 173 lb (78.5 kg)    Physical Exam Constitutional:      General: She is not in acute distress. HENT:     Head: Normocephalic and atraumatic.  Eyes:     General: No scleral icterus.       Left eye: No discharge.  Cardiovascular:     Rate and Rhythm: Normal rate and regular rhythm.     Heart sounds: No murmur heard. Pulmonary:     Effort: Pulmonary effort is normal. No respiratory distress.     Comments: Decreased breath sound bilaterally Abdominal:     General: Bowel sounds are normal. There is no distension.     Palpations: Abdomen is soft. There is no mass.  Musculoskeletal:        General: No deformity. Normal range of motion.     Cervical back: Normal range of motion and neck supple.  Lymphadenopathy:     Cervical: No cervical adenopathy.  Skin:    General: Skin is warm and dry.     Findings: No erythema or rash.  Neurological:     Mental Status: She is alert and oriented to person, place, and time. Mental status is at baseline.     Cranial Nerves: No cranial nerve deficit.     Coordination: Coordination normal.  Psychiatric:        Mood and Affect: Mood normal.      LABORATORY DATA:  I have reviewed the data as listed    Latest Ref Rng & Units 08/19/2021    2:40 PM 05/12/2021    1:42 PM 10/31/2020   11:22 AM  CBC  WBC 4.0 - 10.5 K/uL 6.3  4.7  5.4   Hemoglobin 12.0 - 15.0 g/dL 13.2  14.8  14.0   Hematocrit 36.0  - 46.0 % 38.8  44.0  40.7   Platelets 150 - 400 K/uL 292  266  288        Latest Ref Rng & Units 02/09/2022    2:59 PM 08/19/2021    2:40 PM 05/12/2021    1:42 PM  CMP  Glucose 70 - 99 mg/dL  142  120   BUN 8 - 23 mg/dL  10  8   Creatinine 0.44 - 1.00 mg/dL 0.50  0.55  0.58   Sodium 135 - 145 mmol/L  137  134   Potassium 3.5 - 5.1 mmol/L  4.0  3.5   Chloride 98 - 111 mmol/L  103  99   CO2 22 - 32 mmol/L  27  27   Calcium 8.9 - 10.3 mg/dL  9.4  8.6   Total Protein 6.5 - 8.1 g/dL  7.4  7.2   Total Bilirubin 0.3 - 1.2 mg/dL  0.5  0.6   Alkaline Phos 38 - 126 U/L  106  99   AST 15 - 41 U/L  26  22   ALT 0 - 44 U/L  23  22     RADIOGRAPHIC STUDIES: I have personally reviewed the radiological images as listed and agreed with the findings in the report. 12/20/2017 Repeat CT scan showed interval decreased of disease. CT images were Independently reviewed by me and discussed with patient. New 87mm right lung lesion, indeterminate.  02/25/2018 CT chest with contrast, presumed evolutionary changes of radiation therapy in the upper right hemithorax.  Aortic atherosclerosis, emphysema. CT CHEST ABDOMEN PELVIS W CONTRAST  Result Date: 02/10/2022 CLINICAL DATA:  Non-small cell lung cancer restaging, status post chemotherapy and radiation * Tracking Code: BO * EXAM: CT CHEST, ABDOMEN, AND PELVIS WITH CONTRAST TECHNIQUE: Multidetector CT imaging of the chest, abdomen and pelvis was performed following the standard protocol during bolus administration of intravenous contrast. RADIATION DOSE REDUCTION: This exam was performed according to the departmental dose-optimization program which includes automated exposure control, adjustment of the mA and/or kV according to patient size and/or use of iterative reconstruction technique. CONTRAST:  140mL OMNIPAQUE IOHEXOL 300 MG/ML SOLN additional oral enteric contrast COMPARISON:  CT chest, 08/13/2021, PET-CT, 08/09/2017 FINDINGS: CT CHEST FINDINGS Cardiovascular:  Aortic atherosclerosis. Aortic valve calcifications. 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 Normal heart size. No pericardial effusion. Mediastinum/Nodes: No enlarged mediastinal, hilar, or axillary lymph nodes. Small hiatal hernia. Thyroid gland, trachea, and esophagus demonstrate no significant findings. Lungs/Pleura: Unchanged post treatment/post radiation consolidation and fibrosis of the perihilar and suprahilar right upper lobe. Mild underlying centrilobular and paraseptal emphysema. Diffuse bilateral bronchial wall thickening. Unchanged 0.6 cm nodule of the dependent left lower lobe (series 4, image 90). Previously seen small nodule of the more inferior dependent left lower lobe is resolved (series 4, image 105). No new nodules. No pleural effusion or pneumothorax. Musculoskeletal: No chest wall abnormality. No acute osseous findings. CT ABDOMEN PELVIS FINDINGS Hepatobiliary: No solid liver abnormality is seen. No gallstones, gallbladder wall thickening, or biliary dilatation. Pancreas: Unremarkable. No pancreatic ductal dilatation or surrounding inflammatory changes. Spleen: Normal in size without significant abnormality. Adrenals/Urinary Tract: Adrenal glands are unremarkable. Kidneys are normal, without renal calculi, solid lesion, or hydronephrosis. Bladder is unremarkable. Stomach/Bowel: Stomach is within normal limits. Appendix appears normal. No evidence of bowel wall thickening, distention, or inflammatory changes. Sigmoid diverticula. Vascular/Lymphatic: Aortic atherosclerosis. No enlarged abdominal or pelvic lymph nodes. Reproductive: Uterine fibroids. Stable, benign left ovarian cyst measuring 2.4 x 1.3 cm (series 2, image 104). Other: No abdominal wall hernia or abnormality. No ascites. Musculoskeletal: No acute osseous findings. IMPRESSION: 1. Unchanged post treatment/post radiation consolidation and  fibrosis of the perihilar and suprahilar right upper lobe. 2. Unchanged 0.6 cm nodule of the dependent left lower lobe. Previously seen small nodule of the more inferior dependent left lower lobe is resolved. No new nodules. 3. No evidence of lymphadenopathy or metastatic disease  in the chest, abdomen or pelvis. 4. Emphysema and diffuse bilateral bronchial wall thickening. 5. 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 Aortic Atherosclerosis (ICD10-I70.0) and Emphysema (ICD10-J43.9). Electronically Signed   By: Delanna Ahmadi M.D.   On: 02/10/2022 13:09      ASSESSMENT & PLAN:   Cancer Staging  Non-small cell lung cancer [facility]) Staging form: Lung, AJCC 8th Edition - Clinical stage from 09/22/2017: Stage Unknown (cTX, cN1, cM0) - Signed by Earlie Server, MD on 09/22/2017 Type of lung cancer: Locally advanced or metastatic non-small cell lung cancer - Pathologic: No stage assigned - Unsigned  1. Non-small cell cancer of right lung (HCC)    #Clinical stage IIIA lung NSCLC, favoring adenocarcinoma 9 06/01/2021, CT chest abdomen pelvis with contrast showed unchanged posttreatment/postradiation consolidation and fibrosis of the perihilar/Suprahilar right upper lobe.  Unchanged 0.6 cm nodule of the left upper lobe. Previously seen small nodule of the more inferior dependent left lower lobe is resolved.  No new nodules.  No evidence of lymphadenopathy or metastatic disease in the chest abdomen or pelvis.  Emphysema and a diffuse bilateral bronchial wall thickening.  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 Recommend patient to repeat CT in 6  months.  No blood work was obtained.  She did have blood work  done with primary care provider later this week. #Former smoker, encourage smoke cessation referred. RTC 6 months after CT surveillance scan. Orders Placed This Encounter  Procedures   CT Chest Wo Contrast    Standing Status:   Future    Standing Expiration Date:   02/11/2023    Order Specific Question:   Preferred imaging location?    Answer:   [facility]   CBC with Differential/Platelet    Standing Status:   Future    Standing Expiration Date:   02/12/2023   Comprehensive metabolic panel    Standing Status:   Future    Standing Expiration Date:   02/11/2023   We spent sufficient time to discuss many aspect of care, questions were answered to patient's satisfaction. A total of 25 minutes was spent on this visit.  With 5 minutes spent reviewing image findings,  15 minutes counseling the patient on the diagnosis, surveillance plan.  Additional 5 minutes was spent on answering patient's questions.  Earlie Server, MD, PhD

## 2022-02-11 NOTE — Progress Notes (Signed)
Pt here for follow up. No new concerns voiced.   

## 2022-03-02 ENCOUNTER — Other Ambulatory Visit: Payer: Self-pay | Admitting: Family Medicine

## 2022-03-02 DIAGNOSIS — Z780 Asymptomatic menopausal state: Secondary | ICD-10-CM

## 2022-08-05 ENCOUNTER — Ambulatory Visit
Admission: RE | Admit: 2022-08-05 | Discharge: 2022-08-05 | Disposition: A | Discharge status: Home / Self Care | Payer: Medicare HMO | Source: Ambulatory Visit | Attending: Oncology | Admitting: Oncology

## 2022-08-05 DIAGNOSIS — C3491 Malignant neoplasm of unspecified part of right bronchus or lung: Secondary | ICD-10-CM | POA: Insufficient documentation

## 2022-08-12 ENCOUNTER — Inpatient Hospital Stay: Payer: Medicare HMO | Admitting: Oncology

## 2022-08-12 ENCOUNTER — Encounter: Payer: Self-pay | Admitting: Oncology

## 2022-08-12 ENCOUNTER — Inpatient Hospital Stay: Payer: Medicare HMO | Attending: Oncology

## 2022-08-12 VITALS — BP 136/63 | HR 86 | Temp 96.5°F | Resp 15 | Wt 174.6 lb

## 2022-08-12 DIAGNOSIS — Z923 Personal history of irradiation: Secondary | ICD-10-CM | POA: Diagnosis not present

## 2022-08-12 DIAGNOSIS — R0602 Shortness of breath: Secondary | ICD-10-CM | POA: Diagnosis not present

## 2022-08-12 DIAGNOSIS — Z7901 Long term (current) use of anticoagulants: Secondary | ICD-10-CM | POA: Insufficient documentation

## 2022-08-12 DIAGNOSIS — Z79899 Other long term (current) drug therapy: Secondary | ICD-10-CM | POA: Insufficient documentation

## 2022-08-12 DIAGNOSIS — D7589 Other specified diseases of blood and blood-forming organs: Secondary | ICD-10-CM

## 2022-08-12 DIAGNOSIS — I10 Essential (primary) hypertension: Secondary | ICD-10-CM | POA: Diagnosis not present

## 2022-08-12 DIAGNOSIS — Z9221 Personal history of antineoplastic chemotherapy: Secondary | ICD-10-CM | POA: Diagnosis not present

## 2022-08-12 DIAGNOSIS — Z87891 Personal history of nicotine dependence: Secondary | ICD-10-CM | POA: Diagnosis not present

## 2022-08-12 DIAGNOSIS — C3490 Malignant neoplasm of unspecified part of unspecified bronchus or lung: Secondary | ICD-10-CM

## 2022-08-12 DIAGNOSIS — C3491 Malignant neoplasm of unspecified part of right bronchus or lung: Secondary | ICD-10-CM

## 2022-08-12 DIAGNOSIS — Z85118 Personal history of other malignant neoplasm of bronchus and lung: Secondary | ICD-10-CM | POA: Diagnosis not present

## 2022-08-12 LAB — COMPREHENSIVE METABOLIC PANEL
ALT: 19 U/L (ref 0–44)
AST: 28 U/L (ref 15–41)
Albumin: 4 g/dL (ref 3.5–5.0)
Alkaline Phosphatase: 92 U/L (ref 38–126)
Anion gap: 7 (ref 5–15)
BUN: 7 mg/dL — ABNORMAL LOW (ref 8–23)
CO2: 26 mmol/L (ref 22–32)
Calcium: 9 mg/dL (ref 8.9–10.3)
Chloride: 103 mmol/L (ref 98–111)
Creatinine, Ser: 0.54 mg/dL (ref 0.44–1.00)
GFR, Estimated: >60 mL/min (ref >60)
Glucose, Bld: 194 mg/dL — ABNORMAL HIGH (ref 70–99)
Potassium: 3.4 mmol/L — ABNORMAL LOW (ref 3.5–5.1)
Sodium: 136 mmol/L (ref 135–145)
Total Bilirubin: 0.5 mg/dL (ref 0.3–1.2)
Total Protein: 7.3 g/dL (ref 6.5–8.1)

## 2022-08-12 LAB — CBC WITH DIFFERENTIAL/PLATELET
Abs Immature Granulocytes: 0.03 10*3/uL (ref 0.00–0.07)
Basophils Absolute: 0 10*3/uL (ref 0.0–0.1)
Basophils Relative: 1 %
Eosinophils Absolute: 0.2 10*3/uL (ref 0.0–0.5)
Eosinophils Relative: 3 %
HCT: 38 % (ref 36.0–46.0)
Hemoglobin: 12.6 g/dL (ref 12.0–15.0)
Immature Granulocytes: 1 %
Lymphocytes Relative: 22 %
Lymphs Abs: 1.4 10*3/uL (ref 0.7–4.0)
MCH: 34.6 pg — ABNORMAL HIGH (ref 26.0–34.0)
MCHC: 33.2 g/dL (ref 30.0–36.0)
MCV: 104.4 fL — ABNORMAL HIGH (ref 80.0–100.0)
Monocytes Absolute: 0.4 10*3/uL (ref 0.1–1.0)
Monocytes Relative: 6 %
Neutro Abs: 4.3 10*3/uL (ref 1.7–7.7)
Neutrophils Relative %: 67 %
Platelets: 277 10*3/uL (ref 150–400)
RBC: 3.64 MIL/uL — ABNORMAL LOW (ref 3.87–5.11)
RDW: 12.4 % (ref 11.5–15.5)
WBC: 6.3 10*3/uL (ref 4.0–10.5)
nRBC: 0 % (ref 0.0–0.2)

## 2022-08-12 NOTE — Assessment & Plan Note (Signed)
She is currently asymptomatic.  Hemoglobin has been stable.  Continue monitor.

## 2022-08-12 NOTE — Progress Notes (Signed)
Hematology/Oncology Progress note [phone] [phone]     REASON FOR VISIT Follow-up for stage IIIA non-small cell lung cancer-adenocarcinoma  ASSESSMENT & PLAN:   Non-small cell lung cancer ([place]) Clinical stage IIIA lung NSCLC, favoring adenocarcinoma, status post concurrent chemotherapy radiation followed by 1 year of durvalumab every 2 weeks. CT scan was independently reviewed and discussed with patient.  No recurrence. She is almost 5 years post for history of concurrent chemoradiation.   Patient prefers to continue follow-up with oncology and continue image surveillance. Recommend to repeat CT scan in a year.   Macrocytosis without anemia She is currently asymptomatic.  Hemoglobin has been stable.  Continue monitor.  Orders Placed This Encounter  Procedures   CT CHEST ABDOMEN PELVIS W CONTRAST    Standing Status:   Future    Standing Expiration Date:   08/12/2023    Order Specific Question:   If indicated for the ordered procedure, I authorize the administration of contrast media per Radiology protocol    Answer:   Yes    Order Specific Question:   Does the patient have a contrast media/X-ray dye allergy?    Answer:   No    Order Specific Question:   Preferred imaging location?    Answer:   [facility]    Order Specific Question:   Is Oral Contrast requested for this exam?    Answer:   Yes, Per Radiology protocol   CBC with Differential/Platelet    Standing Status:   Future    Standing Expiration Date:   08/12/2023   CMP ([facility] only)    Standing Status:   Future    Standing Expiration Date:   08/12/2023   Vitamin B12    Standing Status:   Future    Standing Expiration Date:   08/12/2023   Folate    Standing Status:   Future    Standing Expiration Date:   08/12/2023   Technologist smear review    Standing Status:   Future    Standing Expiration Date:   08/12/2023    Order Specific Question:   Clinical information:    Answer:    Non-small cell lung cancer restaging   Follow-up in 1 year. All questions were answered. The patient knows to call the clinic with any problems, questions or concerns.  Earlie Server, MD, PhD [facility] Hematology Oncology 08/12/2022   HISTORY OF PRESENTING ILLNESS:  Connie Powers is a  72 y.o.  female with PMH listed below who was referred to me for evaluation of clinically Stage IIIA non small cell lung cancer.  CT lung cancer screen 05/27/2017   1. A few bilateral pulmonary nodules measuring up to 5 mm are indeterminate. Considering the suspicious 2.0 cm mediastinal adenopathy, follow up with PET/CT is recommended. 2. Mild bronchial wall thickening could represent airway disease including bronchitis.ACR Lung-RADS Category and Recommendation*: ACR Lung-RADS Category 2S (S modifier for mediastinal adenopathy)  PET scan 3/112019 1. No hypermetabolic pulmonary nodules.2. Hypermetabolic enlarged right paratracheal lymph node, of uncertain etiology in isolation. Lymphoproliferative disorder cannot be excluded. 3.  Aortic atherosclerosis (ICD10-170.0).  EBUS biopsy of paratracheal node showed non small cell lung cancer favoring adenocarcinoma. Case was discussed on tumor board on 09/16/2017 and consensus recommendation is to treat as Stage IIIA disease given the invasion of mediastinum.   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 MRI brain is contraindicated. CT brain negative for metastatic disease.  During the interval patient also was  referred to vascular surgery for evaluation of malposition of Medi port.  Patient's right jugular port was removed and right internal jugular vein Mediport was placed by Dr.Dew on 8/5 2019.  # CT chest scan done 06/02/2018 which was independent reviewed by me. CT chest showed radiation changes in the medial right upper lobe/paramediastinal region.  No evidence of recurrent or metastatic disease. Nonocclusive pericatheter thrombus/fibrin sheath along patient's  right chest port. 06/06/2018 US venous upper right extremity ultrasound showed nonocclusive thrombus in the right jugular vein.  The port catheter can be seen associated with this thrombus.  The jugular vein is partially occlusive.  There is also partially occlusive thrombus in the right subclavian vein.  The right axillary, brachial, radial, and ulnar veins are compressible.  Doppler analysis demonstrated phasicity of the Doppler waveform. Patient was started on Eliquis for anticoagulation.  02/10/2021, CT chest without contrast showed 6 mm left lower lobe nodule is unchanged.  Likely benign. 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 No definitive findings to suggest locally recurrent disease or metastatic disease in the thorax. 7 x 6 mm left lower lobe pulmonary nodule, stable to slightly decreased in size compared to the prior study, favored to be benign (potentially an area of mucoid impaction).. Other previously noted 6 x 5 mm left lower lobe pulmonary nodule, is stable, likely benign. Mild diffuse bronchial wall thickening with mild centrilobular and paraseptal emphysema; calcifications of the aortic valve.aortic atherosclerosis.  Treatment S/p concurrent Chemotherapy [name]  Weekly x 6] and RT.  Started on maintenance durvalumab every 2 weeks on 12/27/2017. 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 Finish 1 year of Durvalumab [ 26 cycles] on 12/12/2018  08/13/2021 CT chest wo contrast showed 1. Unchanged post treatment/post radiation appearance of the perihilar and suprahilar right upper lobe. No evidence of malignant recurrence.2. Unchanged small nodule of the dependent left lower lobe.3. Diminished small nodule of the dependent left lower lobe, most consistent with resolving nonspecific infection or inflammation.4. Emphysema and diffuse bilateral bronchial wall thickening.  Aortic atherosclerosis.   9 06/01/2021, CT chest abdomen pelvis with contrast showed unchanged posttreatment/postradiation consolidation and fibrosis of the perihilar/Suprahilar right upper lobe.  Unchanged 0.6 cm nodule of the left upper lobe.Previously seen small nodule of the more inferior dependent left lower lobe is resolved.  No new nodules.  No evidence of lymphadenopathy or metastatic disease in the chest abdomen or pelvis.  Emphysema and a diffuse bilateral bronchial wall thickening.  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 female who has above history reviewed by me today presents for follow-up of stage III non-small cell lung cancer She quitted smoking. Patient had surveillance CT scan done.  Present to discuss results.    Chronic shortness of breath unchanged. Patient has no new complaints.  08/06/2022 CT chest without contrast showed stable masslike opacity right suprahilar with retraction and distortion.  6 mm left lower lobe nodule is unchanged.  Emphysematous lung changes.  Aortic atherosclerosis.  Review of Systems  Constitutional:  Negative for appetite change, chills, fatigue and fever.  HENT:   Negative for hearing loss and voice change.   Eyes:  Negative for eye problems.  Respiratory:  Positive for shortness of breath. Negative for chest tightness and cough.  Chronic cough  Cardiovascular:  Negative for chest pain.  Gastrointestinal:  Negative for abdominal distention, abdominal pain and blood in stool.  Endocrine: Negative for hot flashes.  Genitourinary:  Negative for difficulty urinating and frequency.   Musculoskeletal:  Negative for arthralgias.  Skin:  Negative for itching and rash.  Neurological:  Negative for extremity weakness.  Hematological:   Negative for adenopathy.  Psychiatric/Behavioral:  Negative for confusion.     MEDICAL HISTORY:  Past Medical History:  Diagnosis Date   Brain aneurysm    Difficult intubation    Hypertension    Non-small cell cancer of right lung ([place]) 09/22/2017   Chemo + Rad tx's.    Viral meningitis     SURGICAL HISTORY: Past Surgical History:  Procedure Laterality Date   ENDOBRONCHIAL ULTRASOUND N/A 09/09/2017   Procedure: ENDOBRONCHIAL ULTRASOUND;  Surgeon: Laverle Hobby, MD;  Location: [facility];  Service: Pulmonary;  Laterality: N/A;   OOPHORECTOMY Left    PORTA CATH INSERTION N/A 09/29/2017   Procedure: PORTA CATH INSERTION;  Surgeon: Algernon Huxley, MD;  Location: [facility];  Service: Cardiovascular;  Laterality: N/A;   PORTA CATH INSERTION N/A 01/03/2018   Procedure: PORTA CATH INSERTION;  Surgeon: Algernon Huxley, MD;  Location: [facility];  Service: Cardiovascular;  Laterality: N/A;   PORTA CATH REMOVAL N/A 01/02/2019   Procedure: PORTA CATH REMOVAL;  Surgeon: Algernon Huxley, MD;  Location: [facility];  Service: Cardiovascular;  Laterality: N/A;   surgical repair for brain tumor  1969   clips in head   WISDOM TOOTH EXTRACTION      SOCIAL HISTORY: Social History   Socioeconomic History   Marital status: Married    Spouse name: Not on file   Number of children: Not on file   Years of education: Not on file   Highest education level: Not on file  Occupational History   Not on file  Tobacco Use   Smoking status: Former    Packs/day: 0.25    Types: Cigarettes    Quit date: 10/03/2017    Years since quitting: 4.8   Smokeless tobacco: Never  Vaping Use   Vaping Use: Never used  Substance and Sexual Activity   Alcohol use: Not Currently    Comment: social   Drug use: Not Currently    Types: Cocaine   Sexual activity: Not on file  Other Topics Concern   Not on file  Social History Narrative   Not on file   Social Determinants of Health    Financial Resource Strain: Not on file  Food Insecurity: Not on file  Transportation Needs: Not on file  Physical Activity: Not on file  Stress: Not on file  Social Connections: Not on file  Intimate Partner Violence: Not on file    FAMILY HISTORY: Family History  Problem Relation Age of Onset   Diabetes Mother    Hypertension Mother    Hyperlipidemia Mother    Arthritis Mother    Hypertension Father    Hyperlipidemia Father    Heart attack Father        x2   Breast cancer Neg Hx     ALLERGIES:  has No Known Allergies.  MEDICATIONS:  Current Outpatient Medications  Medication Sig Dispense Refill   albuterol (VENTOLIN HFA) 108 (90 Base) MCG/ACT inhaler Inhale into the lungs.     amLODipine (NORVASC) 10 MG tablet Take 10 mg by mouth daily.  11   Cyanocobalamin (B-12)  1000 MCG CAPS Take 1,000 mcg by mouth daily. 30 capsule 3   docusate sodium (COLACE) 100 MG capsule Take 100 mg by mouth daily.     pravastatin (PRAVACHOL) 20 MG tablet Take 1 tablet by mouth at bedtime.     No current facility-administered medications for this visit.     PHYSICAL EXAMINATION: ECOG PERFORMANCE STATUS: 0 - Asymptomatic Vitals:   08/12/22 1408  BP: 136/63  Pulse: 86  Resp: 15  Temp: (!) 96.5 F (35.8 C)  SpO2: 100%   Filed Weights   08/12/22 1408  Weight: 174 lb 9.6 oz (79.2 kg)    Physical Exam Constitutional:      General: She is not in acute distress. HENT:     Head: Normocephalic and atraumatic.  Eyes:     General: No scleral icterus.       Left eye: No discharge.  Cardiovascular:     Rate and Rhythm: Normal rate and regular rhythm.     Heart sounds: No murmur heard. Pulmonary:     Effort: Pulmonary effort is normal. No respiratory distress.     Comments: Decreased breath sound bilaterally Abdominal:     General: Bowel sounds are normal. There is no distension.     Palpations: Abdomen is soft. There is no mass.  Musculoskeletal:        General: No deformity.  Normal range of motion.     Cervical back: Normal range of motion and neck supple.  Lymphadenopathy:     Cervical: No cervical adenopathy.  Skin:    General: Skin is warm and dry.     Findings: No erythema or rash.  Neurological:     Mental Status: She is alert and oriented to person, place, and time. Mental status is at baseline.     Cranial Nerves: No cranial nerve deficit.     Coordination: Coordination normal.  Psychiatric:        Mood and Affect: Mood normal.      LABORATORY DATA:  I have reviewed the data as listed    Latest Ref Rng & Units 08/12/2022    1:53 PM 08/19/2021    2:40 PM 05/12/2021    1:42 PM  CBC  WBC 4.0 - 10.5 K/uL 6.3  6.3  4.7   Hemoglobin 12.0 - 15.0 g/dL 12.6  13.2  14.8   Hematocrit 36.0 - 46.0 % 38.0  38.8  44.0   Platelets 150 - 400 K/uL 277  292  266        Latest Ref Rng & Units 08/12/2022    1:53 PM 02/09/2022    2:59 PM 08/19/2021    2:40 PM  CMP  Glucose 70 - 99 mg/dL 194   142   BUN 8 - 23 mg/dL 7   10   Creatinine 0.44 - 1.00 mg/dL 0.54  0.50  0.55   Sodium 135 - 145 mmol/L 136   137   Potassium 3.5 - 5.1 mmol/L 3.4   4.0   Chloride 98 - 111 mmol/L 103   103   CO2 22 - 32 mmol/L 26   27   Calcium 8.9 - 10.3 mg/dL 9.0   9.4   Total Protein 6.5 - 8.1 g/dL 7.3   7.4   Total Bilirubin 0.3 - 1.2 mg/dL 0.5   0.5   Alkaline Phos 38 - 126 U/L 92   106   AST 15 - 41 U/L 28   26   ALT 0 - 44  U/L 19   23     RADIOGRAPHIC STUDIES: I have personally reviewed the radiological images as listed and agreed with the findings in the report. 12/20/2017 Repeat CT scan showed interval decreased of disease. CT images were Independently reviewed by me and discussed with patient. New 68m right lung lesion, indeterminate.  02/25/2018 CT chest with contrast, presumed evolutionary changes of radiation therapy in the upper right hemithorax.  Aortic atherosclerosis, emphysema. CT Chest Wo Contrast  Result Date: 08/06/2022 CLINICAL DATA:  Lung cancer follow-up.  Restaging right lung non-small-cell lung cancer originally diagnosed in April 2019. Previous radiation and chemotherapy. Currently asymptomatic. * Tracking Code: BO * EXAM: CT CHEST WITHOUT CONTRAST TECHNIQUE: Multidetector CT imaging of the chest was performed following the standard protocol without IV contrast. RADIATION DOSE REDUCTION: This exam was performed according to the departmental dose-optimization program which includes automated exposure control, adjustment of the mA and/or kV according to patient size and/or use of iterative reconstruction technique. COMPARISON:  CT 02/09/2022 and older FINDINGS: Cardiovascular: On this non IV contrast exam, the thoracic aorta has a normal course and caliber with mild scattered atherosclerotic calcified plaque. Calcifications towards the aortic valve and along the coronary arteries. No pericardial effusion. Prominent epicardial fat. Mediastinum/Nodes: Small hiatal hernia. Thyroid gland is unremarkable. No specific abnormal lymph node enlargement seen in the axillary region, hilum or mediastinum on this noncontrast examination. Some small mediastinal nodes are seen, nonpathologic by size criteria including subcarinal and paratracheal. Once again there is some wall thickening seen of the trachea with slight adjacent stranding. Please correlate for any clinical evidence of tracheitis. Lungs/Pleura: Emphysematous lung changes are identified particularly in the upper lung zones. Both paraseptal and centrilobular changes. There is also diffuse areas of bronchiectasis with bronchial wall thickening. There is some debris along the right main bronchus. Left lung has some mild areas of ground-glass. Previously there is a 6 mm nodule in the left lower lobe. On series 3, image 93 today this measures 6 mm. Going back to a study of April 2021 this measured 5 mm. Going back to study of September 2019 this would have measured 6 mm. This demonstrated long-term stability. No specific  imaging follow-up. No pleural effusion or pneumothorax. The right lung once again has upper lobe perihilar masslike opacity with associated narrowing of bronchi and retraction down to the hilum. The extent and distribution of this areas similar to previous examination. Upper Abdomen: Along the upper abdomen the adrenal glands are preserved. Musculoskeletal: Slight curvature of the thoracic spine. Scattered degenerative changes. IMPRESSION: Stable masslike opacity right suprahilar with retraction and distortion. 6 mm left lower lobe nodule is unchanged and has been present since at least 2019 demonstrating long-term stability. Emphysematous lung changes with bronchial wall thickening and bronchiectasis. There is also some wall thickening along the trachea with slight stranding. Please correlate for any clinical evidence of tracheitis. Aortic Atherosclerosis (ICD10-I70.0) and Emphysema (ICD10-J43.9). Electronically Signed   By: AJill SideM.D.   On: 08/06/2022 12:55

## 2022-08-12 NOTE — Assessment & Plan Note (Addendum)
Clinical stage IIIA lung NSCLC, favoring adenocarcinoma, status post concurrent chemotherapy radiation followed by 1 year of durvalumab every 2 weeks. CT scan was independently reviewed and discussed with patient.  No recurrence. She is almost 5 years post for history of concurrent chemoradiation.   Patient prefers to continue follow-up with oncology and continue image surveillance. Recommend to repeat CT scan in a year.

## 2022-10-07 ENCOUNTER — Other Ambulatory Visit: Payer: Self-pay | Admitting: Family Medicine

## 2022-10-07 DIAGNOSIS — Z1231 Encounter for screening mammogram for malignant neoplasm of breast: Secondary | ICD-10-CM

## 2023-05-17 ENCOUNTER — Other Ambulatory Visit: Payer: Self-pay | Admitting: Family Medicine

## 2023-05-17 DIAGNOSIS — Z780 Asymptomatic menopausal state: Secondary | ICD-10-CM

## 2023-07-07 ENCOUNTER — Encounter: Payer: Self-pay | Admitting: Oncology

## 2023-07-26 ENCOUNTER — Encounter: Payer: Self-pay | Admitting: Oncology

## 2023-07-27 ENCOUNTER — Encounter: Payer: Self-pay | Admitting: Emergency Medicine

## 2023-07-27 ENCOUNTER — Emergency Department
Admission: EM | Admit: 2023-07-27 | Discharge: 2023-07-27 | Disposition: A | Discharge status: Home / Self Care | Payer: Medicare Other | Attending: Emergency Medicine | Admitting: Emergency Medicine

## 2023-07-27 ENCOUNTER — Emergency Department: Payer: Medicare Other

## 2023-07-27 ENCOUNTER — Other Ambulatory Visit: Payer: Self-pay

## 2023-07-27 DIAGNOSIS — Z85118 Personal history of other malignant neoplasm of bronchus and lung: Secondary | ICD-10-CM | POA: Diagnosis not present

## 2023-07-27 DIAGNOSIS — M79641 Pain in right hand: Secondary | ICD-10-CM | POA: Diagnosis present

## 2023-07-27 DIAGNOSIS — I10 Essential (primary) hypertension: Secondary | ICD-10-CM | POA: Diagnosis not present

## 2023-07-27 DIAGNOSIS — W010XXA Fall on same level from slipping, tripping and stumbling without subsequent striking against object, initial encounter: Secondary | ICD-10-CM | POA: Insufficient documentation

## 2023-07-27 NOTE — ED Provider Notes (Signed)
 [facility] Provider Note    Event Date/Time   First MD Initiated Contact with Patient 07/27/23 [phone]     (approximate)   History   Fall   HPI  Connie Powers is a 73 y.o. female with PMH of hypertension, brain aneurysm, non-small cell lung cancer presents for evaluation after a fall yesterday.  Patient stepped off the curb wrong and lost her balance.  She did hit her right side of the face on the concrete.  No LOC, no blood thinner use.  Patient denies headaches, blurry vision, facial pain and nausea vomiting.  She is only concerned about her right wrist.  She took some Advil which seemed to improve her pain, she wants to make sure it is not fractured.      Physical Exam   Triage Vital Signs: ED Triage Vitals  Encounter Vitals Group     BP 07/27/23 0729 (!) 183/80     Systolic BP Percentile --      Diastolic BP Percentile --      Pulse Rate 07/27/23 0729 (!) 109     Resp 07/27/23 0729 18     Temp 07/27/23 0729 98.1 F (36.7 C)     Temp Source 07/27/23 0729 Oral     SpO2 07/27/23 0729 97 %     Weight 07/27/23 0728 170 lb (77.1 kg)     Height 07/27/23 0728 5\' 4"  (1.626 m)     Head Circumference --      Peak Flow --      Pain Score 07/27/23 0727 7     Pain Loc --      Pain Education --      Exclude from Growth Chart --     Most recent vital signs: Vitals:   07/27/23 0729  BP: (!) 183/80  Pulse: (!) 109  Resp: 18  Temp: 98.1 F (36.7 C)  SpO2: 97%   General: Awake, no distress.  CV:  Good peripheral perfusion.  Resp:  Normal effort.  Abd:  No distention.  Neuro:  PERRL, EOM intact.  No focal neurodeficits.  No ataxia.  Subconjunctival hemorrhage of the right eye. Other:  Right wrist not significantly swollen when compared to the left, no overlying skin changes or bruising.  Mild tenderness to palpation over the fourth metacarpal.  Wrist ROM maintained, radial pulse 2+ and regular bilaterally.  Sensation intact across all  dermatomes.  Grip strength 5/5.   ED Results / Procedures / Treatments   Labs (all labs ordered are listed, but only abnormal results are displayed) Labs Reviewed - No data to display   RADIOLOGY  Right hand xray obtained, interpreted the images as well as reviewed the radiologist report which was negative for any acute abnormalities but does show some osteoarthritis.   PROCEDURES:  Critical Care performed: No  Procedures   MEDICATIONS ORDERED IN ED: Medications - No data to display   IMPRESSION / MDM / ASSESSMENT AND PLAN / ED COURSE  I reviewed the triage vital signs and the nursing notes.                             73 year old female presents for evaluation of right hand pain after fall.  Patient is hypertensive and tachycardic on presentation but does have history of hypertension.  Vital signs stable otherwise.  Patient NAD on exam.  Differential diagnosis includes, but is not limited to, wrist fracture, wrist  dislocation, wrist strain, intracranial bleed, concussion.  Patient's presentation is most consistent with acute complicated illness / injury requiring diagnostic workup.  I did offer patient a CT scan given that she hit her head.  Patient is not presenting with any signs or symptoms concerning for an intracranial hemorrhage like headache, nausea, vomiting, confusion, vision changes.  Patient declined a CT scan at this time, which I feel is reasonable given lack of symptoms and no focal findings on neuroexam.  Right hand x-ray was negative for fractures but does show some degenerative changes.  Offered wrist brace for comfort.  Patient stated she does not have much pain at this time and declined.  She was advised to continue taking Tylenol and ibuprofen as needed.  We discussed RICE therapy.  She voiced understanding, all questions were answered and she was stable at discharge.      FINAL CLINICAL IMPRESSION(S) / ED DIAGNOSES   Final diagnoses:  Right hand  pain     Rx / DC Orders   ED Discharge Orders     None        Note:  This document was prepared using Dragon voice recognition software and may include unintentional dictation errors.   Cameron Ali, PA-C 07/27/23 [id number]    Phineas Semen, MD 07/27/23 [phone]

## 2023-07-27 NOTE — Discharge Instructions (Signed)
 X-ray of your right hand did not show any fractures but does show some arthritis.  You can take Tylenol and Advil as needed for this.  I also encourage you to ice and elevate the hand as this will improve your swelling and decrease your pain.  Please return to the emergency department if you have any development of severe headache, blurry vision, nausea, vomiting, balance issues or confusion as these are signs of a head bleed.  We did not get a CT scan at the visit today as your neuroexam did not have any abnormalities but you can return to the emergency department at any time for further imaging.

## 2023-07-27 NOTE — ED Notes (Signed)
 See triage note  Presents s/p fall  States she tripped. Hitting her right hand ,knee and right side of face  Has some swelling to right hand

## 2023-07-27 NOTE — ED Triage Notes (Signed)
 Patient to ED via POV after a fall yesterday at the [employer]. Pt states she tripped and fell. C/o of right hand pain/swelling. States she did hit her head but denies LOC or blood thinners. AOx4

## 2023-08-06 ENCOUNTER — Ambulatory Visit: Admission: RE | Admit: 2023-08-06 | Payer: Medicare Other | Source: Ambulatory Visit

## 2023-08-12 ENCOUNTER — Inpatient Hospital Stay: Payer: Medicare HMO | Admitting: Oncology

## 2023-08-12 ENCOUNTER — Inpatient Hospital Stay: Payer: Medicare HMO | Attending: Oncology

## 2023-08-27 ENCOUNTER — Telehealth: Payer: Self-pay

## 2023-08-27 ENCOUNTER — Telehealth: Payer: Self-pay | Admitting: *Deleted

## 2023-08-27 ENCOUNTER — Other Ambulatory Visit: Payer: Self-pay

## 2023-08-27 DIAGNOSIS — C3490 Malignant neoplasm of unspecified part of unspecified bronchus or lung: Secondary | ICD-10-CM

## 2023-08-27 DIAGNOSIS — D7589 Other specified diseases of blood and blood-forming organs: Secondary | ICD-10-CM

## 2023-08-27 NOTE — Telephone Encounter (Signed)
 The patient was calling because cancer center been try to get in on the phone to see what they need. She needs to get scan , she said that she was never told the date of the scan. I told her that sending  a message to the team of You to get set up scan. And call you back

## 2023-08-27 NOTE — Telephone Encounter (Signed)
 Pt scheduled for labs on 3/31 and MD on 4/2. Pt no showed for CT.   Plese r/s CT (ok to do same day as labs). R/s MD to be approx 1 week after lab/ CT. please notify pt of appt details

## 2023-08-30 ENCOUNTER — Inpatient Hospital Stay

## 2023-09-01 ENCOUNTER — Inpatient Hospital Stay: Admitting: Oncology

## 2023-09-03 ENCOUNTER — Ambulatory Visit
Admission: RE | Admit: 2023-09-03 | Discharge: 2023-09-03 | Disposition: A | Discharge status: Home / Self Care | Source: Ambulatory Visit | Attending: Oncology | Admitting: Oncology

## 2023-09-03 ENCOUNTER — Inpatient Hospital Stay: Attending: Oncology

## 2023-09-03 DIAGNOSIS — Z87891 Personal history of nicotine dependence: Secondary | ICD-10-CM | POA: Diagnosis not present

## 2023-09-03 DIAGNOSIS — Z923 Personal history of irradiation: Secondary | ICD-10-CM | POA: Insufficient documentation

## 2023-09-03 DIAGNOSIS — Z9221 Personal history of antineoplastic chemotherapy: Secondary | ICD-10-CM | POA: Insufficient documentation

## 2023-09-03 DIAGNOSIS — C3490 Malignant neoplasm of unspecified part of unspecified bronchus or lung: Secondary | ICD-10-CM | POA: Diagnosis present

## 2023-09-03 DIAGNOSIS — D7589 Other specified diseases of blood and blood-forming organs: Secondary | ICD-10-CM | POA: Insufficient documentation

## 2023-09-03 DIAGNOSIS — Z85118 Personal history of other malignant neoplasm of bronchus and lung: Secondary | ICD-10-CM | POA: Insufficient documentation

## 2023-09-03 LAB — CBC WITH DIFFERENTIAL (CANCER CENTER ONLY)
Abs Immature Granulocytes: 0.02 10*3/uL (ref 0.00–0.07)
Basophils Absolute: 0 10*3/uL (ref 0.0–0.1)
Basophils Relative: 1 %
Eosinophils Absolute: 0.2 10*3/uL (ref 0.0–0.5)
Eosinophils Relative: 3 %
HCT: 37.1 % (ref 36.0–46.0)
Hemoglobin: 12.6 g/dL (ref 12.0–15.0)
Immature Granulocytes: 0 %
Lymphocytes Relative: 24 %
Lymphs Abs: 1.4 10*3/uL (ref 0.7–4.0)
MCH: 35.1 pg — ABNORMAL HIGH (ref 26.0–34.0)
MCHC: 34 g/dL (ref 30.0–36.0)
MCV: 103.3 fL — ABNORMAL HIGH (ref 80.0–100.0)
Monocytes Absolute: 0.4 10*3/uL (ref 0.1–1.0)
Monocytes Relative: 6 %
Neutro Abs: 4 10*3/uL (ref 1.7–7.7)
Neutrophils Relative %: 66 %
Platelet Count: 321 10*3/uL (ref 150–400)
RBC: 3.59 MIL/uL — ABNORMAL LOW (ref 3.87–5.11)
RDW: 12.5 % (ref 11.5–15.5)
WBC Count: 5.9 10*3/uL (ref 4.0–10.5)
nRBC: 0 % (ref 0.0–0.2)

## 2023-09-03 LAB — CMP (CANCER CENTER ONLY)
ALT: 17 U/L (ref 0–44)
AST: 24 U/L (ref 15–41)
Albumin: 3.7 g/dL (ref 3.5–5.0)
Alkaline Phosphatase: 93 U/L (ref 38–126)
Anion gap: 9 (ref 5–15)
BUN: 9 mg/dL (ref 8–23)
CO2: 23 mmol/L (ref 22–32)
Calcium: 8.7 mg/dL — ABNORMAL LOW (ref 8.9–10.3)
Chloride: 105 mmol/L (ref 98–111)
Creatinine: 0.55 mg/dL (ref 0.44–1.00)
GFR, Estimated: >60 mL/min (ref >60)
Glucose, Bld: 180 mg/dL — ABNORMAL HIGH (ref 70–99)
Potassium: 3.8 mmol/L (ref 3.5–5.1)
Sodium: 137 mmol/L (ref 135–145)
Total Bilirubin: 0.6 mg/dL (ref 0.0–1.2)
Total Protein: 7.1 g/dL (ref 6.5–8.1)

## 2023-09-03 LAB — FOLATE: Folate: 10.8 ng/mL (ref >5.9)

## 2023-09-03 LAB — VITAMIN B12: Vitamin B-12: 452 pg/mL (ref 180–914)

## 2023-09-03 MED ORDER — IOHEXOL 300 MG/ML  SOLN
100.0000 mL | Freq: Once | INTRAMUSCULAR | Status: AC | PRN
  Administered 2023-09-03: 100 mL via INTRAVENOUS

## 2023-09-08 ENCOUNTER — Encounter: Payer: Self-pay | Admitting: Oncology

## 2023-09-08 ENCOUNTER — Inpatient Hospital Stay: Admitting: Oncology

## 2023-09-08 VITALS — BP 139/67 | HR 96 | Temp 97.8°F | Resp 18 | Wt 174.9 lb

## 2023-09-08 DIAGNOSIS — D7589 Other specified diseases of blood and blood-forming organs: Secondary | ICD-10-CM

## 2023-09-08 DIAGNOSIS — C3490 Malignant neoplasm of unspecified part of unspecified bronchus or lung: Secondary | ICD-10-CM | POA: Diagnosis not present

## 2023-09-08 DIAGNOSIS — Z85118 Personal history of other malignant neoplasm of bronchus and lung: Secondary | ICD-10-CM | POA: Diagnosis not present

## 2023-09-08 NOTE — Progress Notes (Signed)
 Hematology/Oncology Progress note [phone] [phone]     REASON FOR VISIT Follow-up for stage IIIA non-small cell lung cancer-adenocarcinoma  ASSESSMENT & PLAN:   Non-small cell lung cancer (HCC) Clinical stage IIIA lung NSCLC, favoring adenocarcinoma, status post concurrent chemotherapy radiation followed by 1 year of durvalumab every 2 weeks. CT scan was independently reviewed and discussed with patient.  No recurrence. She is more than 5 years post for history of concurrent chemoradiation.   Patient prefers to continue follow-up with oncology and continue image surveillance. Recommend to repeat CT chest without contrast /lung cancer screening  in a year.   Macrocytosis without anemia She is currently asymptomatic.  Hemoglobin has been stable.  Continue monitor.  Orders Placed This Encounter  Procedures   CT Chest Wo Contrast    Standing Status:   Future    Expected Date:   09/07/2024    Expiration Date:   09/07/2024    Preferred imaging location?:   [facility]   CBC with Differential (Cancer Center Only)    Standing Status:   Future    Expected Date:   09/07/2024    Expiration Date:   09/07/2024   CMP (Cancer Center only)    Standing Status:   Future    Expected Date:   09/07/2024    Expiration Date:   09/07/2024   Vitamin B12    Standing Status:   Future    Expected Date:   09/07/2024    Expiration Date:   09/07/2024   Follow-up in 1 year. All questions were answered. The patient knows to call the clinic with any problems, questions or concerns.  Rickard Patience, MD, PhD [facility] Hematology Oncology 09/08/2023   HISTORY OF PRESENTING ILLNESS:  Connie Powers is a  73 y.o.  female with PMH listed below who was referred to me for evaluation of clinically Stage IIIA non small cell lung cancer.  CT lung cancer screen 05/27/2017   1. A few bilateral pulmonary nodules measuring up to 5 mm are indeterminate. Considering the suspicious 2.0 cm  mediastinal adenopathy, follow up with PET/CT is recommended. 2. Mild bronchial wall thickening could represent airway disease including bronchitis.ACR Lung-RADS Category and Recommendation*: ACR Lung-RADS Category 2S (S modifier for mediastinal adenopathy)  PET scan 3/112019 1. No hypermetabolic pulmonary nodules.2. Hypermetabolic enlarged right paratracheal lymph node, of uncertain etiology in isolation. Lymphoproliferative disorder cannot be excluded. 3.  Aortic atherosclerosis (ICD10-170.0).  EBUS biopsy of paratracheal node showed non small cell lung cancer favoring adenocarcinoma. Case was discussed on tumor board on 09/16/2017 and consensus recommendation is to treat as Stage IIIA disease given the invasion of mediastinum.   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 MRI brain is contraindicated. CT brain negative for metastatic disease.  During the interval patient also was referred to vascular surgery for evaluation of malposition of Medi port.  Patient's right jugular port was removed and right internal jugular vein Mediport was placed by Dr.Dew on 8/5 2019.  # CT chest scan done 06/02/2018 which was independent reviewed by me. CT chest showed radiation changes in the medial right upper lobe/paramediastinal region.  No evidence of recurrent or metastatic disease. Nonocclusive pericatheter thrombus/fibrin sheath along patient's right chest port. 06/06/2018 US venous upper right extremity ultrasound showed nonocclusive thrombus in the right jugular vein.  The port catheter can be seen associated with this thrombus.  The jugular vein is partially occlusive.  There is also partially occlusive thrombus in the right  subclavian vein.  The right axillary, brachial, radial, and ulnar veins are compressible.  Doppler analysis demonstrated phasicity of the Doppler waveform. Patient was started on Eliquis for anticoagulation.  02/10/2021, CT chest without contrast showed 6 mm left lower lobe nodule is  unchanged.  Likely benign. 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 No definitive findings to suggest locally recurrent disease or metastatic disease in the thorax. 7 x 6 mm left lower lobe pulmonary nodule, stable to slightly decreased in size compared to the prior study, favored to be benign (potentially an area of mucoid impaction).. Other previously noted 6 x 5 mm left lower lobe pulmonary nodule, is stable, likely benign. Mild diffuse bronchial wall thickening with mild centrilobular and paraseptal emphysema; calcifications of the aortic valve.aortic atherosclerosis.  Treatment S/p concurrent Chemotherapy Ledell Noss Dorice Lamas  Weekly x 6] and RT.  Started on maintenance durvalumab every 2 weeks on 12/27/2017. 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 Finish 1 year of Durvalumab [ 26 cycles] on 12/12/2018  08/13/2021 CT chest wo contrast showed 1. Unchanged post treatment/post radiation appearance of the perihilar and suprahilar right upper lobe. No evidence of malignant recurrence.2. Unchanged small nodule of the dependent left lower lobe.3. Diminished small nodule of the dependent left lower lobe, most consistent with resolving nonspecific infection or inflammation.4. Emphysema and diffuse bilateral bronchial wall thickening. Aortic atherosclerosis.   9 06/01/2021, CT chest abdomen pelvis with contrast showed unchanged posttreatment/postradiation consolidation and fibrosis of the perihilar/Suprahilar right upper lobe.  Unchanged 0.6 cm nodule of the left upper lobe.Previously seen small nodule of the more inferior dependent left lower lobe is resolved.  No new nodules.  No evidence of lymphadenopathy or metastatic disease in the chest abdomen or pelvis.  Emphysema and a diffuse bilateral bronchial wall thickening.  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  6 mm left lower lobe nodule is unchanged.  Emphysematous lung changes.  Aortic atherosclerosis.  INTERVAL HISTORY Larrie Lucia is a 73 y.o. female who has above history reviewed by me today presents for follow-up of stage III non-small cell lung cancer She quitted smoking. Patient had surveillance CT scan done.  Present to discuss results.    Chronic shortness of breath unchanged. Patient has no new complaints.  09/03/2023 CT chest abdomen pelvis with contrast showed 1. Unchanged post treatment appearance of the suprahilar right lung with dense consolidation and radiation fibrosis. 2. No evidence of lymphadenopathy or metastatic disease in the chest, abdomen, or pelvis. 3. Emphysema and diffuse bilateral bronchial wall thickening.   Aortic Atherosclerosis (ICD10-I70.0) and Emphysema (ICD10-J43.9).   Review of Systems  Constitutional:  Negative for appetite change, chills, fatigue and fever.  HENT:   Negative for hearing loss and voice change.   Eyes:  Negative for eye problems.  Respiratory:  Positive for shortness of breath. Negative for chest tightness and cough.        Chronic cough  Cardiovascular:  Negative for chest pain.  Gastrointestinal:  Negative for abdominal distention, abdominal pain and blood in stool.  Endocrine: Negative for hot flashes.  Genitourinary:  Negative for difficulty urinating and frequency.   Musculoskeletal:  Negative for arthralgias.  Skin:  Negative for itching and rash.  Neurological:  Negative for extremity weakness.  Hematological:  Negative for adenopathy.  Psychiatric/Behavioral:  Negative for confusion.     MEDICAL HISTORY:  Past Medical History:  Diagnosis Date   Brain aneurysm    Difficult  intubation    Hypertension    Non-small cell cancer of right lung (HCC) 09/22/2017   Chemo + Rad tx's.    Viral meningitis     SURGICAL HISTORY: Past Surgical History:  Procedure Laterality Date   ENDOBRONCHIAL ULTRASOUND N/A 09/09/2017   Procedure: ENDOBRONCHIAL ULTRASOUND;  Surgeon: Shane Crutch, MD;  Location: [facility] ORS;  Service: Pulmonary;  Laterality: N/A;   OOPHORECTOMY Left    PORTA CATH INSERTION N/A 09/29/2017   Procedure: PORTA CATH INSERTION;  Surgeon: Annice Needy, MD;  Location: [facility] INVASIVE CV LAB;  Service: Cardiovascular;  Laterality: N/A;   PORTA CATH INSERTION N/A 01/03/2018   Procedure: PORTA CATH INSERTION;  Surgeon: Annice Needy, MD;  Location: [facility] INVASIVE CV LAB;  Service: Cardiovascular;  Laterality: N/A;   PORTA CATH REMOVAL N/A 01/02/2019   Procedure: PORTA CATH REMOVAL;  Surgeon: Annice Needy, MD;  Location: [facility] INVASIVE CV LAB;  Service: Cardiovascular;  Laterality: N/A;   surgical repair for brain tumor  1969   clips in head   WISDOM TOOTH EXTRACTION      SOCIAL HISTORY: Social History   Socioeconomic History   Marital status: Married    Spouse name: Not on file   Number of children: Not on file   Years of education: Not on file   Highest education level: Not on file  Occupational History   Not on file  Tobacco Use   Smoking status: Former    Current packs/day: 0.00    Types: Cigarettes    Quit date: 10/03/2017    Years since quitting: 5.9   Smokeless tobacco: Never  Vaping Use   Vaping status: Never Used  Substance and Sexual Activity   Alcohol use: Not Currently    Comment: social   Drug use: Not Currently    Types: Cocaine   Sexual activity: Not on file  Other Topics Concern   Not on file  Social History Narrative   Not on file   Social Drivers of Health   Financial Resource Strain: Low Risk  (08/13/2022)   Received from [facility], [facility]   Overall Financial Resource Strain (CARDIA)     Difficulty of Paying Living Expenses: Not hard at all  Food Insecurity: No Food Insecurity (08/13/2022)   Received from [facility], [facility]   Hunger Vital Sign    Worried About Running Out of Food in the Last Year: Never true    Ran Out of Food in the Last Year: Never true  Transportation Needs: No Transportation Needs (08/13/2022)   Received from [facility], [facility]   [facility] - Transportation    In the past 12 months, has lack of transportation kept you from medical appointments or from getting medications?: No    Lack of Transportation (Non-Medical): No  Physical Activity: Not on file  Stress: Not on file  Social Connections: Not on file  Intimate Partner Violence: Not on file    FAMILY HISTORY: Family History  Problem Relation Age of Onset   Diabetes Mother    Hypertension Mother    Hyperlipidemia Mother    Arthritis Mother    Hypertension Father    Hyperlipidemia Father    Heart  attack Father        x2   Breast cancer Neg Hx     ALLERGIES:  has no known allergies.  MEDICATIONS:  Current Outpatient Medications  Medication Sig Dispense Refill   amLODipine (NORVASC) 10 MG tablet Take 10 mg by mouth daily.  11   Cyanocobalamin (B-12) 1000 MCG CAPS Take 1,000 mcg by mouth daily. 30 capsule 3   docusate sodium (COLACE) 100 MG capsule Take 100 mg by mouth daily.     pravastatin (PRAVACHOL) 20 MG tablet Take 1 tablet by mouth at bedtime.     No current facility-administered medications for this visit.     PHYSICAL EXAMINATION: ECOG PERFORMANCE STATUS: 0 - Asymptomatic Vitals:   09/08/23 1422 09/08/23 1427  BP: (!) 156/75 139/67  Pulse: 96   Resp: 18   Temp: 97.8 F (36.6 C)   SpO2: 96%    Filed Weights   09/08/23 1422  Weight: 174 lb 14.4 oz (79.3 kg)    Physical Exam Constitutional:      General: She is not in acute distress. HENT:     Head: Normocephalic and atraumatic.   Eyes:     General: No scleral icterus.       Left eye: No discharge.  Cardiovascular:     Rate and Rhythm: Normal rate and regular rhythm.     Heart sounds: No murmur heard. Pulmonary:     Effort: Pulmonary effort is normal. No respiratory distress.     Comments: Decreased breath sound bilaterally Abdominal:     General: Bowel sounds are normal. There is no distension.     Palpations: Abdomen is soft. There is no mass.  Musculoskeletal:        General: No deformity. Normal range of motion.     Cervical back: Normal range of motion and neck supple.  Lymphadenopathy:     Cervical: No cervical adenopathy.  Skin:    General: Skin is warm and dry.     Findings: No erythema or rash.  Neurological:     Mental Status: She is alert and oriented to person, place, and time. Mental status is at baseline.     Cranial Nerves: No cranial nerve deficit.     Coordination: Coordination normal.  Psychiatric:        Mood and Affect: Mood normal.      LABORATORY DATA:  I have reviewed the data as listed    Latest Ref Rng & Units 09/03/2023    2:08 PM 08/12/2022    1:53 PM 08/19/2021    2:40 PM  CBC  WBC 4.0 - 10.5 K/uL 5.9  6.3  6.3   Hemoglobin 12.0 - 15.0 g/dL 45.4  09.8  11.9   Hematocrit 36.0 - 46.0 % 37.1  38.0  38.8   Platelets 150 - 400 K/uL 321  277  292        Latest Ref Rng & Units 09/03/2023    2:08 PM 08/12/2022    1:53 PM 02/09/2022    2:59 PM  CMP  Glucose 70 - 99 mg/dL 147  829    BUN 8 - 23 mg/dL 9  7    Creatinine 5.62 - 1.00 mg/dL 1.30  8.65  7.84   Sodium 135 - 145 mmol/L 137  136    Potassium 3.5 - 5.1 mmol/L 3.8  3.4    Chloride 98 - 111 mmol/L 105  103    CO2 22 - 32 mmol/L 23  26  Calcium 8.9 - 10.3 mg/dL 8.7  9.0    Total Protein 6.5 - 8.1 g/dL 7.1  7.3    Total Bilirubin 0.0 - 1.2 mg/dL 0.6  0.5    Alkaline Phos 38 - 126 U/L 93  92    AST 15 - 41 U/L 24  28    ALT 0 - 44 U/L 17  19      RADIOGRAPHIC STUDIES: I have personally reviewed the radiological  images as listed and agreed with the findings in the report. 12/20/2017 Repeat CT scan showed interval decreased of disease. CT images were Independently reviewed by me and discussed with patient. New 8mm right lung lesion, indeterminate.  02/25/2018 CT chest with contrast, presumed evolutionary changes of radiation therapy in the upper right hemithorax.  Aortic atherosclerosis, emphysema. CT CHEST ABDOMEN PELVIS W CONTRAST Result Date: 09/07/2023 CLINICAL DATA:  Non-small cell lung cancer restaging, status post radiation and chemotherapy * Tracking Code: BO * EXAM: CT CHEST, ABDOMEN, AND PELVIS WITH CONTRAST TECHNIQUE: Multidetector CT imaging of the chest, abdomen and pelvis was performed following the standard protocol during bolus administration of intravenous contrast. RADIATION DOSE REDUCTION: This exam was performed according to the departmental dose-optimization program which includes automated exposure control, adjustment of the mA and/or kV according to patient size and/or use of iterative reconstruction technique. CONTRAST:  OMNIPAQUE IOHEXOL 300 MG/ML  SOLN COMPARISON:  08/05/2022 FINDINGS: CT CHEST FINDINGS Cardiovascular: Aortic atherosclerosis. Central effacement of the right brachiocephalic vein with venous collateralization about the chest wall and internal mammary veins, likely related to chronic indwelling vascular access and/or radiation. Normal heart size. No pericardial effusion. Mediastinum/Nodes: No enlarged mediastinal, hilar, or axillary lymph nodes. Thyroid gland, trachea, and esophagus demonstrate no significant findings. Lungs/Pleura: Mild centrilobular and paraseptal emphysema. Diffuse bilateral bronchial wall thickening. Unchanged post treatment appearance of the suprahilar right lung with dense consolidation and radiation fibrosis (series 4, image 31). Unchanged 0.6 cm nodule of the dependent left lower lobe, stable long-term and benign (series 4, image 82). No pleural effusion  or pneumothorax. Musculoskeletal: No chest wall abnormality. No acute osseous findings. CT ABDOMEN PELVIS FINDINGS Hepatobiliary: No solid liver abnormality is seen. No gallstones, gallbladder wall thickening, or biliary dilatation. Pancreas: Unremarkable. No pancreatic ductal dilatation or surrounding inflammatory changes. Spleen: Normal in size without significant abnormality. Adrenals/Urinary Tract: Adrenal glands are unremarkable. Kidneys are normal, without renal calculi, solid lesion, or hydronephrosis. Bladder is unremarkable. Stomach/Bowel: Stomach is within normal limits. Appendix appears normal. No evidence of bowel wall thickening, distention, or inflammatory changes. Sigmoid diverticulosis. Vascular/Lymphatic: Aortic atherosclerosis. No enlarged abdominal or pelvic lymph nodes. Reproductive: Small uterine fibroids. Other: No abdominal wall hernia or abnormality. No ascites. Musculoskeletal: No acute osseous findings. IMPRESSION: 1. Unchanged post treatment appearance of the suprahilar right lung with dense consolidation and radiation fibrosis. 2. No evidence of lymphadenopathy or metastatic disease in the chest, abdomen, or pelvis. 3. Emphysema and diffuse bilateral bronchial wall thickening. Aortic Atherosclerosis (ICD10-I70.0) and Emphysema (ICD10-J43.9). Electronically Signed   By: Jearld Lesch M.D.   On: 09/07/2023 07:28   DG Hand Complete Right Result Date: 07/27/2023 CLINICAL DATA:  Pain after fall. EXAM: RIGHT HAND - COMPLETE 3+ VIEW COMPARISON:  None Available. FINDINGS: There is no evidence of acute fracture or dislocation. Punctate ossicle adjacent to the ulnar aspect of the fourth middle phalanx likely relates to prior trauma. Degenerative changes of the first through fifth MCP joints, most pronounced at the first and third MCP joints with asymmetric joint space  narrowing and osteophytosis. Mild diffuse interphalangeal joint space narrowing. First CMC joint space narrowing. Soft tissue  swelling along the dorsal hand. No radiopaque foreign body identified. IMPRESSION: 1. No acute osseous abnormality identified. 2. 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 Electronically Signed   By: Hart Robinsons M.D.   On: 07/27/2023 09:13

## 2023-09-08 NOTE — Assessment & Plan Note (Signed)
She is currently asymptomatic.  Hemoglobin has been stable.  Continue monitor.

## 2023-09-08 NOTE — Assessment & Plan Note (Addendum)
 Clinical stage IIIA lung NSCLC, favoring adenocarcinoma, status post concurrent chemotherapy radiation followed by 1 year of durvalumab every 2 weeks. CT scan was independently reviewed and discussed with patient.  No recurrence. She is more than 5 years post for history of concurrent chemoradiation.   Patient prefers to continue follow-up with oncology and continue image surveillance. Recommend to repeat CT chest without contrast /lung cancer screening  in a year.

## 2024-03-31 ENCOUNTER — Encounter: Payer: Self-pay | Admitting: Oncology

## 2024-09-07 ENCOUNTER — Other Ambulatory Visit

## 2024-09-14 ENCOUNTER — Ambulatory Visit: Admitting: Oncology
